# Patient Record
Sex: Female | Born: 1975 | Race: Black or African American | Hispanic: No | Marital: Single | Smoking: Former smoker
Health system: Southern US, Community
[De-identification: ages and names within clinical notes are randomized; demographics above are authoritative.]

## PROBLEM LIST (undated history)

## (undated) DIAGNOSIS — E669 Obesity, unspecified: Secondary | ICD-10-CM

## (undated) DIAGNOSIS — I10 Essential (primary) hypertension: Secondary | ICD-10-CM

## (undated) DIAGNOSIS — F329 Major depressive disorder, single episode, unspecified: Secondary | ICD-10-CM

## (undated) DIAGNOSIS — F32A Depression, unspecified: Secondary | ICD-10-CM

## (undated) DIAGNOSIS — F410 Panic disorder [episodic paroxysmal anxiety] without agoraphobia: Secondary | ICD-10-CM

## (undated) DIAGNOSIS — F319 Bipolar disorder, unspecified: Secondary | ICD-10-CM

## (undated) HISTORY — PX: SIGMOIDECTOMY: SHX176

## (undated) HISTORY — PX: TUBAL LIGATION: SHX77

---

## 2007-10-24 ENCOUNTER — Emergency Department (HOSPITAL_COMMUNITY): Admission: EM | Admit: 2007-10-24 | Discharge: 2007-10-25 | Payer: Self-pay | Admitting: Emergency Medicine

## 2008-04-03 ENCOUNTER — Inpatient Hospital Stay (HOSPITAL_COMMUNITY): Admission: EM | Admit: 2008-04-03 | Discharge: 2008-04-05 | Payer: Self-pay | Admitting: Emergency Medicine

## 2009-01-20 ENCOUNTER — Emergency Department (HOSPITAL_COMMUNITY): Admission: EM | Admit: 2009-01-20 | Discharge: 2009-01-21 | Payer: Self-pay | Admitting: Emergency Medicine

## 2009-07-11 ENCOUNTER — Emergency Department (HOSPITAL_COMMUNITY): Admission: EM | Admit: 2009-07-11 | Discharge: 2009-07-12 | Payer: Self-pay | Admitting: Emergency Medicine

## 2009-08-19 ENCOUNTER — Ambulatory Visit (HOSPITAL_COMMUNITY): Admission: RE | Admit: 2009-08-19 | Discharge: 2009-08-19 | Payer: Self-pay | Admitting: Obstetrics & Gynecology

## 2009-08-20 ENCOUNTER — Ambulatory Visit: Payer: Self-pay | Admitting: Family Medicine

## 2009-09-03 ENCOUNTER — Ambulatory Visit (HOSPITAL_COMMUNITY): Admission: RE | Admit: 2009-09-03 | Discharge: 2009-09-03 | Payer: Self-pay | Admitting: Obstetrics & Gynecology

## 2009-09-03 ENCOUNTER — Ambulatory Visit: Payer: Self-pay | Admitting: Obstetrics and Gynecology

## 2009-09-23 ENCOUNTER — Ambulatory Visit (HOSPITAL_COMMUNITY): Admission: RE | Admit: 2009-09-23 | Discharge: 2009-09-23 | Payer: Self-pay | Admitting: Obstetrics & Gynecology

## 2009-10-05 ENCOUNTER — Inpatient Hospital Stay (HOSPITAL_COMMUNITY): Admission: AD | Admit: 2009-10-05 | Discharge: 2009-10-05 | Payer: Self-pay | Admitting: Obstetrics & Gynecology

## 2009-10-08 ENCOUNTER — Ambulatory Visit: Payer: Self-pay | Admitting: Family Medicine

## 2009-10-22 ENCOUNTER — Ambulatory Visit (HOSPITAL_COMMUNITY): Admission: RE | Admit: 2009-10-22 | Discharge: 2009-10-22 | Payer: Self-pay | Admitting: Obstetrics & Gynecology

## 2009-10-22 ENCOUNTER — Ambulatory Visit: Payer: Self-pay | Admitting: Obstetrics & Gynecology

## 2009-10-22 LAB — CONVERTED CEMR LAB
AST: 14 units/L (ref 0–37)
Alkaline Phosphatase: 61 units/L (ref 39–117)
CO2: 23 meq/L (ref 19–32)
Creatinine Clearance: 244 mL/min — ABNORMAL HIGH (ref 75–115)
Creatinine, Ser: 0.56 mg/dL (ref 0.40–1.20)
Creatinine, Urine: 121.3 mg/dL
Glucose, Bld: 88 mg/dL (ref 70–99)
HCT: 33.2 % — ABNORMAL LOW (ref 36.0–46.0)
Hemoglobin: 11 g/dL — ABNORMAL LOW (ref 12.0–15.0)
MCHC: 33.1 g/dL (ref 30.0–36.0)
MCV: 95.4 fL (ref 78.0–100.0)
Protein, Ur: 228 mg/24hr — ABNORMAL HIGH (ref 50–100)
RBC: 3.48 M/uL — ABNORMAL LOW (ref 3.87–5.11)
Sodium: 138 meq/L (ref 135–145)
Total Bilirubin: 0.4 mg/dL (ref 0.3–1.2)
WBC: 11.7 10*3/uL — ABNORMAL HIGH (ref 4.0–10.5)

## 2009-11-19 ENCOUNTER — Ambulatory Visit: Payer: Self-pay | Admitting: Obstetrics & Gynecology

## 2009-11-19 ENCOUNTER — Ambulatory Visit (HOSPITAL_COMMUNITY): Admission: RE | Admit: 2009-11-19 | Discharge: 2009-11-19 | Payer: Self-pay | Admitting: Obstetrics & Gynecology

## 2009-11-26 ENCOUNTER — Ambulatory Visit: Payer: Self-pay | Admitting: Obstetrics & Gynecology

## 2009-11-26 LAB — CONVERTED CEMR LAB
HCT: 31.6 % — ABNORMAL LOW (ref 36.0–46.0)
Hemoglobin: 10.6 g/dL — ABNORMAL LOW (ref 12.0–15.0)
MCHC: 33.5 g/dL (ref 30.0–36.0)
RBC: 3.36 M/uL — ABNORMAL LOW (ref 3.87–5.11)
RDW: 14.4 % (ref 11.5–15.5)
WBC: 11.1 10*3/uL — ABNORMAL HIGH (ref 4.0–10.5)

## 2009-12-10 ENCOUNTER — Ambulatory Visit: Payer: Self-pay | Admitting: Family Medicine

## 2009-12-10 ENCOUNTER — Encounter: Payer: Self-pay | Admitting: Pulmonary Disease

## 2009-12-17 ENCOUNTER — Ambulatory Visit: Payer: Self-pay | Admitting: Obstetrics & Gynecology

## 2009-12-21 ENCOUNTER — Ambulatory Visit (HOSPITAL_COMMUNITY): Admission: RE | Admit: 2009-12-21 | Discharge: 2009-12-21 | Payer: Self-pay | Admitting: Obstetrics & Gynecology

## 2009-12-23 ENCOUNTER — Ambulatory Visit: Payer: Self-pay | Admitting: Pulmonary Disease

## 2009-12-23 DIAGNOSIS — I10 Essential (primary) hypertension: Secondary | ICD-10-CM | POA: Insufficient documentation

## 2009-12-23 DIAGNOSIS — J45909 Unspecified asthma, uncomplicated: Secondary | ICD-10-CM | POA: Insufficient documentation

## 2009-12-23 DIAGNOSIS — F319 Bipolar disorder, unspecified: Secondary | ICD-10-CM | POA: Insufficient documentation

## 2009-12-31 ENCOUNTER — Ambulatory Visit: Payer: Self-pay | Admitting: Family Medicine

## 2010-01-04 ENCOUNTER — Ambulatory Visit: Payer: Self-pay | Admitting: Obstetrics & Gynecology

## 2010-01-04 ENCOUNTER — Ambulatory Visit (HOSPITAL_COMMUNITY): Admission: RE | Admit: 2010-01-04 | Discharge: 2010-01-04 | Payer: Self-pay | Admitting: Family Medicine

## 2010-01-07 ENCOUNTER — Ambulatory Visit: Payer: Self-pay | Admitting: Obstetrics & Gynecology

## 2010-01-11 ENCOUNTER — Ambulatory Visit: Payer: Self-pay | Admitting: Obstetrics & Gynecology

## 2010-01-14 ENCOUNTER — Ambulatory Visit: Payer: Self-pay | Admitting: Obstetrics & Gynecology

## 2010-01-19 ENCOUNTER — Ambulatory Visit (HOSPITAL_COMMUNITY): Admission: RE | Admit: 2010-01-19 | Discharge: 2010-01-19 | Payer: Self-pay | Admitting: Family Medicine

## 2010-01-22 ENCOUNTER — Ambulatory Visit (HOSPITAL_BASED_OUTPATIENT_CLINIC_OR_DEPARTMENT_OTHER): Admission: RE | Admit: 2010-01-22 | Discharge: 2010-01-22 | Payer: Self-pay | Admitting: Pulmonary Disease

## 2010-01-22 ENCOUNTER — Ambulatory Visit: Payer: Self-pay | Admitting: Pulmonary Disease

## 2010-01-22 DIAGNOSIS — G4733 Obstructive sleep apnea (adult) (pediatric): Secondary | ICD-10-CM | POA: Insufficient documentation

## 2010-01-25 ENCOUNTER — Ambulatory Visit: Payer: Self-pay | Admitting: Obstetrics & Gynecology

## 2010-01-28 ENCOUNTER — Ambulatory Visit: Payer: Self-pay | Admitting: Obstetrics and Gynecology

## 2010-02-01 ENCOUNTER — Ambulatory Visit: Payer: Self-pay | Admitting: Obstetrics & Gynecology

## 2010-02-01 ENCOUNTER — Telehealth (INDEPENDENT_AMBULATORY_CARE_PROVIDER_SITE_OTHER): Payer: Self-pay | Admitting: *Deleted

## 2010-02-03 ENCOUNTER — Encounter: Payer: Self-pay | Admitting: Pulmonary Disease

## 2010-02-08 ENCOUNTER — Ambulatory Visit: Payer: Self-pay | Admitting: Pulmonary Disease

## 2010-02-09 ENCOUNTER — Ambulatory Visit: Payer: Self-pay | Admitting: Obstetrics and Gynecology

## 2010-02-11 ENCOUNTER — Encounter (INDEPENDENT_AMBULATORY_CARE_PROVIDER_SITE_OTHER): Payer: Self-pay | Admitting: Family Medicine

## 2010-02-11 ENCOUNTER — Ambulatory Visit: Payer: Self-pay | Admitting: Obstetrics and Gynecology

## 2010-02-11 LAB — CONVERTED CEMR LAB: Chlamydia, DNA Probe: NEGATIVE

## 2010-02-12 ENCOUNTER — Encounter (INDEPENDENT_AMBULATORY_CARE_PROVIDER_SITE_OTHER): Payer: Self-pay | Admitting: Family Medicine

## 2010-02-15 ENCOUNTER — Ambulatory Visit: Payer: Self-pay | Admitting: Obstetrics & Gynecology

## 2010-02-16 ENCOUNTER — Ambulatory Visit (HOSPITAL_COMMUNITY): Admission: RE | Admit: 2010-02-16 | Discharge: 2010-02-16 | Payer: Self-pay | Admitting: Family Medicine

## 2010-02-16 ENCOUNTER — Encounter: Payer: Self-pay | Admitting: Family Medicine

## 2010-02-17 ENCOUNTER — Encounter: Payer: Self-pay | Admitting: Pulmonary Disease

## 2010-02-18 ENCOUNTER — Ambulatory Visit: Payer: Self-pay | Admitting: Family Medicine

## 2010-02-20 ENCOUNTER — Inpatient Hospital Stay (HOSPITAL_COMMUNITY): Admission: AD | Admit: 2010-02-20 | Discharge: 2010-02-20 | Payer: Self-pay | Admitting: Obstetrics & Gynecology

## 2010-02-22 ENCOUNTER — Ambulatory Visit: Payer: Self-pay | Admitting: Obstetrics & Gynecology

## 2010-02-23 ENCOUNTER — Ambulatory Visit: Payer: Self-pay | Admitting: Obstetrics and Gynecology

## 2010-02-23 ENCOUNTER — Inpatient Hospital Stay (HOSPITAL_COMMUNITY): Admission: RE | Admit: 2010-02-23 | Discharge: 2010-02-26 | Payer: Self-pay | Admitting: Obstetrics & Gynecology

## 2010-02-24 ENCOUNTER — Encounter: Payer: Self-pay | Admitting: Obstetrics & Gynecology

## 2010-03-02 ENCOUNTER — Ambulatory Visit: Payer: Self-pay | Admitting: Advanced Practice Midwife

## 2010-03-02 ENCOUNTER — Inpatient Hospital Stay (HOSPITAL_COMMUNITY): Admission: AD | Admit: 2010-03-02 | Discharge: 2010-03-03 | Payer: Self-pay | Admitting: Obstetrics & Gynecology

## 2010-03-04 ENCOUNTER — Ambulatory Visit: Payer: Self-pay | Admitting: Family Medicine

## 2010-03-04 ENCOUNTER — Inpatient Hospital Stay (HOSPITAL_COMMUNITY): Admission: AD | Admit: 2010-03-04 | Discharge: 2010-03-04 | Payer: Self-pay | Admitting: Family Medicine

## 2010-03-08 ENCOUNTER — Encounter: Payer: Self-pay | Admitting: Pulmonary Disease

## 2010-04-12 ENCOUNTER — Telehealth: Payer: Self-pay | Admitting: Pulmonary Disease

## 2010-04-13 ENCOUNTER — Encounter: Payer: Self-pay | Admitting: Pulmonary Disease

## 2010-08-08 ENCOUNTER — Encounter: Payer: Self-pay | Admitting: *Deleted

## 2010-08-08 ENCOUNTER — Encounter: Payer: Self-pay | Admitting: Obstetrics & Gynecology

## 2010-08-17 NOTE — Miscellaneous (Signed)
Summary: CPAP download  Clinical Lists Changes Used on 11 of 15 nights with average 4hrs 8 min.  Average AHI 1.3.

## 2010-08-17 NOTE — Assessment & Plan Note (Signed)
Summary: rov for sleep per Prestina Raigoza/LC   Visit Type:  Follow-up Copy to:  [facility] Primary Provider/Referring Provider:  Dr. Jeri Cos  CC:  Patient is here for cpap follow-up. The patient is 38 weeks into her pregnancy and date for induction is August 9th. The patient says there was a problem with the cpap and DME company came out to adjust. .  History of Present Illness: 35 yo female in 86 week pregnancy with hypersomnia and OSA.  She had her sleep study on January 22, 2010: AHI 5, SpO2 nadir 88%.  REM AHI 33.6.  She has since been started on autoCPAP.  She has full face mask.  She has adjusted well to using CPAP, and feels more refreshed with better sleep.  She is not having much trouble with the mask.   Current Medications (verified): 1)  Labetalol Hcl 100 Mg Tabs (Labetalol Hcl) .... Take 1 Tablet By Mouth Two Times A Day 2)  Vinate Ic 162-115.2-1 Mg Caps (Prenat W/o A Vit-Fefum-Fepo-Fa) .... 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 None  Vital Signs:  Patient profile:   35 year old female Height:      63.25 inches (160.66 cm) Weight:      342 pounds (155.45 kg) BMI:     60.32 O2 Sat:      100 % on Room air Temp:     97.5 degrees F (36.39 degrees C) oral Pulse rate:   116 / minute BP sitting:   144 / 88  (left arm) Cuff size:   large  Vitals Entered By: Michel Bickers CMA (February 08, 2010 9:17 AM)  O2 Sat at Rest %:  100 O2 Flow:  Room air CC: Patient is here for cpap follow-up. The patient is 38 weeks into her pregnancy and date for induction is August 9th. The patient says there was a problem with the cpap and DME  company came out to adjust.    Physical Exam  General:  normal appearance, healthy appearing, and obese.   Nose:  no deformity, discharge, inflammation, or lesions Mouth:  MP3, no exudate Neck:  no JVD.   Lungs:  clear bilaterally to auscultation and percussion Heart:  regular rate and rhythm, S1, S2 without murmurs, rubs, gallops, or clicks Extremities:  minimal ankle edema Cervical Nodes:  no significant adenopathy   Impression & Recommendations:  Problem # 1:  OBSTRUCTIVE SLEEP APNEA (ICD-327.23)  I reviewed her sleep test with her.  She has done well with her initial set up of auto CPAP.  I reviewed the proper fit of her mask.  Explained the importance of using CPAP through the remainder of her pregnancy with regard to her blood pressure and concern about possible pre-eclampsia.  Will re-address her need for CPAP several weeks after her delivery.  Explained that she needs to try to use CPAP whenever she is asleep, including when she is taking a nap during the day.  Complete Medication List: 1)  Labetalol Hcl 100 Mg Tabs (Labetalol hcl) .... Take 1 tablet by mouth two times a day 2)  Vinate Ic 162-115.2-1 Mg Caps (Prenat w/o a vit-fefum-fepo-fa) .Marland KitchenMarland KitchenMarland Kitchen  Take 1 tablet by mouth once a day  Other Orders: Est. Patient Level III (60454)  Patient Instructions: 1)  Follow up in 6 to 8 weeks

## 2010-08-17 NOTE — Progress Notes (Signed)
Summary: OV re: cpap with Sood this week  ---- Converted from flag ---- ---- 01/29/2010 4:41 PM, Coralyn Helling MD wrote: Can you set Ms. Singleton up for an ROV with me toward end of next week.  She is [redacted] weeks pregnant and new CPAP set up.  I want to be able to speak to her before her delivery.  Thanks. ------------------------------  Phone Note Outgoing Call   Call placed by: Michel Bickers CMA,  February 01, 2010 10:52 AM Call placed to: Patient Summary of Call: Dr. Craige Cotta wants to see this patient this week re: cpap before she delivers. LMOMTCB. Initial call taken by: Michel Bickers CMA,  February 01, 2010 10:53 AM  Follow-up for Phone Call        [facility].Michel Bickers [facility]  February 02, 2010 3:32 PM  The patient has not received CPAP yet. The order was given to [facility] on 02/01/2010. I spoke with [facility] and asked that they get this setup for the pt ASAP. Once 2 week download is complete the patient will be approx [redacted] weeks pregnant. Please advise on what you would like to do. Patient is aware CPAP setup should be done in the next few days. Follow-up by: Michel Bickers CMA,  February 02, 2010 3:58 PM  Additional Follow-up for Phone Call Additional follow up Details #1::        Can you schedule for ROV next week with me.  Okay to overbook if needed. Additional Follow-up by: Coralyn Helling MD,  February 03, 2010 9:08 AM    Additional Follow-up for Phone Call Additional follow up Details #2::    LMOMTCB.Michel Bickers [facility]  February 03, 2010 9:36 AM  Patient is sch for Monday, 02/08/2010 @ 9am with VS. Patient is aware. Follow-up by: Michel Bickers CMA,  February 03, 2010 4:48 PM

## 2010-08-17 NOTE — Miscellaneous (Signed)
Summary: CPAP Set up info/[facility]  CPAP Set up info/[facility]   Imported By: Sherian Rein 02/11/2010 09:55:12  _____________________________________________________________________  External Attachment:    Type:   Image     Comment:   External Document

## 2010-08-17 NOTE — Assessment & Plan Note (Signed)
Summary: SLEEP AP- PT IS [redacted] WKS PREGNANT//kp   Visit Type:  Initial Consult Copy to:  [facility] Primary Provider/Referring Provider:  Dr. Jeri Cos  CC:  Pt c/o choking worse when sleeping x years. Pt is [redacted] weeks pregnant.  History of Present Illness: 35 yo female for sleep evaluation.  He has noticed problems with her sleep for years.  She also has a history of hypertension.  She is now [redacted] weeks pregnant, and her sleep has gotten worse during the pregnancy.  This is her second pregnancy.  She goes to bed at 11pm.  She was using trazodone prior to her pregnacy.  She is not using anything to help her sleep now.  She wakes up several times during the night with a choking sensation.  She gets out of bed at 730am, and still feels tired.  She does not usually get headaches in the morning.  She used to drink about one pot of coffee a day before she was pregnant.  She does snore, and her mother has told her that she stops breathing while asleep.  Both her parents have sleep apnea.  She gets a dry mouth at night, and can't sleep on her back because she gets choked.  She denies sleep walking, sleep talking, nightmares, or bruxism.  She denies restless legs.  There is no history of sleep hallucinations, sleep paralysis, or cataplexy.  She has gained about 25 lbs during her pregnancy.  Her blood pressure has been controlled during her pregnancy, and she has not had any other complications during her pregnancy.  She also has a history of bipolar disorder.  She was using seroquel and lamictal, but has stopped both of these since she became pregnant.  She is followed by Consuello Bossier with behavioral health.  Her Epworth score is 11 out of 24.  Preventive Screening-Counseling & Management  Alcohol-Tobacco     Smoking Status: quit     Packs/Day: 0.5     Year Started: 2007     Year Quit: 2011  Current Medications (verified): 1)  Labetalol Hcl 100 Mg Tabs (Labetalol Hcl) .... Take 1 Tablet  By Mouth Two Times A Day 2)  Vinate Ic 162-115.2-1 Mg Caps (Prenat W/o A Vit-Fefum-Fepo-Fa) .... 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 (quit 07-09-2009 1/2 ppd, started 2007) Smoking Status:  quit Packs/Day:  0.5  Review of Systems       The patient complains of shortness of breath at rest and hand/feet swelling.  The patient denies shortness of breath with activity, productive cough, non-productive cough, coughing up blood, chest pain, irregular heartbeats, acid heartburn, indigestion, loss of appetite, weight change, abdominal pain, difficulty swallowing, sore throat, tooth/dental problems, headaches, nasal congestion/difficulty breathing through nose, sneezing, itching, ear ache, anxiety, depression, joint stiffness or pain, rash, change in color of mucus, and fever.    Vital Signs:  Patient profile:   35 year old female Height:      63.25 inches Weight:      335 pounds BMI:     59.09 O2 Sat:      97 % on Room air Temp:     98.8 degrees  F oral Pulse rate:   108 / minute BP sitting:   122 / 78  (left arm) Cuff size:   large  Vitals Entered By: Zackery Barefoot CMA (December 23, 2009 2:21 PM)  O2 Flow:  Room air CC: Pt c/o choking worse when sleeping x years. Pt is [redacted] weeks pregnant Comments Medications reviewed with patient Verified contact number and pharmacy with patient Zackery Barefoot CMA  December 23, 2009 2:23 PM    Physical Exam  General:  normal appearance, healthy appearing, and obese.   Eyes:  EOMI.   Nose:  no deformity, discharge,  inflammation, or lesions Mouth:  MP3, no exudate Neck:  no JVD.   Chest Wall:  no deformities noted Lungs:  clear bilaterally to auscultation and percussion Heart:  regular rate and rhythm, S1, S2 without murmurs, rubs, gallops, or clicks Abdomen:  gravid uterus, non-tender, normal bowel sounds Msk:  no deformity or scoliosis noted with normal posture Pulses:  pulses normal Extremities:  no clubbing, cyanosis, edema, or deformity noted Neurologic:  CN II-XII grossly intact with normal reflexes, coordination, muscle strength and tone Cervical Nodes:  no significant adenopathy Psych:  alert and cooperative; normal mood and affect; normal attention span and concentration   Impression & Recommendations:  Problem # 1:  HYPERSOMNIA (ICD-780.54)  She has sleep disruption and excessive daytime sleepiness.  She has hypertension and bipolar disease.  She is in her 30th week of pregnancy.  I am concerned that she could have sleep apnea.  To further evaluate this I will arrange for an overnight polysomnogram.    I have explained to her how sleep apnea can affect her health, particularly with regard to her blood pressure and risk of pre-eclampsia during pregnancy.  Driving precautions were discussed.  Advised her how her weight is likely contributing to her sleep problems, and that this will need to be addressed further after her pregnancy.  Further recommendations to be made after review of her sleep test.  Medications Added to Medication List This Visit: 1)  Labetalol Hcl 100 Mg Tabs (Labetalol hcl) .... Take 1 tablet by mouth two times a day 2)  Vinate Ic 162-115.2-1 Mg Caps (Prenat w/o a vit-fefum-fepo-fa) .... Take 1 tablet by mouth once a day  Complete Medication List: 1)  Labetalol Hcl 100 Mg Tabs (Labetalol hcl) .... Take 1 tablet by mouth two times a day 2)  Vinate Ic 162-115.2-1 Mg Caps (Prenat w/o a vit-fefum-fepo-fa) .... Take 1 tablet by mouth once a day  Other  Orders: Consultation Level IV (16109) Sleep Disorder Referral (Sleep Disorder)  Patient Instructions: 1)  Will schedule sleep test 2)  Will call to schedule follow up after sleep test reviewed   Immunization History:  Influenza Immunization History:    Influenza:  historical (07/20/2009)

## 2010-08-17 NOTE — Miscellaneous (Signed)
Summary: Polysomnogram report  Clinical Lists Changes AHI 5, SpO2 nadir 88%.  REM AHI 33.6.  Pt is [redacted] weeks pregnant, has history of HTN, and Ob states concern for pre-eclampsia.  Will start her on empiric auto CPAP to get her through her pregnancy, and then re-assess need for CPAP after her delivery.  Plan d/w pt over the phone. Problems: Added new problem of OBSTRUCTIVE SLEEP APNEA (ICD-327.23) Orders: Added new Referral order of DME Referral (DME) - Signed

## 2010-08-17 NOTE — Letter (Signed)
Summary: [facility]   Imported By: Sherian Rein 12/28/2009 10:35:01  _____________________________________________________________________  External Attachment:    Type:   Image     Comment:   External Document

## 2010-08-17 NOTE — Progress Notes (Signed)
Summary: nos appt  Phone Note Call from Patient   Caller: [email or web address]  Call For: sood Summary of Call: LMTCB x2 to rsc nos from 9/22. Initial call taken by: Darletta Moll,  April 12, 2010 11:05 AM

## 2010-10-01 LAB — URINALYSIS, ROUTINE W REFLEX MICROSCOPIC
Bilirubin Urine: NEGATIVE
Bilirubin Urine: NEGATIVE
Bilirubin Urine: NEGATIVE
Glucose, UA: NEGATIVE mg/dL
Glucose, UA: NEGATIVE mg/dL
Glucose, UA: NEGATIVE mg/dL
Hgb urine dipstick: NEGATIVE
Ketones, ur: 15 mg/dL — AB
Ketones, ur: NEGATIVE mg/dL
Leukocytes, UA: NEGATIVE
Nitrite: NEGATIVE
Protein, ur: 100 mg/dL — AB
Protein, ur: NEGATIVE mg/dL
Specific Gravity, Urine: 1.02 (ref 1.005–1.030)
Specific Gravity, Urine: >1.03 — ABNORMAL HIGH (ref 1.005–1.030)
Specific Gravity, Urine: >1.03 — ABNORMAL HIGH (ref 1.005–1.030)
Urobilinogen, UA: 0.2 mg/dL (ref 0.0–1.0)
pH: 6 (ref 5.0–8.0)
pH: 6 (ref 5.0–8.0)

## 2010-10-01 LAB — CBC
HCT: 28.8 % — ABNORMAL LOW (ref 36.0–46.0)
HCT: 27 % — ABNORMAL LOW (ref 36.0–46.0)
HCT: 32.6 % — ABNORMAL LOW (ref 36.0–46.0)
Hemoglobin: 9.4 g/dL — ABNORMAL LOW (ref 12.0–15.0)
Hemoglobin: 11.2 g/dL — ABNORMAL LOW (ref 12.0–15.0)
Hemoglobin: 9.4 g/dL — ABNORMAL LOW (ref 12.0–15.0)
MCH: 32.6 pg (ref 26.0–34.0)
MCH: 33.4 pg (ref 26.0–34.0)
MCHC: 34.7 g/dL (ref 30.0–36.0)
MCV: 95.1 fL (ref 78.0–100.0)
RBC: 2.98 MIL/uL — ABNORMAL LOW (ref 3.87–5.11)
RBC: 2.8 MIL/uL — ABNORMAL LOW (ref 3.87–5.11)
RBC: 3.43 MIL/uL — ABNORMAL LOW (ref 3.87–5.11)
WBC: 6.6 10*3/uL (ref 4.0–10.5)
WBC: 8.2 10*3/uL (ref 4.0–10.5)

## 2010-10-01 LAB — POCT URINALYSIS DIPSTICK
Glucose, UA: NEGATIVE mg/dL
Hgb urine dipstick: NEGATIVE
Nitrite: NEGATIVE
Specific Gravity, Urine: 1.025 (ref 1.005–1.030)
pH: 5.5 (ref 5.0–8.0)

## 2010-10-01 LAB — DIFFERENTIAL
Basophils Relative: 0 % (ref 0–1)
Eosinophils Absolute: 0.2 10*3/uL (ref 0.0–0.7)
Eosinophils Relative: 3 % (ref 0–5)
Neutrophils Relative %: 60 % (ref 43–77)

## 2010-10-01 LAB — URINE MICROSCOPIC-ADD ON

## 2010-10-01 LAB — COMPREHENSIVE METABOLIC PANEL
ALT: 25 U/L (ref 0–35)
AST: 21 U/L (ref 0–37)
Alkaline Phosphatase: 76 U/L (ref 39–117)
CO2: 25 mEq/L (ref 19–32)
Chloride: 110 mEq/L (ref 96–112)
GFR calc Af Amer: >60 mL/min (ref >60)
GFR calc non Af Amer: >60 mL/min (ref >60)
Glucose, Bld: 85 mg/dL (ref 70–99)
Potassium: 3.9 mEq/L (ref 3.5–5.1)
Sodium: 142 mEq/L (ref 135–145)

## 2010-10-03 LAB — POCT URINALYSIS DIP (DEVICE)
Bilirubin Urine: NEGATIVE
Glucose, UA: NEGATIVE mg/dL
Hgb urine dipstick: NEGATIVE
Hgb urine dipstick: NEGATIVE
Ketones, ur: NEGATIVE mg/dL
Nitrite: NEGATIVE
Nitrite: NEGATIVE
Protein, ur: NEGATIVE mg/dL
Specific Gravity, Urine: >=1.03 (ref 1.005–1.030)
Urobilinogen, UA: 0.2 mg/dL (ref 0.0–1.0)
pH: 5.5 (ref 5.0–8.0)
pH: 6.5 (ref 5.0–8.0)

## 2010-10-03 IMAGING — US US OB DETAIL+14 WK
1 series · 14 of 28 positions shown · non-contrast
Comparison: none

OBSTETRICAL ULTRASOUND:
 This ultrasound was performed in The [HOSPITAL], and the [facility] report will be stored to [REDACTED] PACS.  This report is also available in [HOSPITAL]?s accessANYware.

[Series 1: us ob detail+14 wk · 14 of 89 slices shown]
[im 4/89]
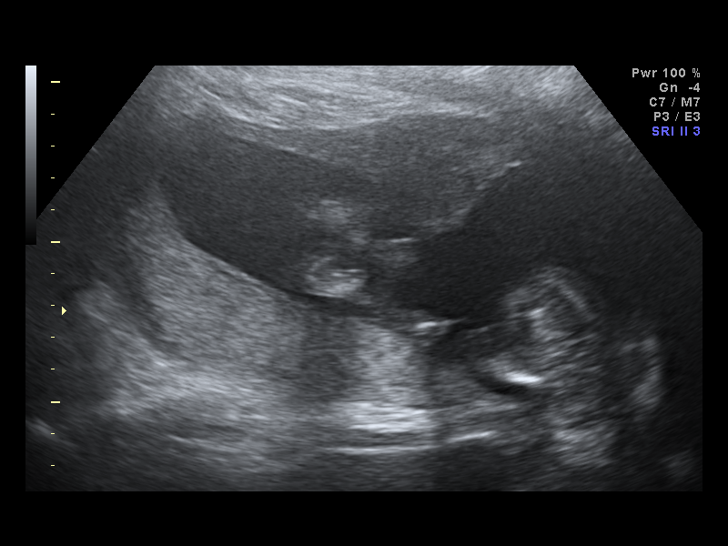
[im 10/89]
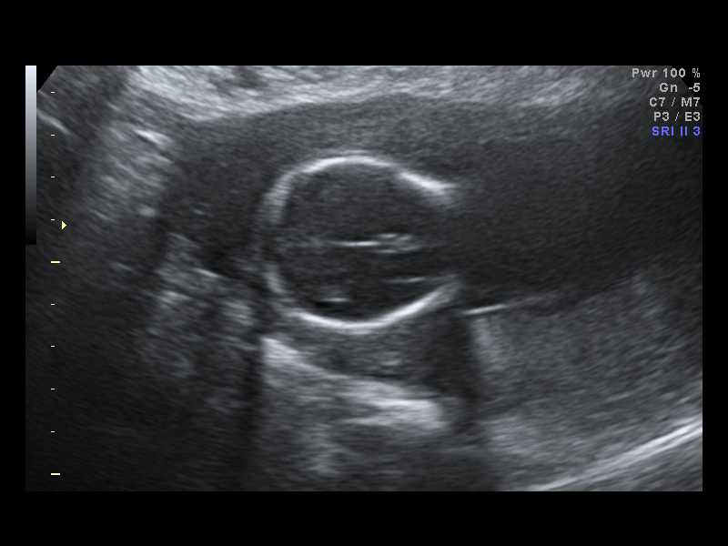
[im 17/89]
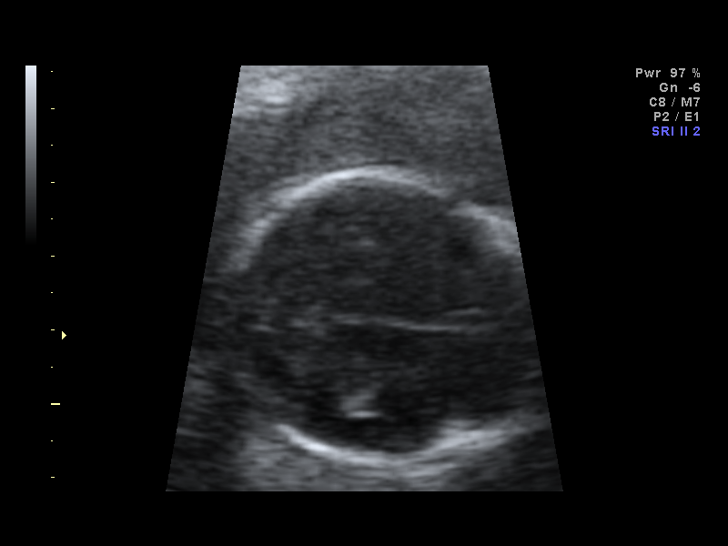
[im 23/89]
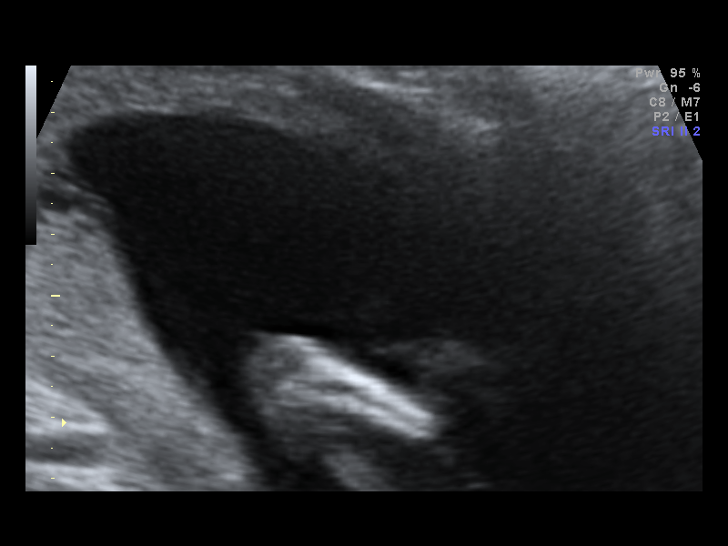
[im 30/89]
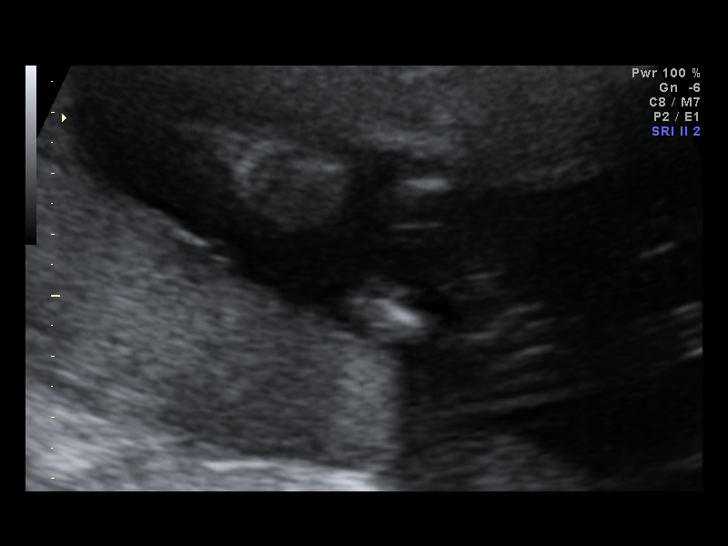
[im 36/89]
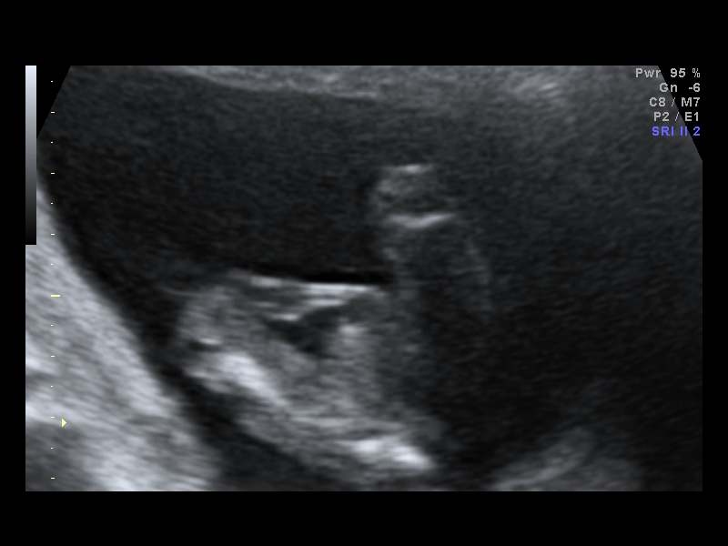
[im 43/89]
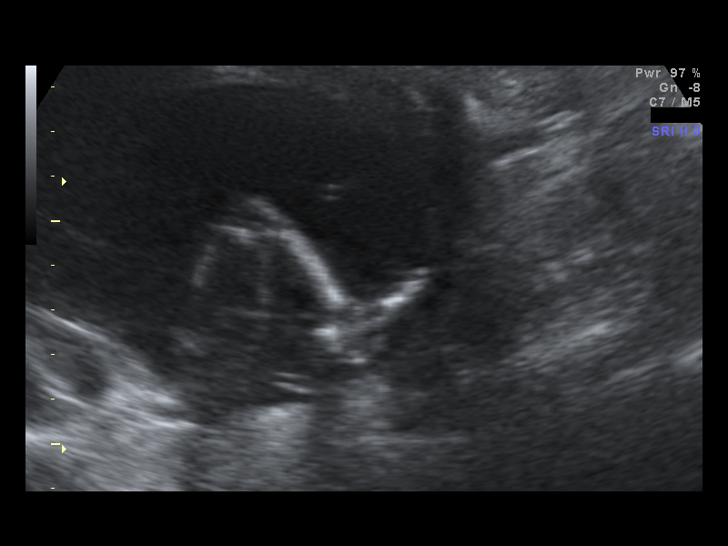
[im 49/89]
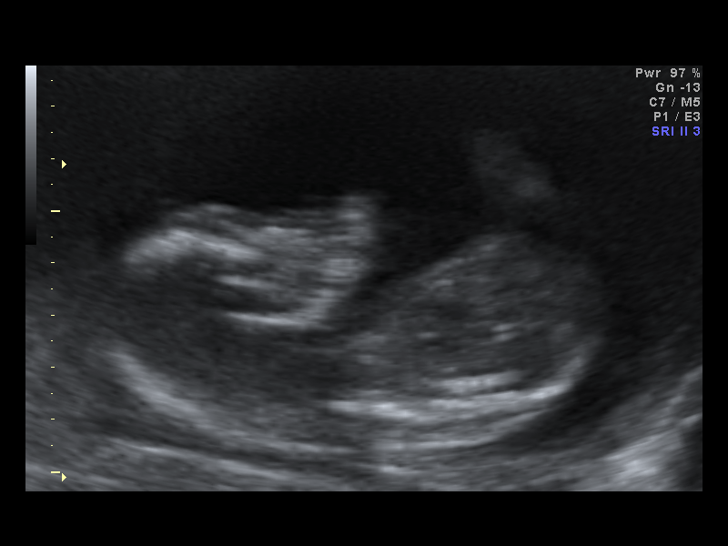
[im 56/89]
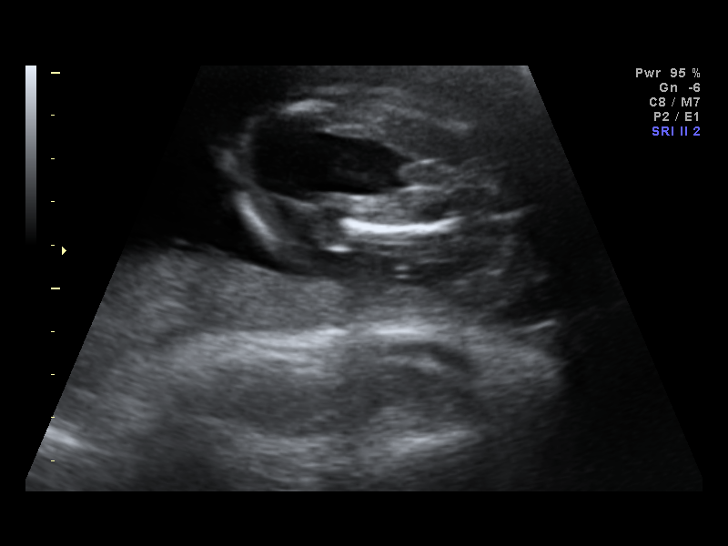
[im 62/89]
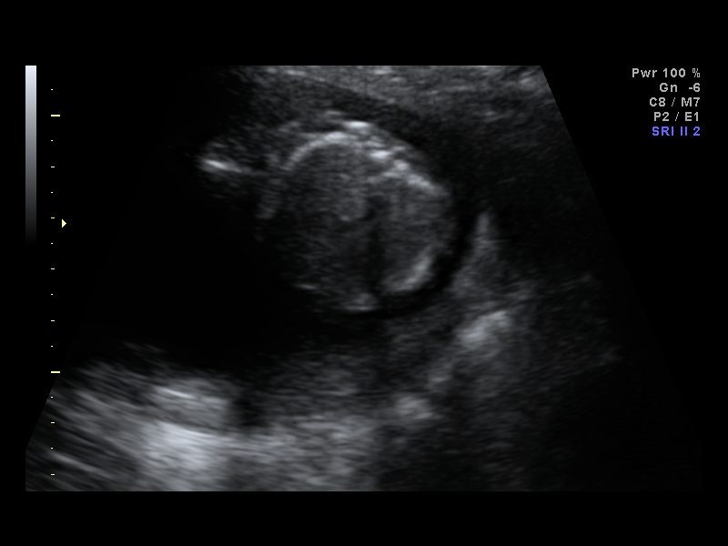
[im 69/89]
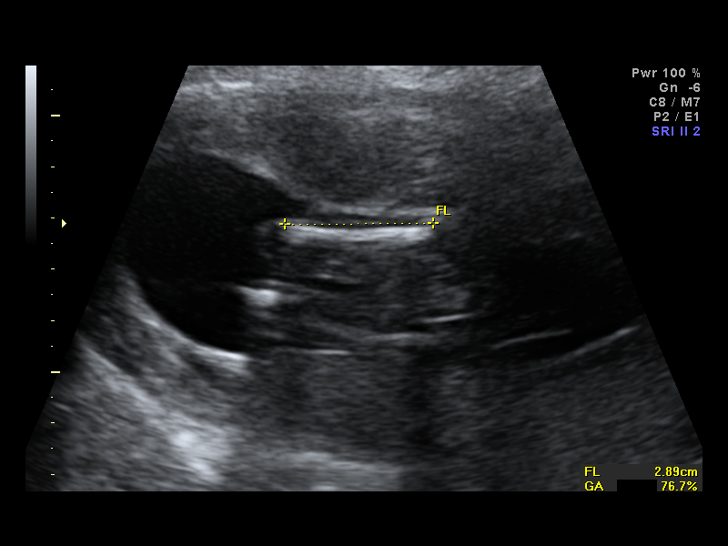
[im 75/89]
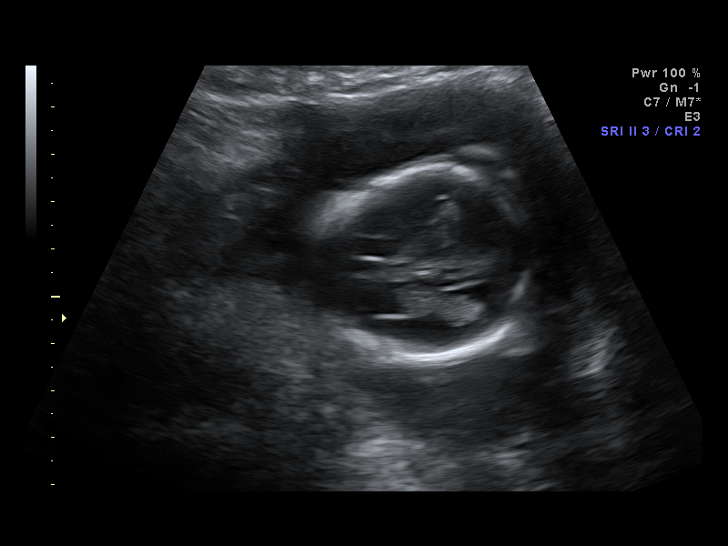
[im 82/89]
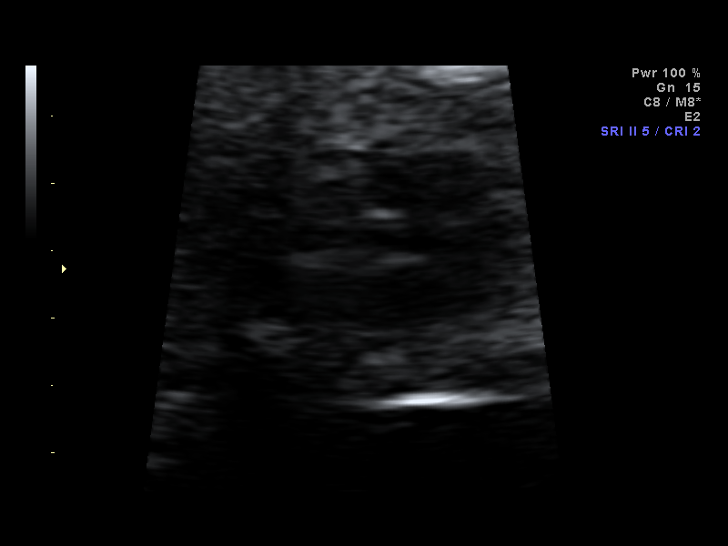
[im 89/89]
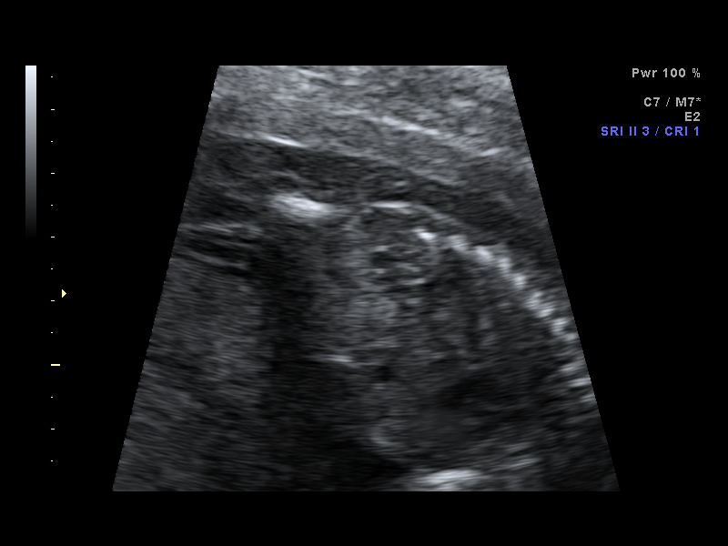

[14 of 28 positions shown; findings below may reference images not displayed]

IMPRESSION: [facility] has also been faxed to the ordering physician.

## 2010-10-04 LAB — POCT URINALYSIS DIP (DEVICE)
Ketones, ur: NEGATIVE mg/dL
Protein, ur: 30 mg/dL — AB
Specific Gravity, Urine: 1.025 (ref 1.005–1.030)
Urobilinogen, UA: 0.2 mg/dL (ref 0.0–1.0)
pH: 5.5 (ref 5.0–8.0)

## 2010-10-05 LAB — POCT URINALYSIS DIP (DEVICE)
Hgb urine dipstick: NEGATIVE
Hgb urine dipstick: NEGATIVE
Nitrite: NEGATIVE
Nitrite: NEGATIVE
Protein, ur: NEGATIVE mg/dL
Protein, ur: NEGATIVE mg/dL
Specific Gravity, Urine: <=1.005 (ref 1.005–1.030)
Urobilinogen, UA: 0.2 mg/dL (ref 0.0–1.0)
Urobilinogen, UA: 0.2 mg/dL (ref 0.0–1.0)
pH: 6 (ref 5.0–8.0)

## 2010-10-06 LAB — POCT URINALYSIS DIP (DEVICE)
Nitrite: NEGATIVE
Protein, ur: NEGATIVE mg/dL
Urobilinogen, UA: 0.2 mg/dL (ref 0.0–1.0)
pH: 7 (ref 5.0–8.0)

## 2010-10-07 LAB — POCT URINALYSIS DIP (DEVICE)
Glucose, UA: NEGATIVE mg/dL
Hgb urine dipstick: NEGATIVE
Nitrite: NEGATIVE
Nitrite: NEGATIVE
Protein, ur: NEGATIVE mg/dL
Specific Gravity, Urine: >=1.03 (ref 1.005–1.030)
Urobilinogen, UA: 0.2 mg/dL (ref 0.0–1.0)
Urobilinogen, UA: 0.2 mg/dL (ref 0.0–1.0)

## 2010-10-11 LAB — URINALYSIS, ROUTINE W REFLEX MICROSCOPIC
Bilirubin Urine: NEGATIVE
Hgb urine dipstick: NEGATIVE
Protein, ur: NEGATIVE mg/dL
Urobilinogen, UA: 0.2 mg/dL (ref 0.0–1.0)

## 2010-10-11 LAB — POCT URINALYSIS DIP (DEVICE)
Glucose, UA: NEGATIVE mg/dL
Hgb urine dipstick: NEGATIVE
Nitrite: POSITIVE — AB
Urobilinogen, UA: 1 mg/dL (ref 0.0–1.0)
pH: 5.5 (ref 5.0–8.0)

## 2010-10-18 LAB — COMPREHENSIVE METABOLIC PANEL
ALT: 14 U/L (ref 0–35)
AST: 17 U/L (ref 0–37)
CO2: 21 mEq/L (ref 19–32)
Calcium: 8.4 mg/dL (ref 8.4–10.5)
Chloride: 105 mEq/L (ref 96–112)
GFR calc Af Amer: >60 mL/min (ref >60)
GFR calc non Af Amer: >60 mL/min (ref >60)
Sodium: 135 mEq/L (ref 135–145)
Total Bilirubin: 0.5 mg/dL (ref 0.3–1.2)

## 2010-10-18 LAB — DIFFERENTIAL
Eosinophils Absolute: 0.3 10*3/uL (ref 0.0–0.7)
Eosinophils Relative: 4 % (ref 0–5)
Lymphs Abs: 2.9 10*3/uL (ref 0.7–4.0)
Monocytes Absolute: 0.8 10*3/uL (ref 0.1–1.0)

## 2010-10-18 LAB — URINALYSIS, ROUTINE W REFLEX MICROSCOPIC
Bilirubin Urine: NEGATIVE
Hgb urine dipstick: NEGATIVE
Protein, ur: NEGATIVE mg/dL
Urobilinogen, UA: 1 mg/dL (ref 0.0–1.0)

## 2010-10-18 LAB — WET PREP, GENITAL

## 2010-10-18 LAB — CBC
RBC: 3.46 MIL/uL — ABNORMAL LOW (ref 3.87–5.11)
WBC: 10 10*3/uL (ref 4.0–10.5)

## 2010-10-18 LAB — GC/CHLAMYDIA PROBE AMP, GENITAL: GC Probe Amp, Genital: NEGATIVE

## 2010-10-18 LAB — HCG, QUANTITATIVE, PREGNANCY: hCG, Beta Chain, Quant, S: 34080 m[IU]/mL — ABNORMAL HIGH (ref <5)

## 2010-11-30 NOTE — H&P (Signed)
NAME:  Paula Fitzgerald, Paula Fitzgerald NO.:  [id number]   MEDICAL RECORD NO.:  [id number]          PATIENT TYPE:  INP   LOCATION:  1440                         [facility]ACILITY[facility]  [facility]   PHYSICIAN:  Eduard Clos, MDDATE OF BIRTH:  04-11-1976   DATE OF ADMISSION:  04/03/2008  DATE OF DISCHARGE:                              HISTORY & PHYSICAL   PRIMARY CARE PHYSICIAN:  Unassigned.   CHIEF COMPLAINT:  Shortness of breath.   HISTORY OF PRESENT ILLNESS:  A 35 year old female with history of  bronchial asthma, present complaining of increasing shortness of breath  over the last 3 days.  The patient has also been experiencing chest  pain, particularly when she coughs or takes a deep breath.  It is  retrosternal, sometimes leads to the back.  No related to exertion.  In  the ER, the patient had a chest x-ray which showed right lower lobe  infiltrate.  The patient has been given multiple doses of nebulizers  despite which the patient's shortness is not improving.  Has been  admitted for further management.  The patient denies any dizziness, loss  of consciousness, diaphoresis.  Does have some nausea, initially had  some vague abdominal pain which has resolved.  Denies any dysuria,  discharges, diarrhea, weakness of limbs.   PAST MEDICAL HISTORY:  Bronchial asthma.   PAST SURGICAL HISTORY:  Right side fallopian tube removal for tubal  pregnancy.   MEDICATION PRIOR TO ADMISSION:  The patient takes Flovent, albuterol as  needed.   ALLERGIES:  NO KNOWN DRUG ALLERGIES.   FAMILY HISTORY:  Nothing contributory.   SOCIAL HISTORY:  The patient denies smoking cigarettes, drinks alcohol  occasionally.  Denies any drug abuse.   REVIEW OF SYSTEMS:  As per history of present illness.  Nothing else  significant.   PHYSICAL EXAMINATION:  GENERAL:  The patient examined at bedside.  Not  in acute distress.  VITAL SIGNS:  Blood pressure 178/95, pulse 90 per minute, temperature  99.6,  respirations 24, O2 sat 95%.  HEENT:  Anicteric.  No pallor.  CHEST:  Bilateral air entry present, bilateral expiratory wheezes heard.  No crepitation.  HEART:  S1, S2 heard.  ABDOMEN:  Soft.  Nontender, bowel sounds heard, no guarding, no  rigidity.  CNS:  The patient is alert and oriented to time, place and person.  Moves upper and lower extremities 5/5.  EXTREMITIES:  Peripheral pulses felt.  No edema.   LABORATORY DATA:  Chest x-ray, right lower lobe infiltrate.  EKG:  Normal sinus rhythm with heart rate around 70 per minute with no acute  ST-T changes.  CBC:  WBC 6.8, hemoglobin 13.5, hematocrit 39.6,  platelets 212, neutrophils 71%.  Basic metabolic panel:  Sodium 139,  potassium 3.7, chloride 107, carbon dioxide 23, glucose 90, BUN 5,  creatinine 0.7, calcium 8.8.  Abuse screen is negative.  UA is negative  for nitrites, leukocytes, ketones.   ASSESSMENT:  1. Right lower lobe pneumonia.  2. Atypical chest pain, more likely pleuritic type.  3. Exacerbation of bronchial asthma.  4. Obesity.   PLAN:  Admit the patient to telemetry, cycle  cardiac markers, obtain  blood cultures, urine cultures, empiric antibiotic, IV Solu-Medrol CT  angio of the chest to rule out any embolism or infection.  Further  recommendations as the patient's condition evolves.      Eduard Clos, MD  Electronically Signed     ANK/MEDQ  D:  04/03/2008  T:  04/03/2008  Job:  [id number]

## 2010-11-30 NOTE — Discharge Summary (Signed)
NAME:  LORRE, OPDAHL NO.:  [id number]   MEDICAL RECORD NO.:  [id number]          PATIENT TYPE:  INP   LOCATION:                               FACILITY:  [facility]   PHYSICIAN:  Beckey Rutter, MD  DATE OF BIRTH:  [birth year]   DATE OF ADMISSION:  04/03/2008  DATE OF DISCHARGE:  04/05/2008                               DISCHARGE SUMMARY   PRIMARY CARE PHYSICIAN:  Unassigned.  The patient is not seeing a  physician.   CHIEF COMPLAINT:  Shortness of breath.   BRIEF HISTORY OF PRESENT ILLNESS:  This is a 35 year old with history of  bronchial asthma, presented to the ED complaining of shortness of  breath.   HOSPITAL COURSE:  During her hospital course, the patient was started on  antibiotic with Zithromax and Rocephin and Tamiflu.  The patient's  condition improved.  In fact, the patient did not have any fever during  the hospital stay.  Clinically the patient had wheezes all through the  hospital course, but today examination was better without any wheezes on  auscultation.  The patient is stable to be discharged today, as  discussed with her.   DISCHARGE DIAGNOSES:  1. Bronchial asthma.  2. Bronchitis/bronchopneumonia.  3. Morbid obesity.   HOSPITAL PROCEDURES:  Chest x-ray done on April 02, 2008, on the day  of admission, showing right lower lobe pneumonia.  On April 03, 2008, the patient had CT angiogram to rule out PE.  The impression was  showing lower lobe artifact due to respiratory motion.  No strong  evidence of acute pulmonary embolism.  Normal visualized aorta.  Diffuse  bilateral bronchoalveolar ground glass opacities indicate nonspecific  alveolitis.  Favor infection given the associated mild hilar  lymphadenopathy.  Noninfectious etiologies such as hypersensitivity  pneumonitis might also have this appearance.   DISCHARGE MEDICATIONS:  The patient will be discharged on:  1. Augmentin 875 mg by mouth b.i.d. for 7 days, 14 caps were     prescribed.  2. Albuterol MDI 1 puff q.4 hours p.r.n.  3. Prednisone 30 mg by mouth daily for 2 days and then tapering dose      every 2 days.   DISCHARGE/PLAN:  The patient is stable for discharge today.  The patient  had an appointment with [facility], I suspect on September 30 at  4:00 p.m. with Dr. Alfonse Ras.  The importance of follow up to ensure the  clearance of this infection is discussed with her, and she is aware and  agreeable to the discharge plan.   I suspect the patient would need primary care from now on secondary to  the other morbidities including the obesity.      Beckey Rutter, MD  Electronically Signed     EME/MEDQ  D:  04/05/2008  T:  04/07/2008  Job:  [id number]

## 2010-12-31 IMAGING — US US OB FOLLOW-UP
1 series · 14 of 28 positions shown · non-contrast
Comparison: none

OBSTETRICAL ULTRASOUND:
 This ultrasound was performed in The [HOSPITAL], and the [facility] report will be stored to [REDACTED] PACS.  This report is also available in [HOSPITAL]?s accessANYware.

[Series 1: us ob follow-up · 35 acquisitions, 14 frames shown]
[im 2/35]
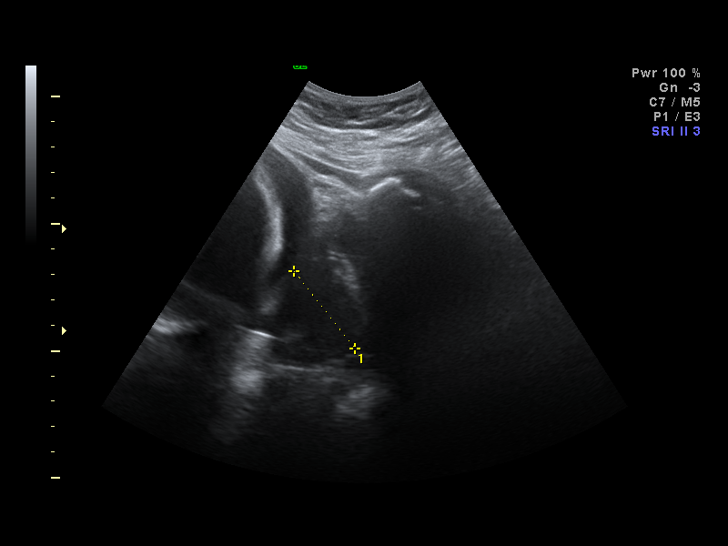
[im 4/35]
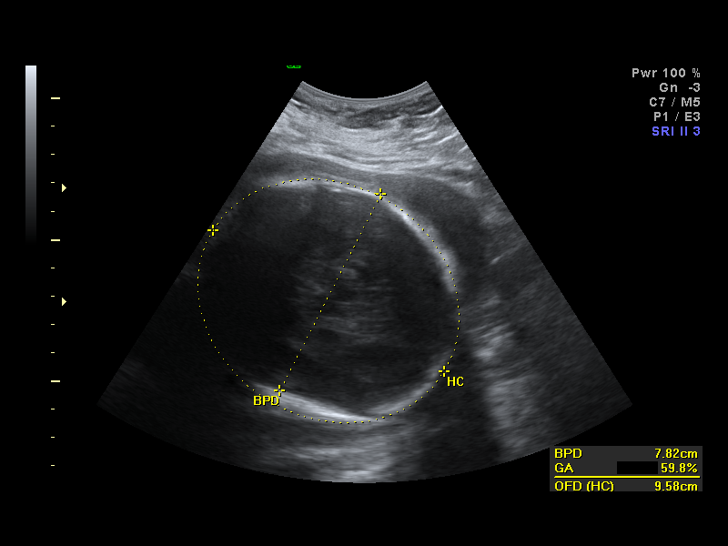
[im 7/35]
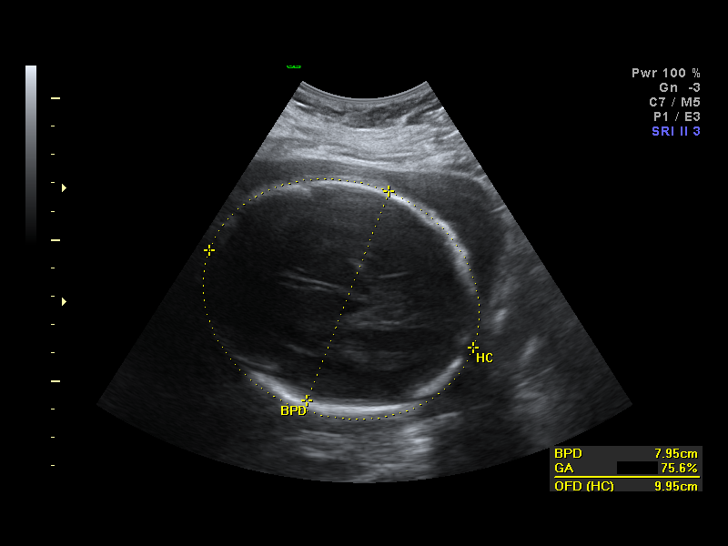
[im 9/35]
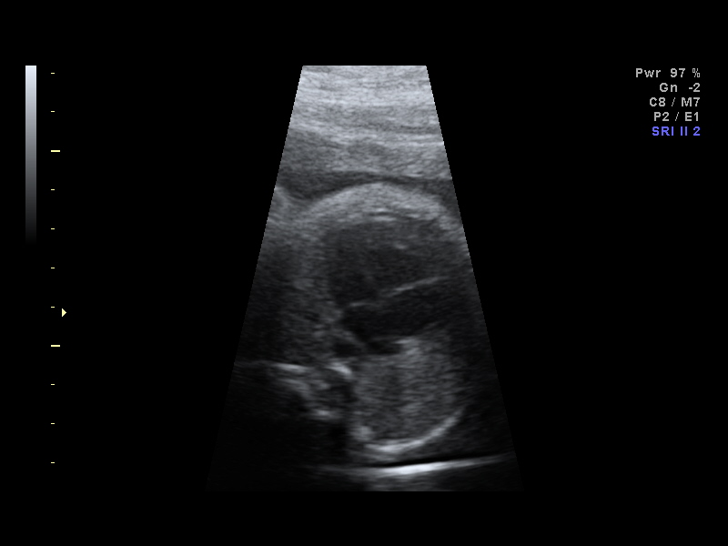
[im 12/35]
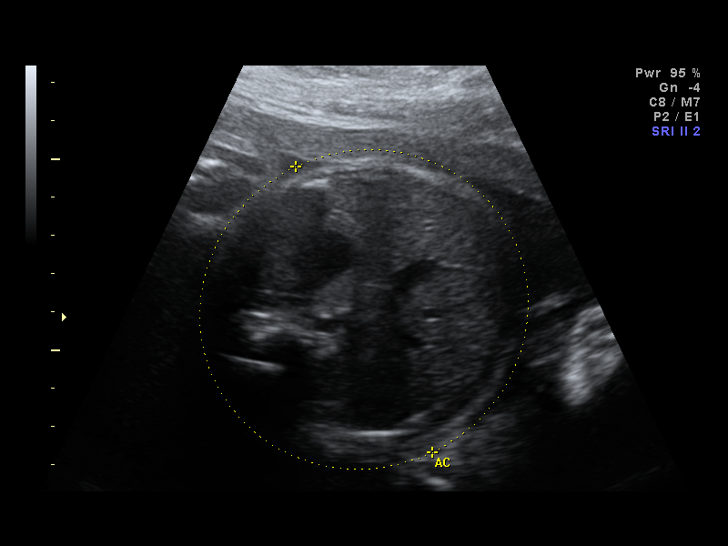
[im 14/35]
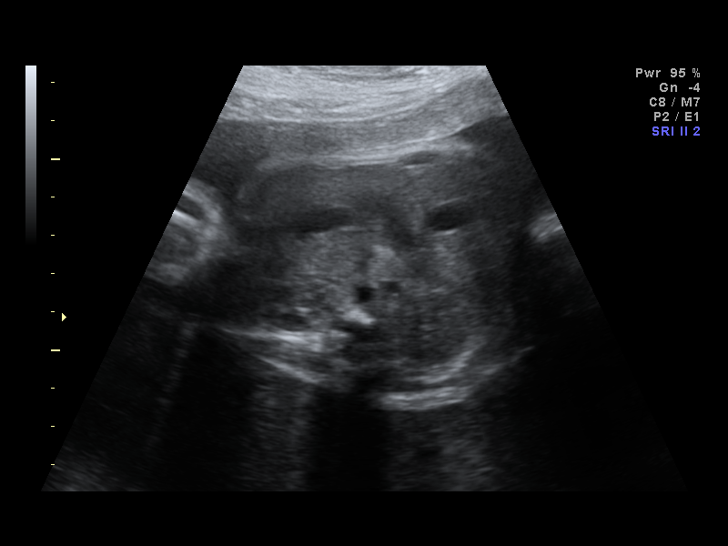
[im 17/35]
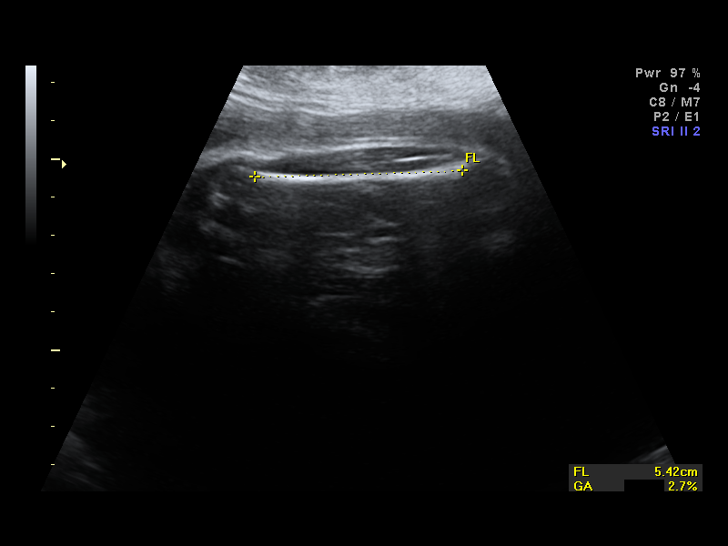
[im 19/35]
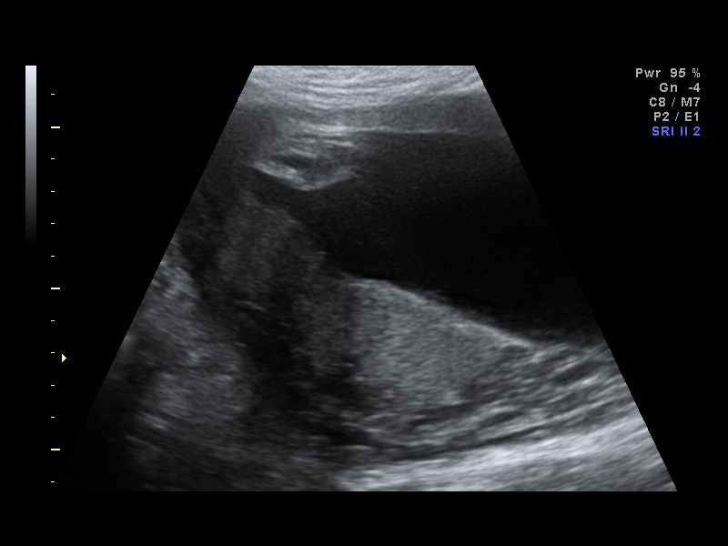
[im 22/35]
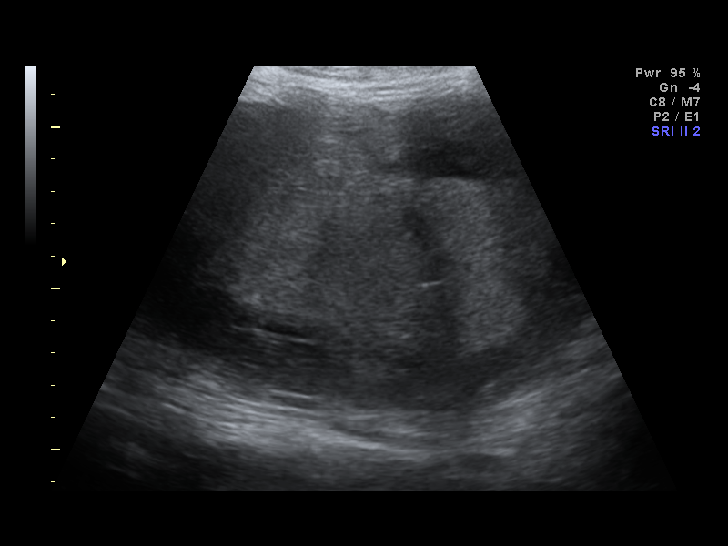
[im 24/35]
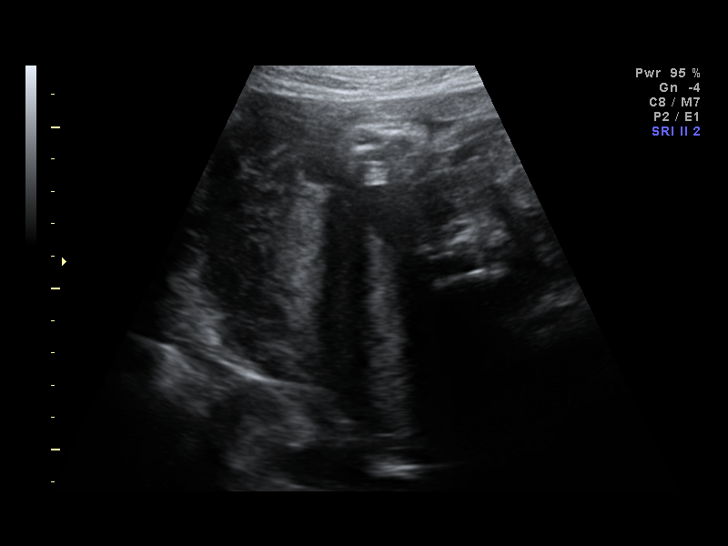
[im 27/35]
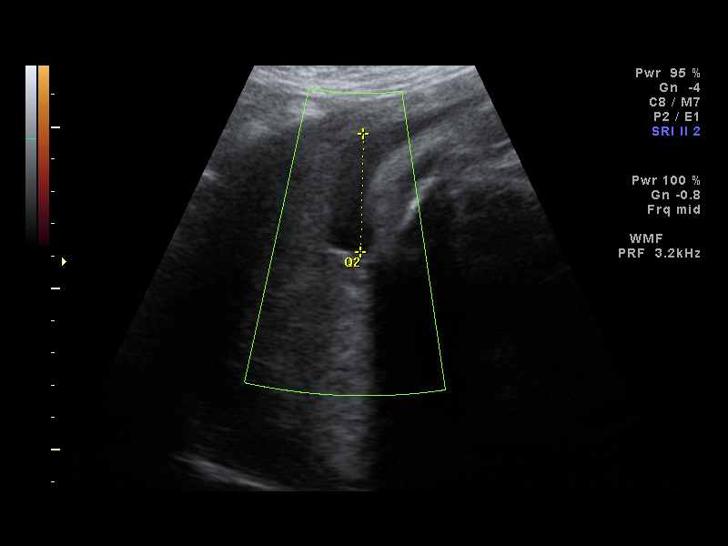
[im 29/35]
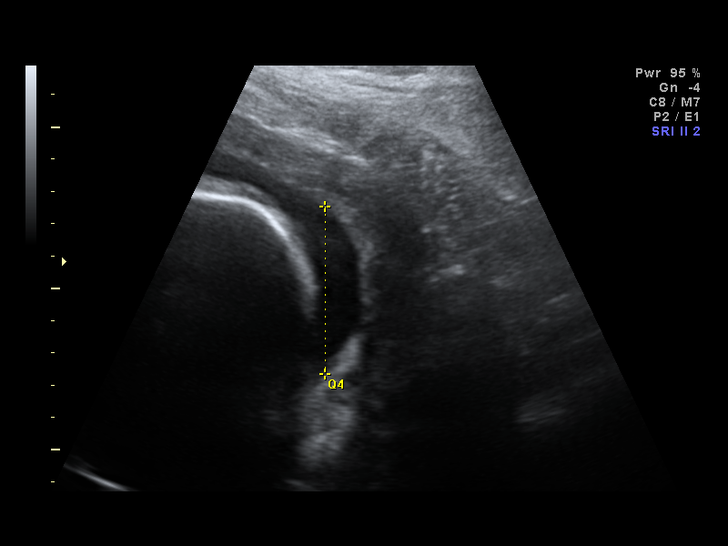
[im 32/35]
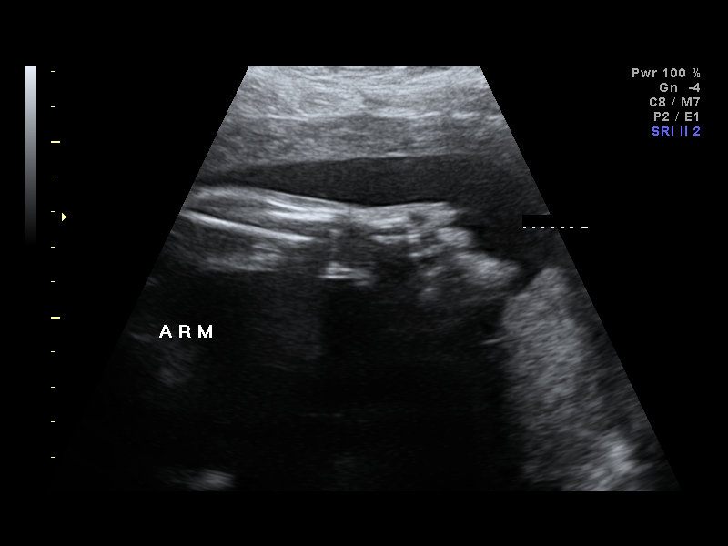
[im 35/35]
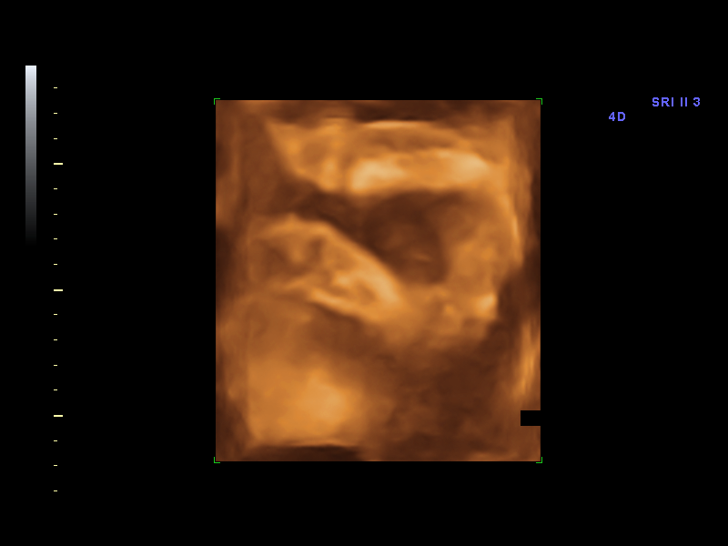

[14 of 28 positions shown; findings below may reference images not displayed]

IMPRESSION: [facility] has also been faxed to the ordering physician.

## 2011-01-29 IMAGING — US US OB FOLLOW-UP
1 series · 18 of 20 positions shown · non-contrast
Comparison: none

OBSTETRICAL ULTRASOUND:
 This ultrasound was performed in The [HOSPITAL], and the [facility] report will be stored to [REDACTED] PACS.  This report is also available in [HOSPITAL]?s accessANYware.

[Series 1: us ob follow-up · 20 acquisitions, 18 frames shown]
[im 1/20]
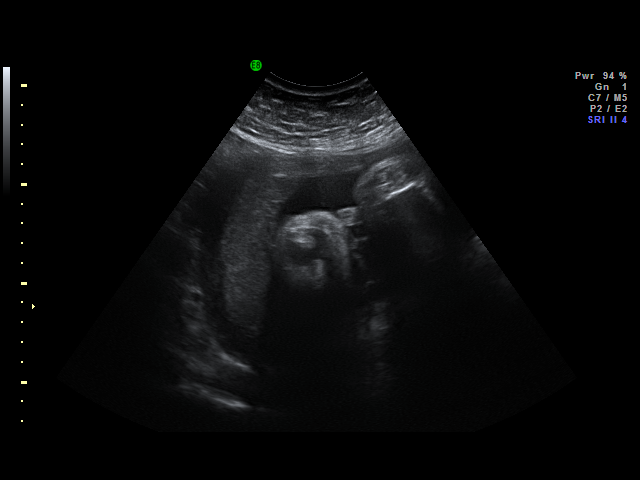
[im 2/20]
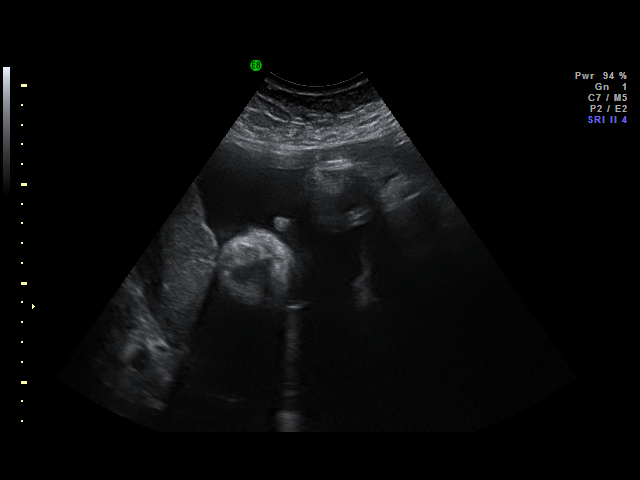
[im 3/20]
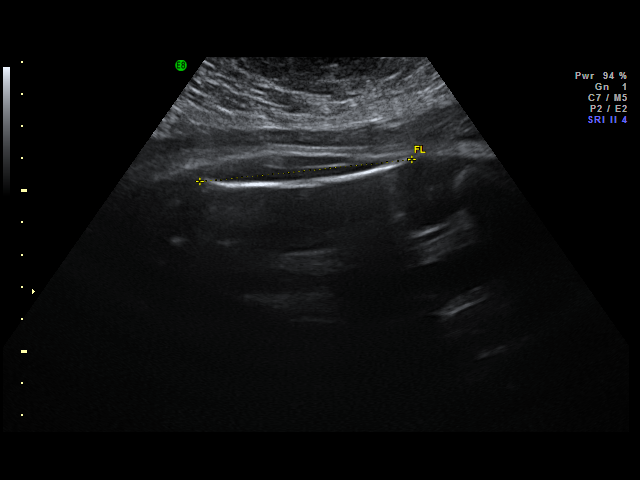
[im 4/20]
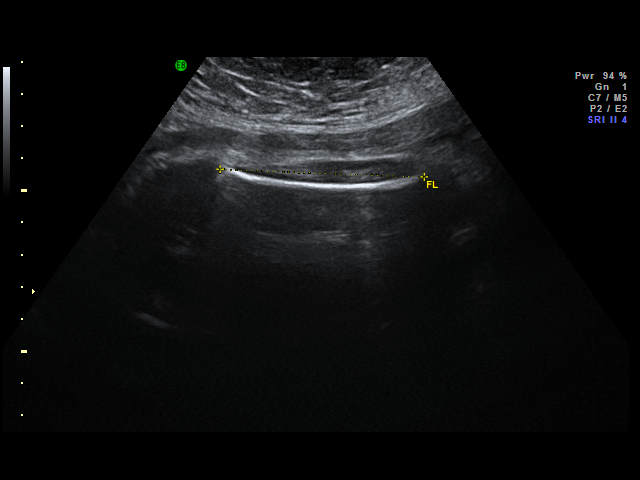
[im 6/20]
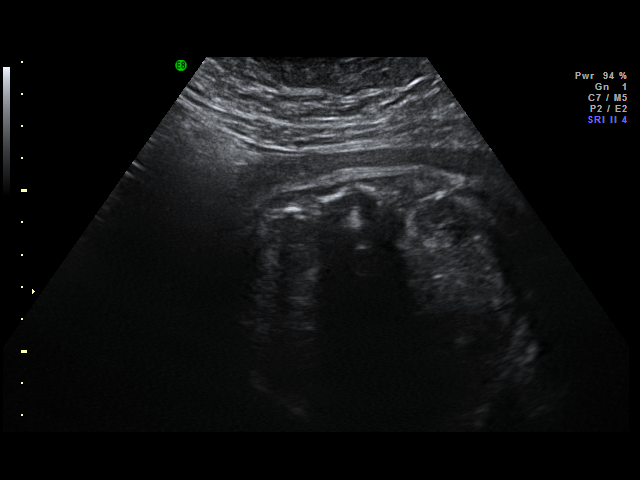
[im 7/20]
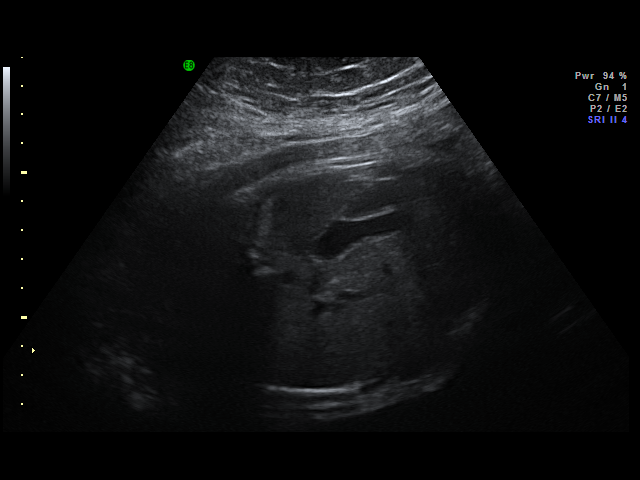
[im 8/20]
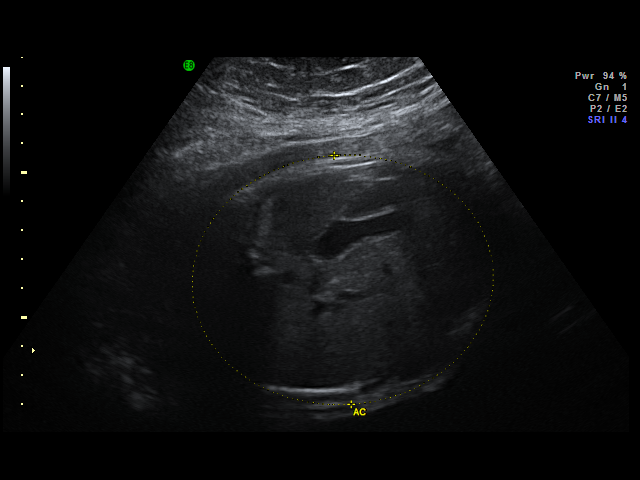
[im 9/20]
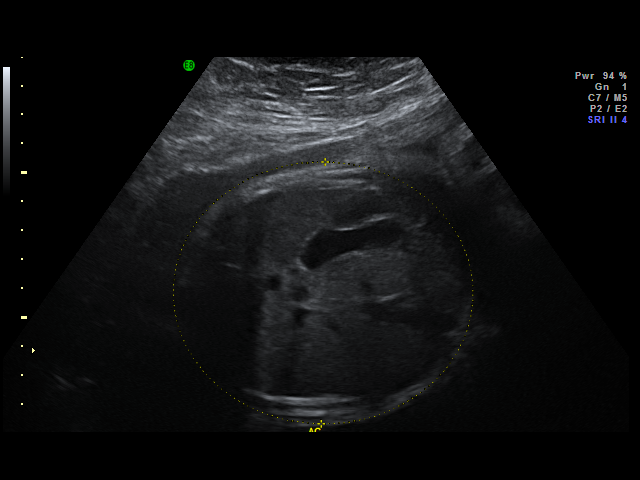
[im 10/20]
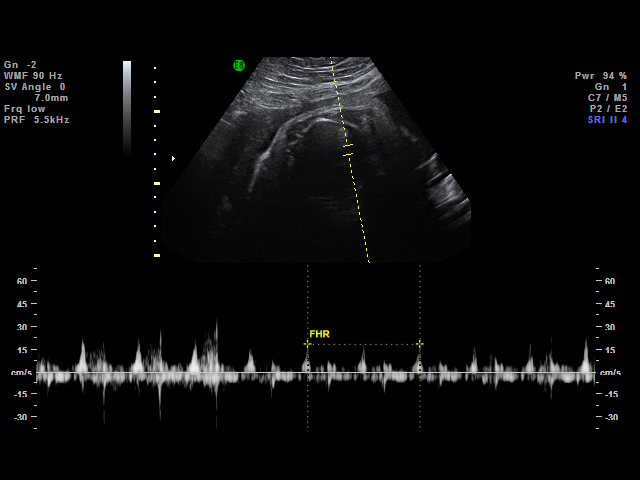
[im 11/20]
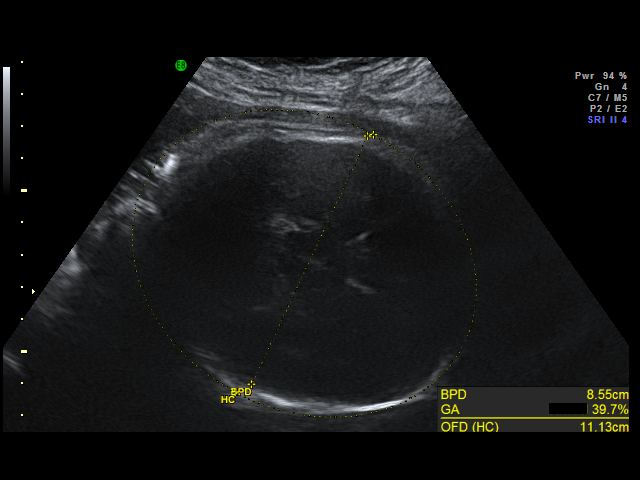
[im 12/20]
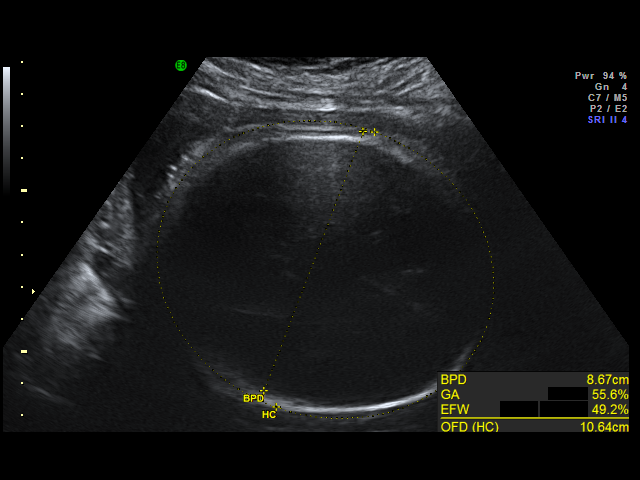
[im 13/20]
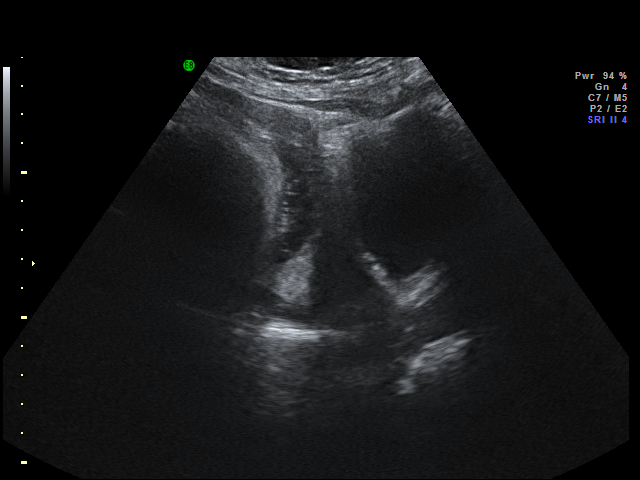
[im 14/20]
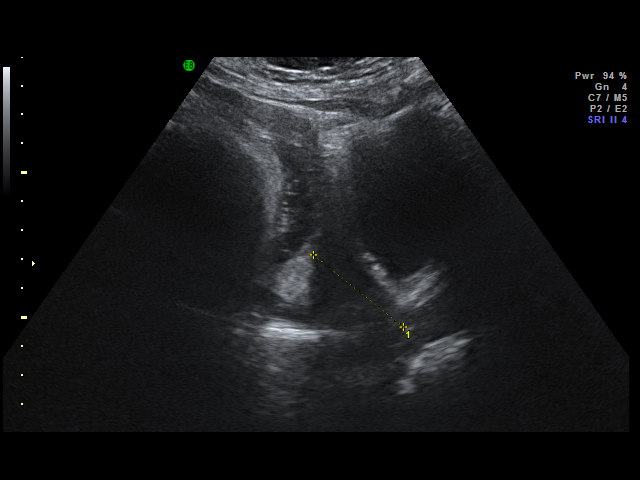
[im 16/20]
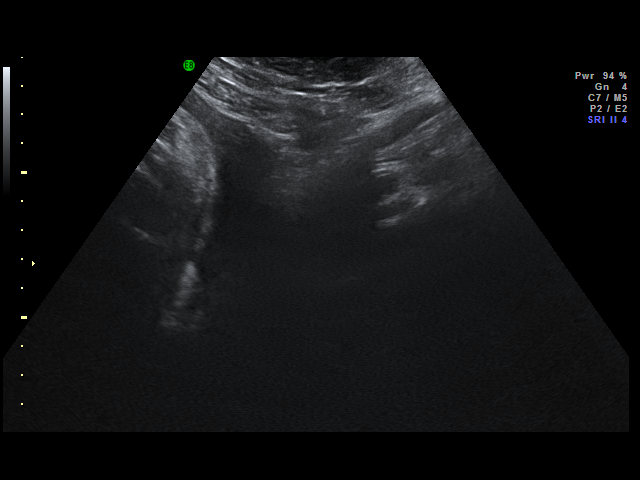
[im 17/20]
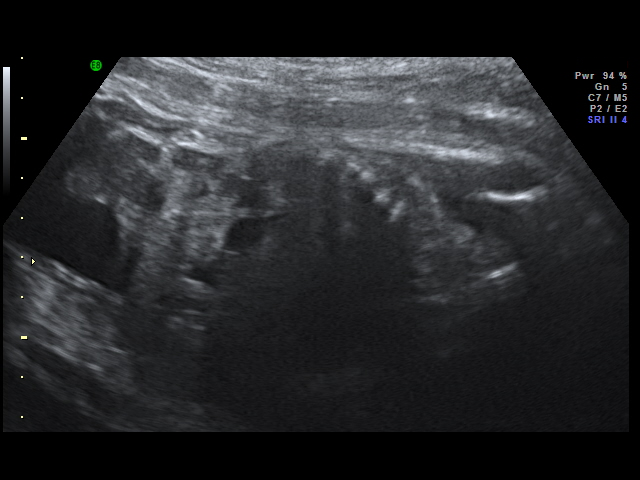
[im 18/20]
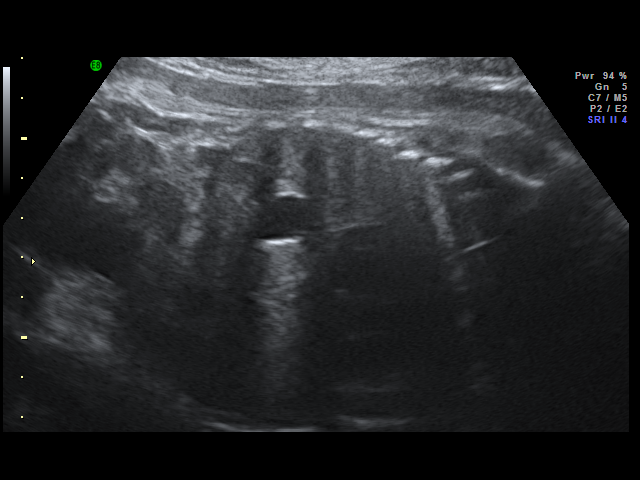
[im 19/20]
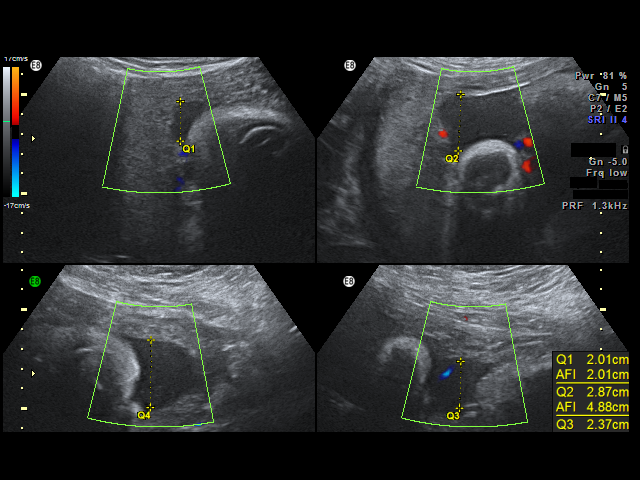
[im 20/20]
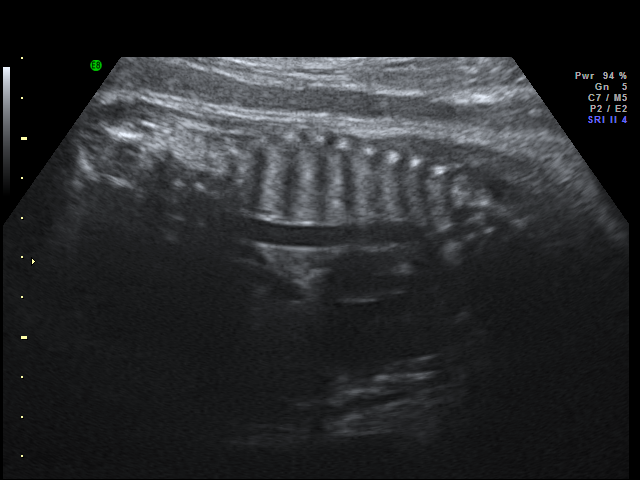

[18 of 20 positions shown; findings below may reference images not displayed]

IMPRESSION: [facility] has also been faxed to the ordering physician.

## 2011-04-18 LAB — CULTURE, BLOOD (ROUTINE X 2)
Culture: NO GROWTH
Culture: NO GROWTH

## 2011-04-18 LAB — URINALYSIS, ROUTINE W REFLEX MICROSCOPIC
Bilirubin Urine: NEGATIVE
Glucose, UA: NEGATIVE
Hgb urine dipstick: NEGATIVE
Specific Gravity, Urine: 1.014
Urobilinogen, UA: 1

## 2011-04-18 LAB — COMPREHENSIVE METABOLIC PANEL
ALT: 13
ALT: 15
AST: 15
AST: 18
Albumin: 3.1 — ABNORMAL LOW
Alkaline Phosphatase: 62
CO2: 21
CO2: 24
Calcium: 9.1
Chloride: 108
Chloride: 108
Creatinine, Ser: 0.85
GFR calc Af Amer: >60
GFR calc Af Amer: >60
GFR calc non Af Amer: >60
Glucose, Bld: 194 — ABNORMAL HIGH
Potassium: 4.2
Total Bilirubin: 0.6
Total Bilirubin: 1

## 2011-04-18 LAB — CARDIAC PANEL(CRET KIN+CKTOT+MB+TROPI)
Relative Index: 1.8
Relative Index: INVALID
Total CK: 106
Troponin I: 0.12 — ABNORMAL HIGH

## 2011-04-18 LAB — CULTURE, RESPIRATORY W GRAM STAIN
Culture: NORMAL
Gram Stain: NONE SEEN

## 2011-04-18 LAB — BASIC METABOLIC PANEL
CO2: 23
Calcium: 8.8
Chloride: 107
GFR calc Af Amer: >60
Sodium: 139

## 2011-04-18 LAB — CK TOTAL AND CKMB (NOT AT ARMC): Relative Index: INVALID

## 2011-04-18 LAB — DIFFERENTIAL
Basophils Relative: 0
Lymphs Abs: 1.2
Monocytes Absolute: 0.4
Monocytes Relative: 6
Neutro Abs: 4.8
Neutrophils Relative %: 71

## 2011-04-18 LAB — CBC
Hemoglobin: 13.3
Hemoglobin: 13.5
MCHC: 33.9
MCHC: 34
MCV: 97.7
MCV: 97.7
Platelets: 198
RBC: 3.89
RBC: 4.01
RBC: 4.05
WBC: 18.7 — ABNORMAL HIGH
WBC: 5.8
WBC: 6.8

## 2011-04-18 LAB — LIPID PANEL
Cholesterol: 173
Triglycerides: 27

## 2011-04-18 LAB — POCT PREGNANCY, URINE: Preg Test, Ur: NEGATIVE

## 2011-07-03 ENCOUNTER — Encounter (HOSPITAL_COMMUNITY): Payer: Self-pay | Admitting: Emergency Medicine

## 2011-07-03 ENCOUNTER — Inpatient Hospital Stay (HOSPITAL_COMMUNITY)
Admission: AD | Admit: 2011-07-03 | Discharge: 2011-07-11 | DRG: 194 | Disposition: A | Discharge status: Home / Self Care | Payer: Medicaid Other | Source: Ambulatory Visit | Attending: Internal Medicine | Admitting: Internal Medicine

## 2011-07-03 ENCOUNTER — Emergency Department (HOSPITAL_COMMUNITY): Payer: Medicaid Other

## 2011-07-03 ENCOUNTER — Emergency Department (HOSPITAL_COMMUNITY)
Admission: EM | Admit: 2011-07-03 | Discharge: 2011-07-03 | Disposition: A | Discharge status: Home / Self Care | Payer: Medicaid Other | Attending: Emergency Medicine | Admitting: Emergency Medicine

## 2011-07-03 ENCOUNTER — Encounter: Payer: Self-pay | Admitting: Emergency Medicine

## 2011-07-03 DIAGNOSIS — J45909 Unspecified asthma, uncomplicated: Secondary | ICD-10-CM | POA: Diagnosis present

## 2011-07-03 DIAGNOSIS — R0789 Other chest pain: Secondary | ICD-10-CM | POA: Insufficient documentation

## 2011-07-03 DIAGNOSIS — I10 Essential (primary) hypertension: Secondary | ICD-10-CM | POA: Insufficient documentation

## 2011-07-03 DIAGNOSIS — Z23 Encounter for immunization: Secondary | ICD-10-CM

## 2011-07-03 DIAGNOSIS — J189 Pneumonia, unspecified organism: Secondary | ICD-10-CM | POA: Insufficient documentation

## 2011-07-03 DIAGNOSIS — Z79899 Other long term (current) drug therapy: Secondary | ICD-10-CM | POA: Insufficient documentation

## 2011-07-03 DIAGNOSIS — R059 Cough, unspecified: Secondary | ICD-10-CM | POA: Insufficient documentation

## 2011-07-03 DIAGNOSIS — J45901 Unspecified asthma with (acute) exacerbation: Secondary | ICD-10-CM | POA: Diagnosis present

## 2011-07-03 DIAGNOSIS — R509 Fever, unspecified: Secondary | ICD-10-CM | POA: Insufficient documentation

## 2011-07-03 DIAGNOSIS — IMO0001 Reserved for inherently not codable concepts without codable children: Secondary | ICD-10-CM | POA: Insufficient documentation

## 2011-07-03 DIAGNOSIS — R05 Cough: Secondary | ICD-10-CM | POA: Insufficient documentation

## 2011-07-03 DIAGNOSIS — G4733 Obstructive sleep apnea (adult) (pediatric): Secondary | ICD-10-CM | POA: Diagnosis present

## 2011-07-03 DIAGNOSIS — F319 Bipolar disorder, unspecified: Secondary | ICD-10-CM | POA: Diagnosis present

## 2011-07-03 DIAGNOSIS — E669 Obesity, unspecified: Secondary | ICD-10-CM | POA: Diagnosis present

## 2011-07-03 DIAGNOSIS — R0602 Shortness of breath: Secondary | ICD-10-CM | POA: Insufficient documentation

## 2011-07-03 DIAGNOSIS — Z6841 Body Mass Index (BMI) 40.0 and over, adult: Secondary | ICD-10-CM

## 2011-07-03 HISTORY — DX: Essential (primary) hypertension: I10

## 2011-07-03 MED ORDER — IBUPROFEN 800 MG PO TABS: 800.0000 mg | ORAL_TABLET | Freq: Three times a day (TID) | ORAL | Status: DC | PRN

## 2011-07-03 MED ORDER — IPRATROPIUM BROMIDE 0.02 % IN SOLN
0.5000 mg | Freq: Once | RESPIRATORY_TRACT | Status: AC
  Administered 2011-07-03: 0.5 mg via RESPIRATORY_TRACT
  Filled 2011-07-03: qty 2.5

## 2011-07-03 MED ORDER — AZITHROMYCIN 250 MG PO TABS: ORAL_TABLET | ORAL | Status: DC

## 2011-07-03 MED ORDER — IBUPROFEN 800 MG PO TABS
800.0000 mg | ORAL_TABLET | Freq: Once | ORAL | Status: AC
  Administered 2011-07-03: 800 mg via ORAL
  Filled 2011-07-03: qty 1

## 2011-07-03 MED ORDER — ONDANSETRON 8 MG PO TBDP: 8.0000 mg | ORAL_TABLET | Freq: Three times a day (TID) | ORAL | Status: DC | PRN

## 2011-07-03 MED ORDER — AZITHROMYCIN 250 MG PO TABS
500.0000 mg | ORAL_TABLET | Freq: Once | ORAL | Status: AC
  Administered 2011-07-03: 500 mg via ORAL
  Filled 2011-07-03: qty 2

## 2011-07-03 MED ORDER — AZITHROMYCIN 250 MG PO TABS: 250.0000 mg | ORAL_TABLET | Freq: Every day | ORAL | Status: DC

## 2011-07-03 MED ORDER — ONDANSETRON 4 MG PO TBDP
8.0000 mg | ORAL_TABLET | Freq: Once | ORAL | Status: AC
  Administered 2011-07-03: 8 mg via ORAL
  Filled 2011-07-03: qty 2

## 2011-07-03 MED ORDER — ALBUTEROL SULFATE (5 MG/ML) 0.5% IN NEBU
5.0000 mg | INHALATION_SOLUTION | Freq: Once | RESPIRATORY_TRACT | Status: AC
  Administered 2011-07-03: 5 mg via RESPIRATORY_TRACT
  Filled 2011-07-03 (×2): qty 0.5

## 2011-07-03 MED ORDER — ALBUTEROL SULFATE (5 MG/ML) 0.5% IN NEBU
5.0000 mg | INHALATION_SOLUTION | Freq: Once | RESPIRATORY_TRACT | Status: AC
  Administered 2011-07-03: 5 mg via RESPIRATORY_TRACT
  Filled 2011-07-03: qty 1

## 2011-07-03 MED ORDER — ACETAMINOPHEN 325 MG PO TABS
650.0000 mg | ORAL_TABLET | Freq: Once | ORAL | Status: AC
  Administered 2011-07-03: 650 mg via ORAL

## 2011-07-03 MED ORDER — ACETAMINOPHEN 325 MG PO TABS
650.0000 mg | ORAL_TABLET | Freq: Once | ORAL | Status: AC
  Administered 2011-07-03: 650 mg via ORAL
  Filled 2011-07-03: qty 2

## 2011-07-03 MED ORDER — ALBUTEROL SULFATE (5 MG/ML) 0.5% IN NEBU
2.5000 mg | INHALATION_SOLUTION | Freq: Once | RESPIRATORY_TRACT | Status: AC
  Administered 2011-07-03: 2.5 mg via RESPIRATORY_TRACT
  Filled 2011-07-03: qty 0.5

## 2011-07-03 MED ORDER — ACETAMINOPHEN 325 MG PO TABS
ORAL_TABLET | ORAL | Status: AC
  Filled 2011-07-03: qty 2

## 2011-07-03 NOTE — ED Provider Notes (Signed)
History     CSN: [id number] Arrival date & time: 07/03/2011  9:06 PM   First MD Initiated Contact with Patient 07/03/11 2137      Chief Complaint  Patient presents with  . Shortness of Breath    (Consider location/radiation/quality/duration/timing/severity/associated sxs/prior treatment) HPI  Patient who was seen in our ER this morning with acute onset flu like symptoms that started today with fever of 102 and CAP seen on chest xray with symptoms of SOB and wheezing improved after antipyretics and neb and sent home for OP management but returns stating ongoing fevers, with increasing SOB and wheezing since leaving this morning. Patient states she has hx of asthma but has not been on prescribed medications at home for asthma stating "asthma only flares when I am sick" however patient reports that she has hx of needing intubation when she was 35 yrs old due to severe asthma exacerbation but she states "no problems since." Patient states hx of HTN and bipolar but that she is not currently on BP.  medication. Patient is complaining of total body aches, as well as fevers, chills, wheezing and coughing. Patient received PO zithromax in ER before leaving this morning. Patient complaining of increasing tightness in her chest with wheezing and coughing.   Past Medical History  Diagnosis Date  . Asthma   . Hypertension     History reviewed. No pertinent past surgical history.  History reviewed. No pertinent family history.  History  Substance Use Topics  . Smoking status: Never Smoker   . Smokeless tobacco: Not on file  . Alcohol Use: Yes    OB History    Grav Para Term Preterm Abortions TAB SAB Ect Mult Living                  Review of Systems  All other systems reviewed and are negative.    Allergies  Review of patient's allergies indicates no known allergies.  Home Medications   Current Outpatient Rx  Name Route Sig Dispense Refill  . AZITHROMYCIN 250 MG PO TABS Oral  Take 250 mg by mouth daily. Take two tablets PO on day 1 and one tablet PO days 2-5     . IBUPROFEN 200 MG PO TABS Oral Take 400 mg by mouth every 6 (six) hours as needed. Fever and pain    . LABETALOL HCL 100 MG PO TABS Oral Take 100 mg by mouth 2 (two) times daily.      Marland Kitchen ONDANSETRON 8 MG PO TBDP Oral Take 8 mg by mouth every 8 (eight) hours as needed.        BP 166/101  Pulse 102  Temp(Src) 102.1 F (38.9 C) (Oral)  Resp 24  SpO2 95%  LMP 05/24/2011  Physical Exam  Vitals reviewed. Constitutional: She is oriented to person, place, and time. She appears well-developed and well-nourished. No distress.  HENT:  Head: Normocephalic and atraumatic.  Right Ear: External ear normal.  Left Ear: External ear normal.  Nose: Nose normal.  Mouth/Throat: No oropharyngeal exudate.       Mild erythema of posterior pharynx and tonsils no tonsillar exudate or enlargement. Patent airway. Swallowing secretions well  Eyes: Conjunctivae and EOM are normal. Pupils are equal, round, and reactive to light.  Neck: Normal range of motion. Neck supple.  Cardiovascular: Regular rhythm, normal heart sounds and intact distal pulses.  Tachycardia present.  Exam reveals no gallop and no friction rub.   No murmur heard. Pulmonary/Chest: Effort normal. No respiratory  distress. She has wheezes. She has rales. She exhibits no tenderness.       Crackles at bilateral bases with exp wheezing but no resp distress with patient pulse ox >98% on room air but she is requesting [place] O2  Abdominal: Soft. She exhibits no distension and no mass. There is no tenderness. There is no rebound and no guarding.  Musculoskeletal: She exhibits tenderness. She exhibits no edema.       TTP of entire body with light touch to include extremities, chest wall, entire back though abdomen soft and nontender. Extremities are neurovasc intact without skin changes, heat, crepitous.   Lymphadenopathy:    She has no cervical adenopathy.    Neurological: She is alert and oriented to person, place, and time. She has normal reflexes.  Skin: Skin is warm and dry. No rash noted. She is not diaphoretic.  Psychiatric: She has a normal mood and affect.    ED Course  Procedures (including critical care time)   Date: 07/04/2011  Rate: 107  Rhythm: sinus tachycardia  QRS Axis: normal  Intervals: normal  ST/T Wave abnormalities: normal  Conduction Disutrbances:none  Narrative Interpretation:   Old EKG Reviewed: none available    Repeat nebs, IV fluids, PO tylenol and IV dialudid for pain  Patient with CAP and likely ILI and febrile however due to ongoing hypertension and complaints of chest tightness, will obtain EKG and cardiac markers though tightness and wheezing likely due to asthma exacerbation from CAP.   IV rocephin.  2:20 AM The patient has had pulse ox >98% whether on room air or 2 liters though requesting 2 liters for comfort. No resp distress, on monitor.  Sign out given to Dr. Lynelle Doctor with labs pending with likely admission for failed OP treatment of CAP and asthma exacerbation but ultimate disp pending labs and patient status at repeat eval in ER.   Labs Reviewed - No data to display Dg Chest 2 View  07/03/2011  *RADIOLOGY REPORT*  Clinical Data: Cough, fever, shortness of breath.  Bodyaches. Vomiting.  CHEST - 2 VIEW  Comparison: 04/02/2008  Findings: The heart is enlarged.  There is patchy infiltrate involving the right lung base.  No evidence for pulmonary edema. Visualized osseous structures have a normal appearance.  IMPRESSION:  1.  Right lower lobe infiltrate. 2.  Cardiomegaly without pulmonary edema.  Original Report Authenticated By: Patterson Hammersmith, M.D.     No diagnosis found.    MDM          Jenness Corner, PA 07/04/11 1610  Jenness Corner, PA 07/04/11 [phone]

## 2011-07-03 NOTE — ED Notes (Signed)
Pt c/o fever and generalized body aches with productive cough starting yesterday

## 2011-07-03 NOTE — ED Notes (Signed)
PT. REPORTS PERSISTENT SOB WITH PRODUCTIVE COUGH - SEEN HERE TODAY DIAGNOSED WITH PNEUMONIA , ALSO REPORTS FEVER UNRELIEVED BY TYLENOL.

## 2011-07-03 NOTE — ED Provider Notes (Signed)
History     CSN: [id number] Arrival date & time: 07/03/2011  7:46 AM   First MD Initiated Contact with Patient 07/03/11 0801      Chief Complaint  Patient presents with  . Fever  . Cough    (Consider location/radiation/quality/duration/timing/severity/associated sxs/prior treatment) HPI Comments: Patient with a history of asthma - presents with onset of chest tightness yesterday afternoon. She thought it was asthma attack however she then developed a fever, worsening cough and myalgias. This has continued over night and she comes in this morning for evaluation. She denies upper respiratory symptoms, vomiting, abdominal pain, dysuria. No treatments prior to arrival other than Advil.  Patient is a 35 y.o. female presenting with fever and cough. The history is provided by the patient.  Fever Primary symptoms of the febrile illness include fever, cough, wheezing, shortness of breath and myalgias. Primary symptoms do not include fatigue, headaches, abdominal pain, nausea, vomiting, diarrhea, dysuria or rash. The current episode started yesterday.  Cough Associated symptoms include chills, myalgias, shortness of breath and wheezing. Pertinent negatives include no chest pain, no headaches, no rhinorrhea and no sore throat.    Past Medical History  Diagnosis Date  . Asthma   . Hypertension     History reviewed. No pertinent past surgical history.  History reviewed. No pertinent family history.  History  Substance Use Topics  . Smoking status: Never Smoker   . Smokeless tobacco: Not on file  . Alcohol Use: Yes    OB History    Grav Para Term Preterm Abortions TAB SAB Ect Mult Living                  Review of Systems  Constitutional: Positive for fever and chills. Negative for fatigue.  HENT: Negative for sore throat and rhinorrhea.   Eyes: Negative for discharge.  Respiratory: Positive for cough, shortness of breath and wheezing.   Cardiovascular: Negative for chest pain.   Gastrointestinal: Negative for nausea, vomiting, abdominal pain, diarrhea and constipation.  Genitourinary: Negative for dysuria.  Musculoskeletal: Positive for myalgias.  Skin: Negative for rash.  Neurological: Negative for headaches.  Psychiatric/Behavioral: Negative for confusion.  All other systems reviewed and are negative.    Allergies  Review of patient's allergies indicates no known allergies.  Home Medications   Current Outpatient Rx  Name Route Sig Dispense Refill  . IBUPROFEN 200 MG PO TABS Oral Take 400 mg by mouth every 6 (six) hours as needed. Fever and pain    . LABETALOL HCL 100 MG PO TABS Oral Take 100 mg by mouth 2 (two) times daily.        BP 186/114  Pulse 114  Temp(Src) 102.4 F (39.1 C) (Oral)  Resp 20  SpO2 95%  Physical Exam  Nursing note and vitals reviewed. Constitutional: She is oriented to person, place, and time. She appears well-developed and well-nourished.  HENT:  Head: Normocephalic and atraumatic.  Right Ear: Tympanic membrane normal.  Left Ear: Tympanic membrane normal.  Nose: Nose normal. No mucosal edema or rhinorrhea.  Eyes: Pupils are equal, round, and reactive to light. Right eye exhibits no discharge. Left eye exhibits no discharge.  Neck: Normal range of motion. Neck supple.  Cardiovascular: Normal rate and regular rhythm.  Exam reveals no gallop and no friction rub.   No murmur heard. Pulmonary/Chest: Effort normal. No respiratory distress. She has wheezes.       Diffuse expiratory wheezing in all fields.  Abdominal: Soft. There is no tenderness. There  is no rebound and no guarding.  Musculoskeletal: Normal range of motion.  Neurological: She is alert and oriented to person, place, and time.  Skin: Skin is warm and dry. No rash noted.  Psychiatric: She has a normal mood and affect.    ED Course  Procedures (including critical care time)  Labs Reviewed - No data to display Dg Chest 2 View  07/03/2011  *RADIOLOGY  REPORT*  Clinical Data: Cough, fever, shortness of breath.  Bodyaches. Vomiting.  CHEST - 2 VIEW  Comparison: 04/02/2008  Findings: The heart is enlarged.  There is patchy infiltrate involving the right lung base.  No evidence for pulmonary edema. Visualized osseous structures have a normal appearance.  IMPRESSION:  1.  Right lower lobe infiltrate. 2.  Cardiomegaly without pulmonary edema.  Original Report Authenticated By: Patterson Hammersmith, M.D.     1. Community acquired pneumonia     8:12 AM patient seen and examined. Suspect influenza. Will check chest x-ray to rule out pneumonia and give breathing treatment for wheezing. Patient has been given Tylenol for fever.  9:55 AM x-ray reviewed by myself. Patient has a right lower lobe pneumonia. Azithromycin given in emergency department. Patient's temperature is improve. She states that her breathing is much better after breathing treatment. Patient is stable for discharge home.  10:01 AMpatient had an episode of vomiting after receiving oral antibiotics. Will give Zofran. Will prescribe full five-day course of azithromycin given the possibility of patient vomiting antibiotics.    MDM  Community-acquired pneumonia. Patient is stable and appears well in the emergency department. Patient has influenza-like symptoms however she has clinical pneumonia on x-ray. Do not suspect PE or other lung or cardiac problem.        Carolee Rota, PA 07/03/11 [address] [place], [place] 07/03/11 1003

## 2011-07-03 NOTE — ED Provider Notes (Signed)
Medical screening examination/treatment/procedure(s) were performed by non-physician practitioner and as supervising physician I was immediately available for consultation/collaboration.   Gwyneth Sprout, MD 07/03/11 1110

## 2011-07-04 ENCOUNTER — Emergency Department (HOSPITAL_COMMUNITY): Payer: Medicaid Other

## 2011-07-04 ENCOUNTER — Encounter (HOSPITAL_COMMUNITY): Payer: Self-pay | Admitting: Emergency Medicine

## 2011-07-04 ENCOUNTER — Other Ambulatory Visit: Payer: Self-pay

## 2011-07-04 DIAGNOSIS — J189 Pneumonia, unspecified organism: Secondary | ICD-10-CM | POA: Diagnosis present

## 2011-07-04 LAB — CBC
HCT: 34.5 % — ABNORMAL LOW (ref 36.0–46.0)
MCH: 31.5 pg (ref 26.0–34.0)
MCHC: 34.5 g/dL (ref 30.0–36.0)
MCV: 91.3 fL (ref 78.0–100.0)
Platelets: 154 10*3/uL (ref 150–400)
RDW: 14.5 % (ref 11.5–15.5)
WBC: 4.3 10*3/uL (ref 4.0–10.5)

## 2011-07-04 LAB — COMPREHENSIVE METABOLIC PANEL
AST: 16 U/L (ref 0–37)
Albumin: 3 g/dL — ABNORMAL LOW (ref 3.5–5.2)
Chloride: 105 mEq/L (ref 96–112)
Creatinine, Ser: 0.85 mg/dL (ref 0.50–1.10)
Potassium: 3.7 mEq/L (ref 3.5–5.1)
Total Bilirubin: 0.2 mg/dL — ABNORMAL LOW (ref 0.3–1.2)

## 2011-07-04 LAB — POCT I-STAT 3, ART BLOOD GAS (G3+)
Acid-base deficit: 2 mmol/L (ref 0.0–2.0)
Bicarbonate: 22.2 mEq/L (ref 20.0–24.0)
Patient temperature: 102.8
TCO2: 23 mmol/L (ref 0–100)
pO2, Arterial: 69 mmHg — ABNORMAL LOW (ref 80.0–100.0)

## 2011-07-04 LAB — PRO B NATRIURETIC PEPTIDE: Pro B Natriuretic peptide (BNP): 346 pg/mL — ABNORMAL HIGH (ref 0–125)

## 2011-07-04 LAB — DIFFERENTIAL
Basophils Absolute: 0 10*3/uL (ref 0.0–0.1)
Basophils Relative: 0 % (ref 0–1)
Eosinophils Absolute: 0.1 10*3/uL (ref 0.0–0.7)
Eosinophils Relative: 2 % (ref 0–5)
Monocytes Absolute: 0.5 10*3/uL (ref 0.1–1.0)

## 2011-07-04 LAB — GLUCOSE, CAPILLARY
Glucose-Capillary: 129 mg/dL — ABNORMAL HIGH (ref 70–99)
Glucose-Capillary: 100 mg/dL — ABNORMAL HIGH (ref 70–99)

## 2011-07-04 LAB — CK TOTAL AND CKMB (NOT AT ARMC): Total CK: 133 U/L (ref 7–177)

## 2011-07-04 LAB — POCT I-STAT, CHEM 8
BUN: 5 mg/dL — ABNORMAL LOW (ref 6–23)
Calcium, Ion: 1.12 mmol/L (ref 1.12–1.32)
Chloride: 104 mEq/L (ref 96–112)
Glucose, Bld: 96 mg/dL (ref 70–99)
HCT: 40 % (ref 36.0–46.0)
Potassium: 3.6 mEq/L (ref 3.5–5.1)

## 2011-07-04 LAB — HEMOGLOBIN A1C: Mean Plasma Glucose: 117 mg/dL — ABNORMAL HIGH (ref <117)

## 2011-07-04 LAB — TSH: TSH: 0.301 u[IU]/mL — ABNORMAL LOW (ref 0.350–4.500)

## 2011-07-04 LAB — RAPID URINE DRUG SCREEN, HOSP PERFORMED: Benzodiazepines: NOT DETECTED

## 2011-07-04 LAB — POCT I-STAT TROPONIN I: Troponin i, poc: 0.01 ng/mL (ref 0.00–0.08)

## 2011-07-04 LAB — PHOSPHORUS: Phosphorus: 2.9 mg/dL (ref 2.3–4.6)

## 2011-07-04 MED ORDER — LABETALOL HCL 100 MG PO TABS
100.0000 mg | ORAL_TABLET | Freq: Two times a day (BID) | ORAL | Status: DC
  Administered 2011-07-04 – 2011-07-08 (×9): 100 mg via ORAL
  Filled 2011-07-04 (×10): qty 1

## 2011-07-04 MED ORDER — AZITHROMYCIN 500 MG IV SOLR
500.0000 mg | INTRAVENOUS | Status: DC
  Administered 2011-07-04 – 2011-07-07 (×4): 500 mg via INTRAVENOUS
  Filled 2011-07-04 (×4): qty 500

## 2011-07-04 MED ORDER — OLMESARTAN MEDOXOMIL 40 MG PO TABS
40.0000 mg | ORAL_TABLET | Freq: Every day | ORAL | Status: DC
  Administered 2011-07-04 – 2011-07-08 (×5): 40 mg via ORAL
  Filled 2011-07-04 (×5): qty 1

## 2011-07-04 MED ORDER — ONDANSETRON HCL 4 MG PO TABS
4.0000 mg | ORAL_TABLET | Freq: Four times a day (QID) | ORAL | Status: DC | PRN
  Administered 2011-07-07: 4 mg via ORAL
  Filled 2011-07-04 (×2): qty 1
  Filled 2011-07-04: qty 0.5

## 2011-07-04 MED ORDER — CEFTRIAXONE SODIUM 1 G IJ SOLR
1.0000 g | INTRAMUSCULAR | Status: DC
  Administered 2011-07-05 – 2011-07-09 (×5): 1 g via INTRAVENOUS
  Filled 2011-07-04 (×6): qty 10

## 2011-07-04 MED ORDER — IPRATROPIUM BROMIDE 0.02 % IN SOLN
RESPIRATORY_TRACT | Status: AC
  Filled 2011-07-04: qty 2.5

## 2011-07-04 MED ORDER — HYDRALAZINE HCL 20 MG/ML IJ SOLN
10.0000 mg | Freq: Three times a day (TID) | INTRAMUSCULAR | Status: DC | PRN
  Administered 2011-07-04: 10 mg via INTRAVENOUS
  Filled 2011-07-04: qty 0.5

## 2011-07-04 MED ORDER — ALBUTEROL SULFATE (5 MG/ML) 0.5% IN NEBU
2.5000 mg | INHALATION_SOLUTION | Freq: Four times a day (QID) | RESPIRATORY_TRACT | Status: DC
  Administered 2011-07-04: 2.5 mg via RESPIRATORY_TRACT
  Filled 2011-07-04: qty 0.5

## 2011-07-04 MED ORDER — SODIUM CHLORIDE 0.9 % IV SOLN
INTRAVENOUS | Status: DC | PRN
  Administered 2011-07-04: 07:00:00 via INTRAVENOUS

## 2011-07-04 MED ORDER — CEFTRIAXONE SODIUM 1 G IJ SOLR
1.0000 g | Freq: Once | INTRAMUSCULAR | Status: AC
  Administered 2011-07-04: 03:00:00 via INTRAVENOUS
  Filled 2011-07-04: qty 10

## 2011-07-04 MED ORDER — ALBUTEROL SULFATE (5 MG/ML) 0.5% IN NEBU
2.5000 mg | INHALATION_SOLUTION | RESPIRATORY_TRACT | Status: DC
  Administered 2011-07-04 (×3): 2.5 mg via RESPIRATORY_TRACT
  Filled 2011-07-04 (×3): qty 0.5

## 2011-07-04 MED ORDER — IPRATROPIUM BROMIDE 0.02 % IN SOLN
0.5000 mg | RESPIRATORY_TRACT | Status: DC
  Administered 2011-07-04 (×3): 0.5 mg via RESPIRATORY_TRACT
  Filled 2011-07-04 (×3): qty 2.5

## 2011-07-04 MED ORDER — CLONIDINE HCL 0.1 MG PO TABS
0.1000 mg | ORAL_TABLET | Freq: Two times a day (BID) | ORAL | Status: DC
  Administered 2011-07-04 – 2011-07-05 (×2): 0.1 mg via ORAL
  Filled 2011-07-04 (×3): qty 1

## 2011-07-04 MED ORDER — IBUPROFEN 800 MG PO TABS
ORAL_TABLET | ORAL | Status: AC
  Administered 2011-07-04: 06:00:00
  Filled 2011-07-04: qty 1

## 2011-07-04 MED ORDER — SODIUM CHLORIDE 0.9 % IV SOLN: INTRAVENOUS | Status: DC

## 2011-07-04 MED ORDER — GUAIFENESIN ER 600 MG PO TB12
600.0000 mg | ORAL_TABLET | Freq: Two times a day (BID) | ORAL | Status: DC
  Administered 2011-07-04 – 2011-07-11 (×15): 600 mg via ORAL
  Filled 2011-07-04 (×16): qty 1

## 2011-07-04 MED ORDER — SODIUM CHLORIDE 0.9 % IJ SOLN
3.0000 mL | Freq: Two times a day (BID) | INTRAMUSCULAR | Status: DC
  Administered 2011-07-05 – 2011-07-10 (×11): 3 mL via INTRAVENOUS

## 2011-07-04 MED ORDER — ALBUTEROL SULFATE (5 MG/ML) 0.5% IN NEBU: 5.0000 mg | INHALATION_SOLUTION | RESPIRATORY_TRACT | Status: DC | PRN

## 2011-07-04 MED ORDER — ONDANSETRON HCL 4 MG/2ML IJ SOLN
4.0000 mg | Freq: Four times a day (QID) | INTRAMUSCULAR | Status: DC | PRN
  Administered 2011-07-06: 4 mg via INTRAVENOUS
  Filled 2011-07-04: qty 2

## 2011-07-04 MED ORDER — SODIUM CHLORIDE 0.9 % IV BOLUS (SEPSIS)
1000.0000 mL | Freq: Once | INTRAVENOUS | Status: AC
  Administered 2011-07-04: 1000 mL via INTRAVENOUS

## 2011-07-04 MED ORDER — SODIUM CHLORIDE 0.9 % IJ SOLN
3.0000 mL | INTRAMUSCULAR | Status: DC | PRN
  Administered 2011-07-04 – 2011-07-07 (×2): 3 mL via INTRAVENOUS

## 2011-07-04 MED ORDER — DEXTROSE 5 % IV SOLN
INTRAVENOUS | Status: AC
  Filled 2011-07-04: qty 500

## 2011-07-04 MED ORDER — ENOXAPARIN SODIUM 40 MG/0.4ML ~~LOC~~ SOLN
40.0000 mg | Freq: Every day | SUBCUTANEOUS | Status: DC
  Administered 2011-07-04 – 2011-07-10 (×7): 40 mg via SUBCUTANEOUS
  Filled 2011-07-04 (×8): qty 0.4

## 2011-07-04 MED ORDER — OXYCODONE HCL 5 MG PO TABS
5.0000 mg | ORAL_TABLET | ORAL | Status: DC | PRN
  Administered 2011-07-04 – 2011-07-08 (×5): 5 mg via ORAL
  Filled 2011-07-04 (×5): qty 1

## 2011-07-04 MED ORDER — MORPHINE SULFATE 2 MG/ML IJ SOLN
1.0000 mg | INTRAMUSCULAR | Status: DC | PRN
  Administered 2011-07-06 (×2): 1 mg via INTRAVENOUS
  Filled 2011-07-04 (×2): qty 1

## 2011-07-04 MED ORDER — METHYLPREDNISOLONE SODIUM SUCC 40 MG IJ SOLR
40.0000 mg | Freq: Three times a day (TID) | INTRAMUSCULAR | Status: DC
  Administered 2011-07-04: 40 mg via INTRAVENOUS
  Filled 2011-07-04 (×4): qty 1

## 2011-07-04 MED ORDER — ALBUTEROL SULFATE (5 MG/ML) 0.5% IN NEBU
5.0000 mg | INHALATION_SOLUTION | Freq: Once | RESPIRATORY_TRACT | Status: AC
  Administered 2011-07-04: 5 mg via RESPIRATORY_TRACT

## 2011-07-04 MED ORDER — IPRATROPIUM BROMIDE 0.02 % IN SOLN
0.5000 mg | Freq: Once | RESPIRATORY_TRACT | Status: AC
  Administered 2011-07-04: 0.5 mg via RESPIRATORY_TRACT

## 2011-07-04 MED ORDER — METHYLPREDNISOLONE SODIUM SUCC 125 MG IJ SOLR
60.0000 mg | Freq: Three times a day (TID) | INTRAMUSCULAR | Status: DC
  Administered 2011-07-04 – 2011-07-06 (×6): 60 mg via INTRAVENOUS
  Filled 2011-07-04 (×6): qty 0.96
  Filled 2011-07-04: qty 2
  Filled 2011-07-04: qty 0.96

## 2011-07-04 MED ORDER — ALBUTEROL SULFATE (5 MG/ML) 0.5% IN NEBU: 2.5000 mg | INHALATION_SOLUTION | RESPIRATORY_TRACT | Status: DC | PRN

## 2011-07-04 MED ORDER — ACETAMINOPHEN 325 MG PO TABS
ORAL_TABLET | ORAL | Status: AC
  Administered 2011-07-04: 06:00:00
  Filled 2011-07-04: qty 3

## 2011-07-04 MED ORDER — HYDROMORPHONE HCL PF 1 MG/ML IJ SOLN
1.0000 mg | Freq: Once | INTRAMUSCULAR | Status: AC
Start: 2011-07-04 — End: 2011-07-04
  Administered 2011-07-04: 1 mg via INTRAVENOUS
  Filled 2011-07-04: qty 1

## 2011-07-04 MED ORDER — ALBUTEROL SULFATE (5 MG/ML) 0.5% IN NEBU
INHALATION_SOLUTION | RESPIRATORY_TRACT | Status: AC
  Filled 2011-07-04: qty 1

## 2011-07-04 NOTE — ED Notes (Signed)
Pt NAD, AOx4, ambulatory with steady gait at time of admission,.

## 2011-07-04 NOTE — Progress Notes (Signed)
Patient ID: Paula Fitzgerald, female   DOB: 05/04/1976, 35 y.o.   MRN: [id number] Subjective: Patient seen.Feels better.Complain about cough but denies any chest pain  Objective: Weight change:  No intake or output data in the 24 hours ending 07/04/11 0906 BP 136/83  Pulse 90  Temp(Src) 98 F (36.7 C) (Oral)  Resp 22  Ht 5\' 3"  (1.6 m)  Wt 139.7 kg (307 lb 15.7 oz)  BMI 54.56 kg/m2  SpO2 96%  LMP 05/24/2011 Physical Exam: General appearance:obese, alert, cooperative and no distress Head: Normocephalic, without obvious abnormality, atraumatic Neck: no adenopathy, no carotid bruit, no JVD, supple, symmetrical, trachea midline and thyroid not enlarged, symmetric, no tenderness/mass/nodules Lungs: widespread rhonchi with rt lower lobe crackles Heart: regular rate and rhythm, S1, S2 normal, no murmur, click, rub or gallop Abdomen: soft, non-tender; bowel sounds normal; no masses,  no organomegaly Extremities: extremities normal, atraumatic, no cyanosis or edema Skin: Skin color, texture, turgor normal. No rashes or lesions  Lab Results: Results for orders placed during the hospital encounter of 07/03/11 (from the past 48 hour(s))  CBC     Status: Abnormal   Collection Time   07/04/11  1:48 AM      Component Value Range Comment   WBC 4.3  4.0 - 10.5 (K/uL)    RBC 3.78 (*) 3.87 - 5.11 (MIL/uL)    Hemoglobin 11.9 (*) 12.0 - 15.0 (g/dL)    HCT 62.9 (*) 52.8 - 46.0 (%)    MCV 91.3  78.0 - 100.0 (fL)    MCH 31.5  26.0 - 34.0 (pg)    MCHC 34.5  30.0 - 36.0 (g/dL)    RDW 41.3  24.4 - 01.0 (%)    Platelets 154  150 - 400 (K/uL)   DIFFERENTIAL     Status: Abnormal   Collection Time   07/04/11  1:48 AM      Component Value Range Comment   Neutrophils Relative 70  43 - 77 (%)    Neutro Abs 3.0  1.7 - 7.7 (K/uL)    Lymphocytes Relative 16  12 - 46 (%)    Lymphs Abs 0.7  0.7 - 4.0 (K/uL)    Monocytes Relative 13 (*) 3 - 12 (%)    Monocytes Absolute 0.5  0.1 - 1.0 (K/uL)    Eosinophils Relative 2  0 - 5 (%)    Eosinophils Absolute 0.1  0.0 - 0.7 (K/uL)    Basophils Relative 0  0 - 1 (%)    Basophils Absolute 0.0  0.0 - 0.1 (K/uL)   CK TOTAL AND CKMB     Status: Normal   Collection Time   07/04/11  1:49 AM      Component Value Range Comment   Total CK 133  7 - 177 (U/L)    CK, MB 1.9  0.3 - 4.0 (ng/mL)    Relative Index 1.4  0.0 - 2.5    POCT I-STAT TROPONIN I     Status: Normal   Collection Time   07/04/11  2:15 AM      Component Value Range Comment   Troponin i, poc 0.01  0.00 - 0.08 (ng/mL)    Comment 3            POCT I-STAT, CHEM 8     Status: Abnormal   Collection Time   07/04/11  2:29 AM      Component Value Range Comment   Sodium 139  135 - 145 (mEq/L)  Potassium 3.6  3.5 - 5.1 (mEq/L)    Chloride 104  96 - 112 (mEq/L)    BUN 5 (*) 6 - 23 (mg/dL)    Creatinine, Ser 0.45  0.50 - 1.10 (mg/dL)    Glucose, Bld 96  70 - 99 (mg/dL)    Calcium, Ion 4.09  1.12 - 1.32 (mmol/L)    TCO2 24  0 - 100 (mmol/L)    Hemoglobin 13.6  12.0 - 15.0 (g/dL)    HCT 81.1  91.4 - 78.2 (%)   POCT I-STAT 3, BLOOD GAS (G3+)     Status: Abnormal   Collection Time   07/04/11  3:14 AM      Component Value Range Comment   pH, Arterial 7.354  7.350 - 7.400     pCO2 arterial 40.7  35.0 - 45.0 (mmHg)    pO2, Arterial 69.0 (*) 80.0 - 100.0 (mmHg)    Bicarbonate 22.2  20.0 - 24.0 (mEq/L)    TCO2 23  0 - 100 (mmol/L)    O2 Saturation 90.0      Acid-base deficit 2.0  0.0 - 2.0 (mmol/L)    Patient temperature 102.8 F      Collection site RADIAL, ALLEN'S TEST ACCEPTABLE      Drawn by RT      Sample type ARTERIAL     COMPREHENSIVE METABOLIC PANEL     Status: Abnormal   Collection Time   07/04/11  5:01 AM      Component Value Range Comment   Sodium 137  135 - 145 (mEq/L)    Potassium 3.7  3.5 - 5.1 (mEq/L)    Chloride 105  96 - 112 (mEq/L)    CO2 24  19 - 32 (mEq/L)    Glucose, Bld 97  70 - 99 (mg/dL)    BUN 6  6 - 23 (mg/dL)    Creatinine, Ser 9.56  0.50 - 1.10  (mg/dL)    Calcium 8.2 (*) 8.4 - 10.5 (mg/dL)    Total Protein 6.4  6.0 - 8.3 (g/dL)    Albumin 3.0 (*) 3.5 - 5.2 (g/dL)    AST 16  0 - 37 (U/L)    ALT 13  0 - 35 (U/L)    Alkaline Phosphatase 64  39 - 117 (U/L)    Total Bilirubin 0.2 (*) 0.3 - 1.2 (mg/dL)    GFR calc non Af Amer 87 (*) >90 (mL/min)    GFR calc Af Amer >90  >90 (mL/min)   MAGNESIUM     Status: Normal   Collection Time   07/04/11  5:01 AM      Component Value Range Comment   Magnesium 1.6  1.5 - 2.5 (mg/dL)   PHOSPHORUS     Status: Normal   Collection Time   07/04/11  5:01 AM      Component Value Range Comment   Phosphorus 2.9  2.3 - 4.6 (mg/dL)   PRO B NATRIURETIC PEPTIDE     Status: Abnormal   Collection Time   07/04/11  5:01 AM      Component Value Range Comment   Pro B Natriuretic peptide (BNP) 346.0 (*) 0 - 125 (pg/mL)   GLUCOSE, CAPILLARY     Status: Abnormal   Collection Time   07/04/11  6:18 AM      Component Value Range Comment   Glucose-Capillary 129 (*) 70 - 99 (mg/dL)   GLUCOSE, CAPILLARY     Status: Abnormal   Collection Time  07/04/11  8:36 AM      Component Value Range Comment   Glucose-Capillary 100 (*) 70 - 99 (mg/dL)     Micro Results: No results found for this or any previous visit (from the past 240 hour(s)).  Studies/Results: Dg Chest 2 View  07/04/2011  *RADIOLOGY REPORT*  Clinical Data: Increasing shortness of breath, fever.  CHEST - 2 VIEW  Comparison: 07/03/2011  Findings: Right lower lobe opacity is similar to slightly improved in the interval.  Cardiomegaly with central vascular congestion. Central peribronchial cuffing.  No pleural effusion or pneumothorax.  No acute osseous abnormality.  IMPRESSION: Mild right lower lobe opacity persists though decreased in the interval.  Cardiomegaly with central vascular congestion.  Peribronchial cuffing may represent bronchitis or edema.  Original Report Authenticated By: Waneta Martins, M.D.   Dg Chest 2 View  07/03/2011  *RADIOLOGY  REPORT*  Clinical Data: Cough, fever, shortness of breath.  Bodyaches. Vomiting.  CHEST - 2 VIEW  Comparison: 04/02/2008  Findings: The heart is enlarged.  There is patchy infiltrate involving the right lung base.  No evidence for pulmonary edema. Visualized osseous structures have a normal appearance.  IMPRESSION:  1.  Right lower lobe infiltrate. 2.  Cardiomegaly without pulmonary edema.  Original Report Authenticated By: Patterson Hammersmith, M.D.   Medications: Scheduled Meds:   . acetaminophen      . acetaminophen  650 mg Oral Once  . albuterol  2.5 mg Nebulization Once  . albuterol  2.5 mg Nebulization Q6H  . albuterol  2.5 mg Nebulization Q4H  . albuterol  5 mg Nebulization Once  . albuterol  5 mg Nebulization Once  . albuterol      . azithromycin  500 mg Intravenous Q24H  . cefTRIAXone (ROCEPHIN)  IV  1 g Intravenous Once  . cefTRIAXone (ROCEPHIN)  IV  1 g Intravenous Q24H  . azithromycin (ZITHROMAX) 500 MG IVPB      . enoxaparin  40 mg Subcutaneous Daily  . guaiFENesin  600 mg Oral BID  .  HYDROmorphone (DILAUDID) injection  1 mg Intravenous Once  . ibuprofen      . ipratropium      . ipratropium  0.5 mg Nebulization Once  . ipratropium  0.5 mg Nebulization Once  . ipratropium  0.5 mg Nebulization Q4H  . labetalol  100 mg Oral BID  . methylPREDNISolone (SOLU-MEDROL) injection  60 mg Intravenous Q8H  . olmesartan  40 mg Oral Daily  . sodium chloride  1,000 mL Intravenous Once  . sodium chloride  3 mL Intravenous Q12H  . DISCONTD: sodium chloride   Intravenous STAT  . DISCONTD: methylPREDNISolone (SOLU-MEDROL) injection  40 mg Intravenous Q8H   Continuous Infusions:  PRN Meds:.hydrALAZINE, morphine, ondansetron (ZOFRAN) IV, ondansetron, oxyCODONE, sodium chloride, DISCONTD: albuterol, DISCONTD: albuterol  Assessment/Plan: #1 Acute Asthmatic Attack-will continue breathing treatment and iv solumedrol #2 PNA-will continue iv rocephin and zithromax  #3 BIPOLAR DISORDER  UNSPECIFIED  #4 OBSTRUCTIVE SLEEP APNEA-cpap at night #5 HTN-will add benicar to regimen #6 Obesity Transfer to general medical floor.   LOS: 1 day   Kashay Cavenaugh 07/04/2011, 9:06 AM

## 2011-07-04 NOTE — ED Provider Notes (Signed)
See prior note   Ward Givens, MD 07/04/11 1535

## 2011-07-04 NOTE — H&P (Signed)
PCP:  No primary provider on file.   DOA:  07/03/2011  9:06 PM  Chief Complaint:  Shortness of breath  HPI: Patient is 35 yo female with PMH outlined below who presents to [facility] ED with main concern of progressively worsening shortness of breath associated with fevers as high as 102 F, wheezing, productive cough of yellowish sputum. Symptoms have been getting progressively worse and have initially started 1-2 days prior to admission. Pt was seen in ED earlier this AM and was sent home with antibiotic prescription and was told she has CAP. Her SOB was getting worse and she came back. Pt reports having asthma but is not taking any scheduled medications for it since she has not has any recent flare ups and she mentions that only time her asthma flares up is when she gets sick with PNA for example.Pt also reports having bipolar disorder and HTN but was not taking any medications for it at home. She denies recent sick contacts or exposures, no recent hospitalizations, no recent traveling. She also denies abdominal or urinary concerns, no specific focal weakness. She does report chest tightness only with coughing, and the pain is nonradiating, she denies palpitations, no orthopnea or PND, no lower extremity edema.  Allergies: No Known Allergies  Prior to Admission medications   Medication Sig Start Date End Date Taking? Authorizing Provider  azithromycin (ZITHROMAX) 250 MG tablet Take 250 mg by mouth daily. Take two tablets PO on day 1 and one tablet PO days 2-5  07/03/11 07/08/11 Yes Carolee Rota, PA  ibuprofen (ADVIL,MOTRIN) 200 MG tablet Take 400 mg by mouth every 6 (six) hours as needed. Fever and pain   Yes Historical Provider, MD  labetalol (NORMODYNE) 100 MG tablet Take 100 mg by mouth 2 (two) times daily.     Yes Historical Provider, MD  ondansetron (ZOFRAN-ODT) 8 MG disintegrating tablet Take 8 mg by mouth every 8 (eight) hours as needed.   07/03/11 07/10/11 Yes Carolee Rota, PA    Past  Medical History  Diagnosis Date  . Asthma   . Hypertension     History reviewed. No pertinent past surgical history.  Social History:  reports that she has never smoked. She does not have any smokeless tobacco history on file. She reports that she drinks alcohol. She reports that she does not use illicit drugs.  History reviewed. No pertinent family history.  Review of Systems:  Per HPI  Physical Exam:  Filed Vitals:   07/03/11 2157 07/03/11 2314 07/04/11 0045 07/04/11 0213  BP:  166/101 180/114 141/88  Pulse:   120 109  Temp: 102.9 F (39.4 C) 102.1 F (38.9 C) 102.7 F (39.3 C) 102.8 F (39.3 C)  TempSrc: Oral Oral Oral Oral  Resp:  24 20 22   SpO2:  95% 96% 100%    Constitutional: Vital signs reviewed.  Patient is a well-developed and well-nourished in no acute distress and cooperative with exam. Alert and oriented x3.  Head: Normocephalic and atraumatic Ear: TM normal bilaterally Mouth: no erythema or exudates, MMM Eyes: PERRL, EOMI, conjunctivae normal, No scleral icterus.  Neck: Supple, Trachea midline normal ROM, No JVD, mass, thyromegaly, or carotid bruit present.  Cardiovascular: Regular rhythm but tachycardic, S1 normal, S2 normal, no MRG, pulses symmetric and intact bilaterally Pulmonary/Chest: rhonchorous breath sounds with bibasilar crackles and end expiratory wheezing and was told by ED physician that it is improving at this time Abdominal: Soft. Non-tender, non-distended, bowel sounds are normal, no masses, organomegaly, or  guarding present.  GU: no CVA tenderness Musculoskeletal: No joint deformities, erythema, or stiffness, ROM full and no nontender Ext: no edema and no cyanosis, pulses palpable bilaterally (DP and PT) Hematology: no cervical, inginal, or axillary adenopathy.  Neurological: A&O x3, Strenght is normal and symmetric bilaterally, cranial nerve II-XII are grossly intact, no focal motor deficit, sensory intact to light touch bilaterally.    Skin: Warm, dry and intact. No rash, cyanosis, or clubbing.  Psychiatric: Normal mood and affect. speech and behavior is normal. Judgment and thought content normal. Cognition and memory are normal.   Labs on Admission:  Results for orders placed during the hospital encounter of 07/03/11 (from the past 48 hour(s))  CBC     Status: Abnormal   Collection Time   07/04/11  1:48 AM      Component Value Range Comment   WBC 4.3  4.0 - 10.5 (K/uL)    RBC 3.78 (*) 3.87 - 5.11 (MIL/uL)    Hemoglobin 11.9 (*) 12.0 - 15.0 (g/dL)    HCT 34.7 (*) 42.5 - 46.0 (%)    MCV 91.3  78.0 - 100.0 (fL)    MCH 31.5  26.0 - 34.0 (pg)    MCHC 34.5  30.0 - 36.0 (g/dL)    RDW 95.6  38.7 - 56.4 (%)    Platelets 154  150 - 400 (K/uL)   DIFFERENTIAL     Status: Abnormal   Collection Time   07/04/11  1:48 AM      Component Value Range Comment   Neutrophils Relative 70  43 - 77 (%)    Neutro Abs 3.0  1.7 - 7.7 (K/uL)    Lymphocytes Relative 16  12 - 46 (%)    Lymphs Abs 0.7  0.7 - 4.0 (K/uL)    Monocytes Relative 13 (*) 3 - 12 (%)    Monocytes Absolute 0.5  0.1 - 1.0 (K/uL)    Eosinophils Relative 2  0 - 5 (%)    Eosinophils Absolute 0.1  0.0 - 0.7 (K/uL)    Basophils Relative 0  0 - 1 (%)    Basophils Absolute 0.0  0.0 - 0.1 (K/uL)   CK TOTAL AND CKMB     Status: Normal   Collection Time   07/04/11  1:49 AM      Component Value Range Comment   Total CK 133  7 - 177 (U/L)    CK, MB 1.9  0.3 - 4.0 (ng/mL)    Relative Index 1.4  0.0 - 2.5    POCT I-STAT TROPONIN I     Status: Normal   Collection Time   07/04/11  2:15 AM      Component Value Range Comment   Troponin i, poc 0.01  0.00 - 0.08 (ng/mL)    Comment 3            POCT I-STAT, CHEM 8     Status: Abnormal   Collection Time   07/04/11  2:29 AM      Component Value Range Comment   Sodium 139  135 - 145 (mEq/L)    Potassium 3.6  3.5 - 5.1 (mEq/L)    Chloride 104  96 - 112 (mEq/L)    BUN 5 (*) 6 - 23 (mg/dL)    Creatinine, Ser 3.32  0.50 - 1.10  (mg/dL)    Glucose, Bld 96  70 - 99 (mg/dL)    Calcium, Ion 9.51  1.12 - 1.32 (mmol/L)    TCO2 24  0 - 100 (mmol/L)    Hemoglobin 13.6  12.0 - 15.0 (g/dL)    HCT 16.1  09.6 - 04.5 (%)   POCT I-STAT 3, BLOOD GAS (G3+)     Status: Abnormal   Collection Time   07/04/11  3:14 AM      Component Value Range Comment   pH, Arterial 7.354  7.350 - 7.400     pCO2 arterial 40.7  35.0 - 45.0 (mmHg)    pO2, Arterial 69.0 (*) 80.0 - 100.0 (mmHg)    Bicarbonate 22.2  20.0 - 24.0 (mEq/L)    TCO2 23  0 - 100 (mmol/L)    O2 Saturation 90.0      Acid-base deficit 2.0  0.0 - 2.0 (mmol/L)    Patient temperature 102.8 F      Collection site RADIAL, ALLEN'S TEST ACCEPTABLE      Drawn by RT      Sample type ARTERIAL       Radiological Exams on Admission: 07/03/2011 - Chest X-ray - Mild right lower lobe opacity persists though decreased in the interval. Cardiomegaly with central vascular congestion. (Please note that Chest x-ray from yesterday did not show pulmonary vascular congestion).  Assessment/Plan  Principal Problem:  *PNA (pneumonia) - per pt's symptoms and CXR findings this is consistent with PNA - review of records indicate that pt has similar episodes of PNA in the past with intubation as a result of combination of PNA and asthma exacerbation - will admit to step down floor and provide supportive care - continue antibiotics Zithromax and Rocephin, hold of on IVF since second CXR read pulmonary vascular congestion - continue nebulizers and solumedrol given wheezing and plan tapering of solumedrol once pt able to tolerate PO and symptoms improving - encourage incentive spirometry - if no significant improvement, would consider CT chest for further evaluation  Active Problems:  OBSTRUCTIVE SLEEP APNEA - continue to monitor vitals signs per floor protocol   HYPERTENSION - continue Labetalol and initiate Hydralazine PRN - pt may need additional antihypertensive prior to discharge but will  let primary team to decide based on the control during the hospitalization   ASTHMA - pt symptoms are primarily due to PNA but possible in combination with asthma - ABG shows mild hypoxia - continue oxygen by nasal canula and monitor - pt has not required BiPAP   BIPOLAR DISORDER UNSPECIFIED - stable   DISPOSITION - plan of care and diagnosis, diagnostic studies and test results were discussed with pt and family at bedside - pt and  family verbalized understanding  Time Spent on Admission: Over 30 minutes  MAGICK-Kaegan Stigler 07/04/2011, 4:59 AM  [facility]  [phone]

## 2011-07-04 NOTE — ED Provider Notes (Addendum)
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 During the night she started getting cough, fever, SOB and was seen here earlier today and diagnosed with pneumonia. She relates she went home and had worsening DOE and returns.  Pt getting her second nebulizer and she is still tachypneic, with retractions and wheezing.  Primary care physician Dr. Leretha Dykes  Medical screening examination/treatment/procedure(s) were conducted as a shared visit with non-physician practitioner(s) and myself.  I personally evaluated the patient during the encounter  Results for orders placed during the hospital encounter of 07/03/11  CBC      Component Value Range   WBC 4.3  4.0 - 10.5 (K/uL)   RBC 3.78 (*) 3.87 - 5.11 (MIL/uL)   Hemoglobin 11.9 (*) 12.0 - 15.0 (g/dL)   HCT 16.1 (*) 09.6 - 46.0 (%)   MCV 91.3  78.0 - 100.0 (fL)   MCH 31.5  26.0 - 34.0 (pg)   MCHC 34.5  30.0 - 36.0 (g/dL)   RDW 04.5  40.9 - 81.1 (%)   Platelets 154  150 - 400 (K/uL)  DIFFERENTIAL      Component Value Range   Neutrophils Relative 70  43 - 77 (%)   Neutro Abs 3.0  1.7 - 7.7 (K/uL)   Lymphocytes Relative 16  12 - 46 (%)   Lymphs Abs 0.7  0.7 - 4.0 (K/uL)   Monocytes Relative 13 (*) 3 - 12 (%)   Monocytes Absolute 0.5  0.1 - 1.0 (K/uL)   Eosinophils Relative 2  0 - 5 (%)   Eosinophils Absolute 0.1  0.0 - 0.7 (K/uL)   Basophils Relative 0  0 - 1 (%)   Basophils Absolute 0.0  0.0 - 0.1 (K/uL)  CK TOTAL AND CKMB      Component Value Range   Total CK 133  7 - 177 (U/L)   CK, MB 1.9  0.3 - 4.0 (ng/mL)   Relative Index 1.4  0.0 - 2.5   POCT I-STAT TROPONIN I      Component Value Range   Troponin i, poc 0.01  0.00 - 0.08 (ng/mL)   Comment 3           POCT I-STAT, CHEM 8      Component Value Range   Sodium 139  135 - 145 (mEq/L)   Potassium 3.6  3.5 - 5.1 (mEq/L)   Chloride 104  96 - 112 (mEq/L)   BUN 5 (*) 6 - 23 (mg/dL)   Creatinine, Ser 9.14  0.50 - 1.10 (mg/dL)   Glucose, Bld 96  70 - 99 (mg/dL)   Calcium, Ion 7.82  9.56 - 1.32 (mmol/L)   TCO2 24  0 - 100 (mmol/L)   Hemoglobin 13.6  12.0 - 15.0 (g/dL)   HCT 21.3  08.6 - 57.8 (%)   Dg Chest 2 View  07/04/2011  *RADIOLOGY REPORT*  Clinical Data: Increasing shortness of breath, fever.  CHEST - 2 VIEW  Comparison: 07/03/2011  Findings: Right lower lobe opacity is similar to slightly improved in the interval.  Cardiomegaly with central vascular congestion. Central peribronchial cuffing.  No pleural effusion or pneumothorax.  No acute osseous abnormality.  IMPRESSION: Mild right lower lobe opacity persists though decreased in the interval.  Cardiomegaly with central vascular congestion.  Peribronchial cuffing may represent bronchitis or edema.  Original Report Authenticated By: Waneta Martins, M.D.   Dg Chest 2 View  07/03/2011  *  RADIOLOGY REPORT*  Clinical Data: Cough, fever, shortness of breath.  Bodyaches. Vomiting.  CHEST - 2 VIEW  Comparison: 04/02/2008  Findings: The heart is enlarged.  There is patchy infiltrate involving the right lung base.  No evidence for pulmonary edema. Visualized osseous structures have a normal appearance.  IMPRESSION:  1.  Right lower lobe infiltrate. 2.  Cardiomegaly without pulmonary edema.  Original Report Authenticated By: Patterson Hammersmith, M.D.    03:46 Dr Izola Price admit to step down, team 7 Pt states she is feeling better, still has some tachypnea but she is able to talk in complete sentences.     Devoria Albe, MD, FACEP   Ward Givens, MD 07/04/11 Salley Hews  Ward Givens, MD 07/08/11 2101

## 2011-07-05 LAB — COMPREHENSIVE METABOLIC PANEL
ALT: 13 U/L (ref 0–35)
Alkaline Phosphatase: 64 U/L (ref 39–117)
CO2: 20 mEq/L (ref 19–32)
Chloride: 107 mEq/L (ref 96–112)
GFR calc Af Amer: >90 mL/min (ref >90)
GFR calc non Af Amer: >90 mL/min (ref >90)
Glucose, Bld: 159 mg/dL — ABNORMAL HIGH (ref 70–99)
Potassium: 4.1 mEq/L (ref 3.5–5.1)
Sodium: 140 mEq/L (ref 135–145)

## 2011-07-05 LAB — CBC
Hemoglobin: 12.4 g/dL (ref 12.0–15.0)
RBC: 4.05 MIL/uL (ref 3.87–5.11)
WBC: 7.6 10*3/uL (ref 4.0–10.5)

## 2011-07-05 MED ORDER — IPRATROPIUM BROMIDE 0.02 % IN SOLN
0.5000 mg | Freq: Three times a day (TID) | RESPIRATORY_TRACT | Status: DC
  Administered 2011-07-05 – 2011-07-09 (×13): 0.5 mg via RESPIRATORY_TRACT
  Filled 2011-07-05 (×13): qty 2.5

## 2011-07-05 MED ORDER — ALBUTEROL SULFATE (5 MG/ML) 0.5% IN NEBU
2.5000 mg | INHALATION_SOLUTION | Freq: Four times a day (QID) | RESPIRATORY_TRACT | Status: DC | PRN
  Filled 2011-07-05: qty 0.5

## 2011-07-05 MED ORDER — FUROSEMIDE 10 MG/ML IJ SOLN
40.0000 mg | Freq: Once | INTRAMUSCULAR | Status: AC
  Administered 2011-07-05: 40 mg via INTRAVENOUS
  Filled 2011-07-05: qty 4

## 2011-07-05 MED ORDER — HYDRALAZINE HCL 50 MG PO TABS
50.0000 mg | ORAL_TABLET | Freq: Four times a day (QID) | ORAL | Status: DC
  Administered 2011-07-05 – 2011-07-07 (×7): 50 mg via ORAL
  Filled 2011-07-05 (×11): qty 1

## 2011-07-05 MED ORDER — ALBUTEROL SULFATE (5 MG/ML) 0.5% IN NEBU
2.5000 mg | INHALATION_SOLUTION | Freq: Three times a day (TID) | RESPIRATORY_TRACT | Status: DC
  Administered 2011-07-05 – 2011-07-09 (×13): 2.5 mg via RESPIRATORY_TRACT
  Filled 2011-07-05 (×12): qty 0.5

## 2011-07-05 MED ORDER — CLONIDINE HCL 0.2 MG PO TABS
0.2000 mg | ORAL_TABLET | Freq: Three times a day (TID) | ORAL | Status: DC
  Administered 2011-07-05 (×2): 0.2 mg via ORAL
  Filled 2011-07-05 (×5): qty 1

## 2011-07-05 NOTE — Progress Notes (Signed)
Patient ID: Paula Fitzgerald, female   DOB: July 06, 1976, 35 y.o.   MRN: [id number] Patient ID: Paula Fitzgerald, female   DOB: 02-Jul-1976, 35 y.o.   MRN: [id number] Subjective: Patient seen.Feels better.Less cough and no chest pain  Objective: Weight change:   Intake/Output Summary (Last 24 hours) at 07/05/11 1428 Last data filed at 07/05/11 0057  Gross per 24 hour  Intake      3 ml  Output      0 ml  Net      3 ml   BP 186/99  Pulse 84  Temp(Src) 98.1 F (36.7 C) (Oral)  Resp 18  Ht 5\' 3"  (1.6 m)  Wt 139.7 kg (307 lb 15.7 oz)  BMI 54.56 kg/m2  SpO2 95%  LMP 05/24/2011 Physical Exam: General appearance:obese, alert, cooperative and no distress Head: Normocephalic, without obvious abnormality, atraumatic Neck: no adenopathy, no carotid bruit, no JVD, supple, symmetrical, trachea midline and thyroid not enlarged, symmetric, no tenderness/mass/nodules Lungs: minimal rhonchi with bibasal rales Heart: regular rate and rhythm, S1, S2 normal, no murmur, click, rub or gallop Abdomen: soft, non-tender; bowel sounds normal; no masses,  no organomegaly Extremities: extremities normal, atraumatic, no cyanosis or edema Skin: Skin color, texture, turgor normal. No rashes or lesions  Lab Results: Results for orders placed during the hospital encounter of 07/03/11 (from the past 48 hour(s))  CBC     Status: Abnormal   Collection Time   07/04/11  1:48 AM      Component Value Range Comment   WBC 4.3  4.0 - 10.5 (K/uL)    RBC 3.78 (*) 3.87 - 5.11 (MIL/uL)    Hemoglobin 11.9 (*) 12.0 - 15.0 (g/dL)    HCT 78.2 (*) 95.6 - 46.0 (%)    MCV 91.3  78.0 - 100.0 (fL)    MCH 31.5  26.0 - 34.0 (pg)    MCHC 34.5  30.0 - 36.0 (g/dL)    RDW 21.3  08.6 - 57.8 (%)    Platelets 154  150 - 400 (K/uL)   DIFFERENTIAL     Status: Abnormal   Collection Time   07/04/11  1:48 AM      Component Value Range Comment   Neutrophils Relative 70  43 - 77 (%)    Neutro Abs 3.0  1.7 - 7.7 (K/uL)    Lymphocytes Relative 16  12 - 46 (%)    Lymphs Abs 0.7  0.7 - 4.0 (K/uL)    Monocytes Relative 13 (*) 3 - 12 (%)    Monocytes Absolute 0.5  0.1 - 1.0 (K/uL)    Eosinophils Relative 2  0 - 5 (%)    Eosinophils Absolute 0.1  0.0 - 0.7 (K/uL)    Basophils Relative 0  0 - 1 (%)    Basophils Absolute 0.0  0.0 - 0.1 (K/uL)   CK TOTAL AND CKMB     Status: Normal   Collection Time   07/04/11  1:49 AM      Component Value Range Comment   Total CK 133  7 - 177 (U/L)    CK, MB 1.9  0.3 - 4.0 (ng/mL)    Relative Index 1.4  0.0 - 2.5    POCT I-STAT TROPONIN I     Status: Normal   Collection Time   07/04/11  2:15 AM      Component Value Range Comment   Troponin i, poc 0.01  0.00 - 0.08 (ng/mL)    Comment 3  POCT I-STAT, CHEM 8     Status: Abnormal   Collection Time   07/04/11  2:29 AM      Component Value Range Comment   Sodium 139  135 - 145 (mEq/L)    Potassium 3.6  3.5 - 5.1 (mEq/L)    Chloride 104  96 - 112 (mEq/L)    BUN 5 (*) 6 - 23 (mg/dL)    Creatinine, Ser 1.61  0.50 - 1.10 (mg/dL)    Glucose, Bld 96  70 - 99 (mg/dL)    Calcium, Ion 0.96  1.12 - 1.32 (mmol/L)    TCO2 24  0 - 100 (mmol/L)    Hemoglobin 13.6  12.0 - 15.0 (g/dL)    HCT 04.5  40.9 - 81.1 (%)   POCT I-STAT 3, BLOOD GAS (G3+)     Status: Abnormal   Collection Time   07/04/11  3:14 AM      Component Value Range Comment   pH, Arterial 7.354  7.350 - 7.400     pCO2 arterial 40.7  35.0 - 45.0 (mmHg)    pO2, Arterial 69.0 (*) 80.0 - 100.0 (mmHg)    Bicarbonate 22.2  20.0 - 24.0 (mEq/L)    TCO2 23  0 - 100 (mmol/L)    O2 Saturation 90.0      Acid-base deficit 2.0  0.0 - 2.0 (mmol/L)    Patient temperature 102.8 F      Collection site RADIAL, ALLEN'S TEST ACCEPTABLE      Drawn by RT      Sample type ARTERIAL     COMPREHENSIVE METABOLIC PANEL     Status: Abnormal   Collection Time   07/04/11  5:01 AM      Component Value Range Comment   Sodium 137  135 - 145 (mEq/L)    Potassium 3.7  3.5 - 5.1 (mEq/L)      Chloride 105  96 - 112 (mEq/L)    CO2 24  19 - 32 (mEq/L)    Glucose, Bld 97  70 - 99 (mg/dL)    BUN 6  6 - 23 (mg/dL)    Creatinine, Ser 9.14  0.50 - 1.10 (mg/dL)    Calcium 8.2 (*) 8.4 - 10.5 (mg/dL)    Total Protein 6.4  6.0 - 8.3 (g/dL)    Albumin 3.0 (*) 3.5 - 5.2 (g/dL)    AST 16  0 - 37 (U/L)    ALT 13  0 - 35 (U/L)    Alkaline Phosphatase 64  39 - 117 (U/L)    Total Bilirubin 0.2 (*) 0.3 - 1.2 (mg/dL)    GFR calc non Af Amer 87 (*) >90 (mL/min)    GFR calc Af Amer >90  >90 (mL/min)   MAGNESIUM     Status: Normal   Collection Time   07/04/11  5:01 AM      Component Value Range Comment   Magnesium 1.6  1.5 - 2.5 (mg/dL)   PHOSPHORUS     Status: Normal   Collection Time   07/04/11  5:01 AM      Component Value Range Comment   Phosphorus 2.9  2.3 - 4.6 (mg/dL)   TSH     Status: Abnormal   Collection Time   07/04/11  5:01 AM      Component Value Range Comment   TSH 0.301 (*) 0.350 - 4.500 (uIU/mL)   HEMOGLOBIN A1C     Status: Abnormal   Collection Time   07/04/11  5:01  AM      Component Value Range Comment   Hemoglobin A1C 5.7 (*) <5.7 (%)    Mean Plasma Glucose 117 (*) <117 (mg/dL)   PRO B NATRIURETIC PEPTIDE     Status: Abnormal   Collection Time   07/04/11  5:01 AM      Component Value Range Comment   Pro B Natriuretic peptide (BNP) 346.0 (*) 0 - 125 (pg/mL)   GLUCOSE, CAPILLARY     Status: Abnormal   Collection Time   07/04/11  6:18 AM      Component Value Range Comment   Glucose-Capillary 129 (*) 70 - 99 (mg/dL)   GLUCOSE, CAPILLARY     Status: Abnormal   Collection Time   07/04/11  8:36 AM      Component Value Range Comment   Glucose-Capillary 100 (*) 70 - 99 (mg/dL)   URINE RAPID DRUG SCREEN (HOSP PERFORMED)     Status: Abnormal   Collection Time   07/04/11 11:50 AM      Component Value Range Comment   Opiates NONE DETECTED  NONE DETECTED     Cocaine NONE DETECTED  NONE DETECTED     Benzodiazepines NONE DETECTED  NONE DETECTED     Amphetamines  NONE DETECTED  NONE DETECTED     Tetrahydrocannabinol POSITIVE (*) NONE DETECTED     Barbiturates NONE DETECTED  NONE DETECTED    CBC     Status: Normal   Collection Time   07/05/11  6:30 AM      Component Value Range Comment   WBC 7.6  4.0 - 10.5 (K/uL)    RBC 4.05  3.87 - 5.11 (MIL/uL)    Hemoglobin 12.4  12.0 - 15.0 (g/dL)    HCT 04.5  40.9 - 81.1 (%)    MCV 89.9  78.0 - 100.0 (fL)    MCH 30.6  26.0 - 34.0 (pg)    MCHC 34.1  30.0 - 36.0 (g/dL)    RDW 91.4  78.2 - 95.6 (%)    Platelets 163  150 - 400 (K/uL)   COMPREHENSIVE METABOLIC PANEL     Status: Abnormal   Collection Time   07/05/11  6:30 AM      Component Value Range Comment   Sodium 140  135 - 145 (mEq/L)    Potassium 4.1  3.5 - 5.1 (mEq/L)    Chloride 107  96 - 112 (mEq/L)    CO2 20  19 - 32 (mEq/L)    Glucose, Bld 159 (*) 70 - 99 (mg/dL)    BUN 8  6 - 23 (mg/dL)    Creatinine, Ser 2.13  0.50 - 1.10 (mg/dL)    Calcium 9.1  8.4 - 10.5 (mg/dL)    Total Protein 7.0  6.0 - 8.3 (g/dL)    Albumin 3.1 (*) 3.5 - 5.2 (g/dL)    AST 16  0 - 37 (U/L)    ALT 13  0 - 35 (U/L)    Alkaline Phosphatase 64  39 - 117 (U/L)    Total Bilirubin 0.2 (*) 0.3 - 1.2 (mg/dL)    GFR calc non Af Amer >90  >90 (mL/min)    GFR calc Af Amer >90  >90 (mL/min)   GLUCOSE, CAPILLARY     Status: Abnormal   Collection Time   07/05/11  6:43 AM      Component Value Range Comment   Glucose-Capillary 153 (*) 70 - 99 (mg/dL)    Comment 1 Documented in Chart  Comment 2 Notify Investment banker, operational Results: No results found for this or any previous visit (from the past 240 hour(s)).  Studies/Results: Dg Chest 2 View  07/04/2011  *RADIOLOGY REPORT*  Clinical Data: Increasing shortness of breath, fever.  CHEST - 2 VIEW  Comparison: 07/03/2011  Findings: Right lower lobe opacity is similar to slightly improved in the interval.  Cardiomegaly with central vascular congestion. Central peribronchial cuffing.  No pleural effusion or pneumothorax.  No acute  osseous abnormality.  IMPRESSION: Mild right lower lobe opacity persists though decreased in the interval.  Cardiomegaly with central vascular congestion.  Peribronchial cuffing may represent bronchitis or edema.  Original Report Authenticated By: Waneta Martins, M.D.   Dg Chest 2 View  07/03/2011  *RADIOLOGY REPORT*  Clinical Data: Cough, fever, shortness of breath.  Bodyaches. Vomiting.  CHEST - 2 VIEW  Comparison: 04/02/2008  Findings: The heart is enlarged.  There is patchy infiltrate involving the right lung base.  No evidence for pulmonary edema. Visualized osseous structures have a normal appearance.  IMPRESSION:  1.  Right lower lobe infiltrate. 2.  Cardiomegaly without pulmonary edema.  Original Report Authenticated By: Patterson Hammersmith, M.D.   Medications: Scheduled Meds:    . albuterol  2.5 mg Nebulization TID  . albuterol      . azithromycin  500 mg Intravenous Q24H  . cefTRIAXone (ROCEPHIN)  IV  1 g Intravenous Q24H  . cloNIDine  0.2 mg Oral TID  . azithromycin (ZITHROMAX) 500 MG IVPB      . enoxaparin  40 mg Subcutaneous Daily  . furosemide  40 mg Intravenous Once  . guaiFENesin  600 mg Oral BID  . hydrALAZINE  50 mg Oral Q6H  . ipratropium      . ipratropium  0.5 mg Nebulization TID  . labetalol  100 mg Oral BID  . methylPREDNISolone (SOLU-MEDROL) injection  60 mg Intravenous Q8H  . olmesartan  40 mg Oral Daily  . sodium chloride  3 mL Intravenous Q12H  . DISCONTD: albuterol  2.5 mg Nebulization Q4H  . DISCONTD: cloNIDine  0.1 mg Oral BID  . DISCONTD: ipratropium  0.5 mg Nebulization Q4H   Continuous Infusions:    . DISCONTD: sodium chloride 20 mL/hr at 07/04/11 0700   PRN Meds:.albuterol, morphine, ondansetron (ZOFRAN) IV, ondansetron, oxyCODONE, sodium chloride, DISCONTD: sodium chloride, DISCONTD: hydrALAZINE  Assessment/Plan: #1 Acute Asthmatic Attack-Patient improved.will continue breathing treatment and iv solumedrol.Will d/c iv fluid and give one dose  of lasix #2 PNA-will continue iv rocephin and zithromax  #3 BIPOLAR DISORDER UNSPECIFIED  #4 OBSTRUCTIVE SLEEP APNEA-cpap at night #5 HTN-Bp labile.Will add hydralazine po to regimen in addition to benicar #6 Obesity Consult physical therapy for ambulation.   LOS: 2 days   Leandrea Ackley 07/05/2011, 2:28 PM

## 2011-07-06 ENCOUNTER — Other Ambulatory Visit: Payer: Self-pay

## 2011-07-06 LAB — GLUCOSE, CAPILLARY: Glucose-Capillary: 154 mg/dL — ABNORMAL HIGH (ref 70–99)

## 2011-07-06 LAB — CBC
HCT: 37.9 % (ref 36.0–46.0)
Hemoglobin: 13.2 g/dL (ref 12.0–15.0)
MCHC: 34.8 g/dL (ref 30.0–36.0)
MCV: 90 fL (ref 78.0–100.0)
RDW: 14.6 % (ref 11.5–15.5)

## 2011-07-06 LAB — COMPREHENSIVE METABOLIC PANEL
Albumin: 3.1 g/dL — ABNORMAL LOW (ref 3.5–5.2)
BUN: 12 mg/dL (ref 6–23)
Chloride: 107 mEq/L (ref 96–112)
Creatinine, Ser: 0.76 mg/dL (ref 0.50–1.10)
GFR calc Af Amer: >90 mL/min (ref >90)
Glucose, Bld: 143 mg/dL — ABNORMAL HIGH (ref 70–99)
Total Bilirubin: 0.2 mg/dL — ABNORMAL LOW (ref 0.3–1.2)
Total Protein: 6.6 g/dL (ref 6.0–8.3)

## 2011-07-06 MED ORDER — CLONIDINE HCL 0.3 MG PO TABS
0.3000 mg | ORAL_TABLET | Freq: Three times a day (TID) | ORAL | Status: DC
  Administered 2011-07-06 – 2011-07-08 (×8): 0.3 mg via ORAL
  Filled 2011-07-06 (×9): qty 1

## 2011-07-06 MED ORDER — PREDNISONE 50 MG PO TABS
50.0000 mg | ORAL_TABLET | Freq: Every day | ORAL | Status: DC
  Administered 2011-07-07 – 2011-07-09 (×3): 50 mg via ORAL
  Filled 2011-07-06 (×5): qty 1

## 2011-07-06 MED ORDER — CLONIDINE HCL 0.1 MG PO TABS
0.1000 mg | ORAL_TABLET | ORAL | Status: AC
  Administered 2011-07-06: 0.1 mg via ORAL
  Filled 2011-07-06: qty 1

## 2011-07-06 MED ORDER — WHITE PETROLATUM GEL
Status: AC
  Administered 2011-07-06: 08:00:00
  Filled 2011-07-06: qty 5

## 2011-07-06 NOTE — Progress Notes (Signed)
Utilization Review Completed.Adriella Essex T12/19/2012   

## 2011-07-06 NOTE — Progress Notes (Signed)
   CARE MANAGEMENT NOTE 07/06/2011  Patient:  Paula Fitzgerald, Paula Fitzgerald   Account Number:  [id number]  Date Initiated:  07/06/2011  Documentation initiated by:  Onnie Boer  Subjective/Objective Assessment:   PT IS ADMITTED WITH ASTHMA     Action/Plan:   PROGRESSION OF CARE AND DISCHARGE PLANNING   Anticipated DC Date:  07/08/2011   Anticipated DC Plan:  HOME W HOME HEALTH SERVICES      DC Planning Services  CM consult      Choice offered to / List presented to:             Status of service:  In process, will continue to follow Medicare Important Message given?   (If response is "NO", the following Medicare IM given date fields will be blank) Date Medicare IM given:   Date Additional Medicare IM given:    Discharge Disposition:    Per UR Regulation:  Reviewed for med. necessity/level of care/duration of stay  Comments:  07/05/2011 Onnie Boer, RN, BSN 1513 PT WAS ADMITTED FROM HOME WITH ASTHMA EXACERBATION.  TODAY PT IS HAVING ISSUES WITH HER BP.  AFTER MED ADJUSTMENTS PT SHOULD BE ABLE TO DC HOME WITH SELF CARE.  WILL F/U.

## 2011-07-06 NOTE — Progress Notes (Signed)
PT order received. Pt reported she has been walking around in the room and does not feel she needs any therapy. Completed a screen and pt is safe to ambulate in the room or halls without assistance. PT evaluation not completed and pt is d/c from PT services due to I with mobility.

## 2011-07-06 NOTE — Progress Notes (Signed)
NP-Pt's BP pretty high all night. Have given extra doses of Clonidine with minor reduction, but then back up to 200 range. Nurse called me to floor stating pt said she was nauseated with HA and some CP. However, upon arrival, pt is well appearing, smiling, talking in bed in NAD. Doesn't appear toxic at all. EKG showed some sinus arrhythmia but NSR with rate of 68 on tele which I just ordered. RRR. Lungs CTA. No belly pain with palpation. She presently denies nausea, CP or SOB. Has HA. Pt says that she has never had BP this high, but has been non compliant in the past with BP med. Says she was on Labetalol during pregnancy and lost her insurance, so she didn't take any longer. Also, on Diovan, but says she hasn't had any BP meds at least 3 weeks prior to this hospital admission. Given her lack of distress, I think the BP is causing her fleeting sx and especially the HA. No neuro changes. Will report to day staff. Consider renal artery US/angio to r/o RAS. Maren Reamer, NP [facility]

## 2011-07-06 NOTE — Progress Notes (Signed)
Subjective: Patient seen and examined this am. Overnight had uncontrolled BP. BP meds were adjusted. Informs her SOB and wheezing to be better.   Objective:  Vital signs in last 24 hours:  Filed Vitals:   07/06/11 0600 07/06/11 0856 07/06/11 0905 07/06/11 1007  BP: 213/105 171/96  150/85  Pulse: 66 72  82  Temp: 98.6 F (37 C)   97.6 F (36.4 C)  TempSrc:    Oral  Resp: 20   18  Height:      Weight:      SpO2: 97%  97% 94%    Intake/Output from previous day:  No intake or output data in the 24 hours ending 07/06/11 1430  Physical Exam:  General: middle aged obese female  in no acute distress. HEENT: no pallor, no icterus, moist oral mucosa, no JVD, no lymphadenopathy Heart: Normal  s1 &s2  Regular rate and rhythm, without murmurs, rubs, gallops. Lungs: scattered ronchi.  Abdomen: Soft, nontender, nondistended, positive bowel sounds. Extremities: No clubbing cyanosis or edema with positive pedal pulses. Neuro: Alert, awake, oriented x3, nonfocal.   Lab Results:  Basic Metabolic Panel:    Component Value Date/Time   NA 141 07/06/2011 0555   K 4.0 07/06/2011 0555   CL 107 07/06/2011 0555   CO2 24 07/06/2011 0555   BUN 12 07/06/2011 0555   CREATININE 0.76 07/06/2011 0555   GLUCOSE 143* 07/06/2011 0555   CALCIUM 8.9 07/06/2011 0555   CBC:    Component Value Date/Time   WBC 12.9* 07/06/2011 0555   HGB 13.2 07/06/2011 0555   HCT 37.9 07/06/2011 0555   PLT 183 07/06/2011 0555   MCV 90.0 07/06/2011 0555   NEUTROABS 3.0 07/04/2011 0148   LYMPHSABS 0.7 07/04/2011 0148   MONOABS 0.5 07/04/2011 0148   EOSABS 0.1 07/04/2011 0148   BASOSABS 0.0 07/04/2011 0148    No results found for this or any previous visit (from the past 240 hour(s)).  Studies/Results: No results found.  Medications: Scheduled Meds:   . albuterol  2.5 mg Nebulization TID  . azithromycin  500 mg Intravenous Q24H  . cefTRIAXone (ROCEPHIN)  IV  1 g Intravenous Q24H  . cloNIDine  0.1 mg  Oral NOW  . cloNIDine  0.3 mg Oral TID  . enoxaparin  40 mg Subcutaneous Daily  . furosemide  40 mg Intravenous Once  . guaiFENesin  600 mg Oral BID  . hydrALAZINE  50 mg Oral Q6H  . ipratropium  0.5 mg Nebulization TID  . labetalol  100 mg Oral BID  . methylPREDNISolone (SOLU-MEDROL) injection  60 mg Intravenous Q8H  . olmesartan  40 mg Oral Daily  . sodium chloride  3 mL Intravenous Q12H  . white petrolatum      . DISCONTD: cloNIDine  0.2 mg Oral TID   Continuous Infusions:  PRN Meds:.albuterol, morphine, ondansetron (ZOFRAN) IV, ondansetron, oxyCODONE, sodium chloride  Assessment/Plan: 35 y/o obese female with hx of asthma, HTN, OSA,  admitted with community acquired PNA and asthma exacerbation with uncontrolled HTN.   PLAN:   community acquired PNA  Clinically Improving .   cont IV ceftraizxone and azitrho ( day 3 - started on 12/17)  Asthma exacerbation  improving  cont nebs Will switch to po prednisone today Cont mucinex  Uncontrolled HTN BP meds adjusted clonidine dose increased  cont hydralazine, benicar and amlod Cont labetalol CXR does show cardiomegaly. Will check 2 D echo to evaluate LV fn given chr uncontrolled HTN  OSA Stable. ?Not  on CPAP at home   Morbid obesity Needs counseling on weight reduction and outpt follow up on discharge  DVT prophylaxis  LOS: 3 days   Nuri Branca 07/06/2011, 2:30 PM

## 2011-07-07 LAB — URINALYSIS, ROUTINE W REFLEX MICROSCOPIC
Bilirubin Urine: NEGATIVE
Hgb urine dipstick: NEGATIVE
Ketones, ur: NEGATIVE mg/dL
Nitrite: NEGATIVE
Urobilinogen, UA: 0.2 mg/dL (ref 0.0–1.0)
pH: 7 (ref 5.0–8.0)

## 2011-07-07 LAB — GLUCOSE, CAPILLARY: Glucose-Capillary: 154 mg/dL — ABNORMAL HIGH (ref 70–99)

## 2011-07-07 MED ORDER — AZITHROMYCIN 500 MG PO TABS
500.0000 mg | ORAL_TABLET | Freq: Every day | ORAL | Status: DC
  Administered 2011-07-08 – 2011-07-09 (×2): 500 mg via ORAL
  Filled 2011-07-07 (×2): qty 1

## 2011-07-07 MED ORDER — HYDRALAZINE HCL 50 MG PO TABS
100.0000 mg | ORAL_TABLET | Freq: Four times a day (QID) | ORAL | Status: DC
  Administered 2011-07-07 – 2011-07-08 (×4): 100 mg via ORAL
  Filled 2011-07-07 (×7): qty 2

## 2011-07-07 NOTE — Progress Notes (Signed)
  Echocardiogram 2D Echocardiogram has been performed.  Mercy Moore 07/07/2011, 3:28 PM

## 2011-07-07 NOTE — Progress Notes (Signed)
In report from previous shift this RN was notified that telemetry was discontinued yesterday morning, however no order to d/c cardiac monitor was found. Paged provider on call to verify order.Awaitng call back.

## 2011-07-07 NOTE — Progress Notes (Signed)
PHARMACIST - PHYSICIAN COMMUNICATION DR:   Gonzella Lex and Colleagues CONCERNING: Antibiotic IV to Oral Route Change Policy  RECOMMENDATION: This patient is receiving Azithromycin by the intravenous route.  Based on criteria approved by the Pharmacy and Therapeutics Committee, the antibiotic(s) is/are being converted to the equivalent oral dose form(s).   DESCRIPTION: These criteria include:  Patient being treated for a respiratory tract infection, urinary tract infection, or cellulitis  The patient is not neutropenic and does not exhibit a GI malabsorption state  The patient is eating (either orally or via tube) and/or has been taking other orally administered medications for a least 24 hours  The patient is improving clinically and has a Tmax < 100.5  If you have questions about this conversion, please contact the Pharmacy Department  []   [phone] )  Paula Fitzgerald [x]   [phone] )  Paula Fitzgerald  []   [phone] )  [facility] []   [phone] )  [facility]

## 2011-07-07 NOTE — Progress Notes (Signed)
Subjective: Patient seen and examined this am. Denies any symptoms. BP somewhat stable on current regimen.    Objective:  Vital signs in last 24 hours:  Filed Vitals:   07/07/11 0622 07/07/11 0739 07/07/11 1000 07/07/11 1400  BP: 166/89  151/86 167/119  Pulse: 66  70 71  Temp: 98.3 F (36.8 C)  98 F (36.7 C) 98.3 F (36.8 C)  TempSrc: Oral  Oral Oral  Resp: 22  18 18   Height:      Weight:      SpO2: 94% 98% 96% 100%    Intake/Output from previous day:   Intake/Output Summary (Last 24 hours) at 07/07/11 1628 Last data filed at 07/06/11 2300  Gross per 24 hour  Intake    180 ml  Output      0 ml  Net    180 ml    Physical Exam:  General: middle aged obese female in no acute distress.  HEENT: no pallor, no icterus, moist oral mucosa, no JVD, no lymphadenopathy  Heart: Normal s1 &s2 Regular rate and rhythm, without murmurs, rubs, gallops.  Lungs: clear b/l Abdomen: Soft, nontender, nondistended, positive bowel sounds.  Extremities: No clubbing cyanosis or edema with positive pedal pulses.  Neuro: Alert, awake, oriented x3, nonfocal.   Lab Results:  Basic Metabolic Panel:    Component Value Date/Time   NA 141 07/06/2011 0555   K 4.0 07/06/2011 0555   CL 107 07/06/2011 0555   CO2 24 07/06/2011 0555   BUN 12 07/06/2011 0555   CREATININE 0.76 07/06/2011 0555   GLUCOSE 143* 07/06/2011 0555   CALCIUM 8.9 07/06/2011 0555   CBC:    Component Value Date/Time   WBC 12.9* 07/06/2011 0555   HGB 13.2 07/06/2011 0555   HCT 37.9 07/06/2011 0555   PLT 183 07/06/2011 0555   MCV 90.0 07/06/2011 0555   NEUTROABS 3.0 07/04/2011 0148   LYMPHSABS 0.7 07/04/2011 0148   MONOABS 0.5 07/04/2011 0148   EOSABS 0.1 07/04/2011 0148   BASOSABS 0.0 07/04/2011 0148    No results found for this or any previous visit (from the past 240 hour(s)).  Studies/Results: No results found.  Medications: Scheduled Meds:   . albuterol  2.5 mg Nebulization TID  . azithromycin  500 mg  Oral Daily  . cefTRIAXone (ROCEPHIN)  IV  1 g Intravenous Q24H  . cloNIDine  0.3 mg Oral TID  . enoxaparin  40 mg Subcutaneous Daily  . guaiFENesin  600 mg Oral BID  . hydrALAZINE  50 mg Oral Q6H  . ipratropium  0.5 mg Nebulization TID  . labetalol  100 mg Oral BID  . olmesartan  40 mg Oral Daily  . predniSONE  50 mg Oral Q breakfast  . sodium chloride  3 mL Intravenous Q12H  . DISCONTD: azithromycin  500 mg Intravenous Q24H   Continuous Infusions:  PRN Meds:.albuterol, morphine, ondansetron (ZOFRAN) IV, ondansetron, oxyCODONE, sodium chloride  Assessment/Plan:  35 y/o obese female with hx of asthma, HTN, OSA, admitted with community acquired PNA and asthma exacerbation with uncontrolled HTN.   PLAN:  community acquired PNA  Clinically Improving .  cont IV ceftraizxone and azitrho ( day 4 - started on 12/17)   Asthma exacerbation  improving  cont nebs  Switched to po prednisone Cont mucinex   Uncontrolled HTN  BP meds adjusted . Patient was on labetalol as outpt ( followed with Dr frasier almost 3 yrs back) .she was also recommended to be on higher dose for  diovan but could not get it due to insurance. She is not taking her BP meds as outpt . Also does not have a PCP due to insurance issues clonidine dose increased  cont hydralazine, benicar and amlod  Cont labetalol  CXR does show cardiomegaly.pending 2d echo Patient on 5 different BP meds with difficulty controlling BP. Will check UA and  renin aldosterone ratio for primary hyperaldosteronism Will discuss with CM in morning regarding insurance issues with her meds  OSA  Was recommended for CPAP by pulmonary following sleep studies. Pt has a machine at home but hasnt  used it since pregnancy in 2011.  i informed to her that her resistant hypertension could be contributed by OSA and she needs to continue CPAP. No documentation of CPAP settings in the system.   Morbid obesity  Counseled on  weight reduction. Needs outpt  follow up DVT prophylaxis   LOS: 4 days   Telicia Hodgkiss 07/07/2011, 4:28 PM

## 2011-07-08 MED ORDER — CLONIDINE HCL 0.2 MG PO TABS
0.2000 mg | ORAL_TABLET | Freq: Three times a day (TID) | ORAL | Status: DC
  Administered 2011-07-08: 0.2 mg via ORAL
  Filled 2011-07-08 (×4): qty 1

## 2011-07-08 MED ORDER — CAPTOPRIL 25 MG PO TABS
25.0000 mg | ORAL_TABLET | Freq: Two times a day (BID) | ORAL | Status: DC
  Administered 2011-07-08: 25 mg via ORAL
  Filled 2011-07-08 (×3): qty 1

## 2011-07-08 MED ORDER — HYDROCHLOROTHIAZIDE 25 MG PO TABS
25.0000 mg | ORAL_TABLET | Freq: Every day | ORAL | Status: DC
  Administered 2011-07-08 – 2011-07-11 (×3): 25 mg via ORAL
  Filled 2011-07-08 (×4): qty 1

## 2011-07-08 MED ORDER — TERAZOSIN HCL 5 MG PO CAPS
5.0000 mg | ORAL_CAPSULE | Freq: Every day | ORAL | Status: DC
  Administered 2011-07-09 – 2011-07-10 (×3): 5 mg via ORAL
  Filled 2011-07-08 (×5): qty 1

## 2011-07-08 MED ORDER — CARVEDILOL 12.5 MG PO TABS
12.5000 mg | ORAL_TABLET | Freq: Two times a day (BID) | ORAL | Status: DC
  Administered 2011-07-08 – 2011-07-09 (×2): 12.5 mg via ORAL
  Filled 2011-07-08 (×4): qty 1

## 2011-07-08 MED ORDER — HYDRALAZINE HCL 50 MG PO TABS
50.0000 mg | ORAL_TABLET | Freq: Four times a day (QID) | ORAL | Status: DC
  Administered 2011-07-08 – 2011-07-09 (×3): 50 mg via ORAL
  Filled 2011-07-08 (×7): qty 1

## 2011-07-08 NOTE — Progress Notes (Signed)
Subjective: No overnight issues. BP still seems to be uncontrolled  Objective:  Vital signs in last 24 hours:  Filed Vitals:   07/08/11 0600 07/08/11 0825 07/08/11 1400 07/08/11 1555  BP: 159/92  182/98   Pulse: 65  70   Temp: 98 F (36.7 C)  97.6 F (36.4 C)   TempSrc:   Oral   Resp: 18  18   Height:      Weight:      SpO2: 93% 99% 93% 96%    Intake/Output from previous day:  No intake or output data in the 24 hours ending 07/08/11 1725  Physical Exam: General: middle aged obese female in no acute distress.  HEENT: no pallor, no icterus, moist oral mucosa, no JVD, no lymphadenopathy  Heart: Normal s1 &s2 Regular rate and rhythm, without murmurs, rubs, gallops.  Lungs: clear b/l  Abdomen: Soft, nontender, nondistended, positive bowel sounds.  Extremities: No clubbing cyanosis or edema with positive pedal pulses.  Neuro: Alert, awake, oriented x3, nonfocal.    Lab Results:  Basic Metabolic Panel:    Component Value Date/Time   NA 141 07/06/2011 0555   K 4.0 07/06/2011 0555   CL 107 07/06/2011 0555   CO2 24 07/06/2011 0555   BUN 12 07/06/2011 0555   CREATININE 0.76 07/06/2011 0555   GLUCOSE 143* 07/06/2011 0555   CALCIUM 8.9 07/06/2011 0555   CBC:    Component Value Date/Time   WBC 12.9* 07/06/2011 0555   HGB 13.2 07/06/2011 0555   HCT 37.9 07/06/2011 0555   PLT 183 07/06/2011 0555   MCV 90.0 07/06/2011 0555   NEUTROABS 3.0 07/04/2011 0148   LYMPHSABS 0.7 07/04/2011 0148   MONOABS 0.5 07/04/2011 0148   EOSABS 0.1 07/04/2011 0148   BASOSABS 0.0 07/04/2011 0148    No results found for this or any previous visit (from the past 240 hour(s)).  Studies/Results: No results found.  Medications: Scheduled Meds:   . albuterol  2.5 mg Nebulization TID  . azithromycin  500 mg Oral Daily  . cefTRIAXone (ROCEPHIN)  IV  1 g Intravenous Q24H  . cloNIDine  0.3 mg Oral TID  . enoxaparin  40 mg Subcutaneous Daily  . guaiFENesin  600 mg Oral BID  . hydrALAZINE   100 mg Oral Q6H  . hydrochlorothiazide  25 mg Oral Daily  . ipratropium  0.5 mg Nebulization TID  . labetalol  100 mg Oral BID  . olmesartan  40 mg Oral Daily  . predniSONE  50 mg Oral Q breakfast  . sodium chloride  3 mL Intravenous Q12H   Continuous Infusions:  PRN Meds:.albuterol, morphine, ondansetron (ZOFRAN) IV, ondansetron, oxyCODONE, sodium chloride  Assessment/Plan:  35 y/o obese female with hx of asthma, HTN, OSA, admitted with community acquired PNA and asthma exacerbation with uncontrolled HTN.   PLAN:  community acquired PNA  Clinically Improving .  cont IV ceftraizxone and azitrho ( day 5  started on 12/17)   Asthma exacerbation  improving  cont nebs  Switched to po prednisone  Cont mucinex   Uncontrolled HTN  BP meds adjusted . Patient was on labetalol as outpt ( followed with Dr frasier almost 3 yrs back) .she was also recommended to be on higher dose for diovan but could not get it due to insurance. She is not taking her BP meds as outpt . Also does not have a PCP due to insurance issues  clonidine dose increased  CXR does show cardiomegaly. Normal 2d echo  Patient  on 5 different BP meds with difficulty controlling BP. Will check UA and renin aldosterone ratio for primary hyperaldosteronism   Will rearrange meds so that she can buy them at cheaper rate as outpatient .  Will switch labetalol to coreg, add HCTZ, switch olmesartan to captopril. Add terazosin and reduce dose of clonidine. Reduce dose of  hydralazine  OSA  Was recommended for CPAP by pulmonary following sleep studies. Pt has a machine at home but hasnt used it since pregnancy in 2011. i informed to her that her resistant hypertension could be contributed by OSA and she needs to continue CPAP. No documentation of CPAP settings in the system.   Morbid obesity  Counseled on weight reduction. Needs outpt follow up  DVT prophylaxis     CM arranging for outpt follow up with heath serv. Not a safe  discharge given her uncontrolled BP for now.   LOS: 5 days   Jarl Sellitto 07/08/2011, 5:25 PM

## 2011-07-08 NOTE — Progress Notes (Signed)
HEALTH SERVE [facility] APPT FOR Friday JAN 11TH AT 230 PM AND A DOC APPT WITH DR Sherron Flemings ON JAN 17TH AT 1130 AM. Paula Fitzgerald 07/08/2011 [phone] OR [phone]

## 2011-07-08 NOTE — Progress Notes (Signed)
I SPOKE WITH THE PATIENT AND SHE WAS GIVEN HEALTH SERVE INFORMATION AND WAS ARRANGED WITH [employer]NERSHIP FOR HEALTH MANAGEMENT FOLLOWING.  HEALTH SERVE [facility] APPT FOR JAN 11TH AT 230 PM AND DOCTORS APPT WITH DR. GALVIN FOR JAN 17TH AT 1130 AM.  PT WILL REMAIN IN THE HOSPITAL OVER THE WEEKEND, WILL F/U ON Monday.   Willa Rough 07/08/2011 [phone] OR [phone]

## 2011-07-09 MED ORDER — CAPTOPRIL 12.5 MG PO TABS
12.5000 mg | ORAL_TABLET | Freq: Two times a day (BID) | ORAL | Status: DC
  Administered 2011-07-09: 12.5 mg via ORAL
  Filled 2011-07-09 (×3): qty 1

## 2011-07-09 MED ORDER — PREDNISONE 20 MG PO TABS
40.0000 mg | ORAL_TABLET | Freq: Every day | ORAL | Status: DC
  Administered 2011-07-10 – 2011-07-11 (×2): 40 mg via ORAL
  Filled 2011-07-09 (×3): qty 2

## 2011-07-09 MED ORDER — CARVEDILOL 6.25 MG PO TABS
6.2500 mg | ORAL_TABLET | Freq: Two times a day (BID) | ORAL | Status: DC
  Administered 2011-07-09 – 2011-07-11 (×4): 6.25 mg via ORAL
  Filled 2011-07-09 (×6): qty 1

## 2011-07-09 MED ORDER — CLONIDINE HCL 0.1 MG PO TABS
0.1000 mg | ORAL_TABLET | Freq: Three times a day (TID) | ORAL | Status: DC
  Administered 2011-07-09: 0.1 mg via ORAL
  Filled 2011-07-09 (×4): qty 1

## 2011-07-09 MED ORDER — HYDRALAZINE HCL 25 MG PO TABS
25.0000 mg | ORAL_TABLET | Freq: Three times a day (TID) | ORAL | Status: DC
  Administered 2011-07-09 – 2011-07-11 (×5): 25 mg via ORAL
  Filled 2011-07-09 (×8): qty 1

## 2011-07-09 MED ORDER — ALBUTEROL SULFATE (5 MG/ML) 0.5% IN NEBU
2.5000 mg | INHALATION_SOLUTION | Freq: Two times a day (BID) | RESPIRATORY_TRACT | Status: DC
  Administered 2011-07-09 – 2011-07-11 (×3): 2.5 mg via RESPIRATORY_TRACT
  Filled 2011-07-09 (×4): qty 0.5

## 2011-07-09 MED ORDER — IPRATROPIUM BROMIDE 0.02 % IN SOLN
0.5000 mg | Freq: Two times a day (BID) | RESPIRATORY_TRACT | Status: DC
  Administered 2011-07-09 – 2011-07-11 (×3): 0.5 mg via RESPIRATORY_TRACT
  Filled 2011-07-09 (×4): qty 2.5

## 2011-07-09 NOTE — Plan of Care (Signed)
Problem: Phase I Progression Outcomes Goal: Initial discharge plan identified Outcome: Completed/Met Date Met:  07/09/11 D/c plan is to be D/C'd to home when Medically stable.

## 2011-07-09 NOTE — Progress Notes (Signed)
Patient's blood pressure was only 85/53. Dr. Gonzella Lex paged and made aware. He said to hold BP meds and continue to monitor patient's BP and inform him of results. Will continue to monitor. Carman Ching, RN

## 2011-07-09 NOTE — Progress Notes (Signed)
Subjective: Patient seen and examined this morning. Feels better. BP better  this am  Objective:  Vital signs in last 24 hours:  Filed Vitals:   07/08/11 2200 07/09/11 0029 07/09/11 0649 07/09/11 0816  BP: 134/81 143/95 138/78 129/74  Pulse: 81  66 84  Temp: 97.3 F (36.3 C)  97.8 F (36.6 C)   TempSrc: Oral  Oral   Resp: 20  20   Height:      Weight:      SpO2: 98%  97%     Intake/Output from previous day:   Intake/Output Summary (Last 24 hours) at 07/09/11 0916 Last data filed at 07/09/11 0700  Gross per 24 hour  Intake    240 ml  Output      0 ml  Net    240 ml    Physical Exam:  General: middle aged obese female in no acute distress.  HEENT: no pallor, no icterus, moist oral mucosa, no JVD, no lymphadenopathy  Heart: Normal s1 &s2 Regular rate and rhythm, without murmurs, rubs, gallops.  Lungs: clear b/l  Abdomen: Soft, nontender, nondistended, positive bowel sounds.  Extremities: No clubbing cyanosis or edema with positive pedal pulses.  Neuro: Alert, awake, oriented x3, nonfocal.    Lab Results:  Basic Metabolic Panel:    Component Value Date/Time   NA 141 07/06/2011 0555   K 4.0 07/06/2011 0555   CL 107 07/06/2011 0555   CO2 24 07/06/2011 0555   BUN 12 07/06/2011 0555   CREATININE 0.76 07/06/2011 0555   GLUCOSE 143* 07/06/2011 0555   CALCIUM 8.9 07/06/2011 0555   CBC:    Component Value Date/Time   WBC 12.9* 07/06/2011 0555   HGB 13.2 07/06/2011 0555   HCT 37.9 07/06/2011 0555   PLT 183 07/06/2011 0555   MCV 90.0 07/06/2011 0555   NEUTROABS 3.0 07/04/2011 0148   LYMPHSABS 0.7 07/04/2011 0148   MONOABS 0.5 07/04/2011 0148   EOSABS 0.1 07/04/2011 0148   BASOSABS 0.0 07/04/2011 0148    No results found for this or any previous visit (from the past 240 hour(s)).  Studies/Results: No results found.  Medications: Scheduled Meds:   . albuterol  2.5 mg Nebulization TID  . azithromycin  500 mg Oral Daily  . captopril  25 mg Oral BID  .  carvedilol  12.5 mg Oral BID WC  . cefTRIAXone (ROCEPHIN)  IV  1 g Intravenous Q24H  . cloNIDine  0.2 mg Oral TID  . enoxaparin  40 mg Subcutaneous Daily  . guaiFENesin  600 mg Oral BID  . hydrALAZINE  50 mg Oral Q6H  . hydrochlorothiazide  25 mg Oral Daily  . ipratropium  0.5 mg Nebulization TID  . predniSONE  50 mg Oral Q breakfast  . sodium chloride  3 mL Intravenous Q12H  . terazosin  5 mg Oral QHS  . DISCONTD: cloNIDine  0.3 mg Oral TID  . DISCONTD: hydrALAZINE  100 mg Oral Q6H  . DISCONTD: labetalol  100 mg Oral BID  . DISCONTD: olmesartan  40 mg Oral Daily   Continuous Infusions:  PRN Meds:.albuterol, morphine, ondansetron (ZOFRAN) IV, ondansetron, oxyCODONE, sodium chloride  Assessment/Plan: 35 y/o obese female with hx of asthma, HTN, OSA, admitted with community acquired PNA and asthma exacerbation with uncontrolled HTN.  PLAN:  community acquired PNA  Clinically Improving .  cont IV ceftraizxone and azitrho ( day 5 started on 12/17)  Asthma exacerbation  improving  cont nebs  Switched to po prednisone  Cont mucinex  Uncontrolled HTN  BP meds adjusted . Patient was on labetalol as outpt ( followed with Dr frasier almost 3 yrs back) .she was also recommended to be on higher dose for diovan but could not get it due to insurance. She is not taking her BP meds as outpt . Also does not have a PCP due to insurance issues  CXR does show cardiomegaly. Normal 2d echo  Patient on 5 different BP meds with difficulty controlling BP.  check  renin aldosterone ratio for primary hyperaldosteronism . UA wnl Started on labetalol, clonidine, benicar , hydralazine without much improvement  - rearranged meds so that she can buy them at cheaper rate as outpatient .  switched labetalol to coreg, added HCTZ, switch olmesartan to captopril. Add terazosin and reduce dose of clonidine. Reduce dose of hydralazine  BP better this am  OSA  Was recommended for CPAP by pulmonary following sleep  studies. Pt has a machine at home but hasnt used it since pregnancy in 2011. i informed to her that her resistant hypertension could be contributed by OSA and she needs to continue CPAP. No documentation of CPAP settings in the system.   Morbid obesity  Counseled on weight reduction. Needs outpt follow up   DVT prophylaxis    CM arranging for outpt follow up with heath serv. Not a safe discharge given her uncontrolled BP for now.       LOS: 6 days   Afsana Liera 07/09/2011, 9:16 AM

## 2011-07-10 MED ORDER — CLONIDINE HCL 0.1 MG PO TABS
0.1000 mg | ORAL_TABLET | Freq: Two times a day (BID) | ORAL | Status: DC
  Administered 2011-07-10 – 2011-07-11 (×3): 0.1 mg via ORAL
  Filled 2011-07-10 (×4): qty 1

## 2011-07-10 MED ORDER — CAPTOPRIL 6.25 MG HALF TABLET
6.2500 mg | ORAL_TABLET | Freq: Two times a day (BID) | ORAL | Status: DC
  Administered 2011-07-10 – 2011-07-11 (×3): 6.25 mg via ORAL
  Filled 2011-07-10 (×4): qty 1

## 2011-07-10 NOTE — Progress Notes (Addendum)
CSW covering for the weekend visited with pt and briefly and completed psychosocial assessment (pls see shadow chart). Pt was on a phone call, but she reports she only needed assistance with medications and CM Victorino Dike has been outstanding in arranging everything for her regarding follow-up upon discharge. Pt reports she plans to discharge home and she has support from her boyfriend, pt reports she has five children. CSW signing off, please re-consult if needed.   Jennifer's note I SPOKE WITH THE PATIENT AND SHE WAS GIVEN HEALTH SERVE INFORMATION AND WAS ARRANGED WITH [facility] FOR HEALTH MANAGEMENT FOLLOWING. HEALTH SERVE [facility] APPT FOR JAN 11TH AT 230 PM AND DOCTORS APPT WITH DR. GALVIN FOR JAN 17TH AT 1130 AM. PT WILL REMAIN IN THE HOSPITAL OVER THE WEEKEND, WILL F/U ON Monday.  Willa Rough  07/08/2011  [phone] OR [phone]    Patrice Paradise 07/10/2011 10:27 AM [phone]

## 2011-07-10 NOTE — Plan of Care (Signed)
Problem: Discharge Progression Outcomes Goal: Pain controlled with appropriate interventions Outcome: Completed/Met Date Met:  07/10/11 Patient denies any pain at all during this shift. Goal: Hemodynamically stable Outcome: Progressing Continue to monitor blood pressure until discharge. Goal: Tolerating diet Outcome: Completed/Met Date Met:  07/10/11 Patient eating regular diet; tolerating without difficulty. Goal: Activity appropriate for discharge plan Outcome: Completed/Met Date Met:  07/10/11 Patient has been witnessed ambulating independently about her room and deemed able to ambulate without standby assistance.

## 2011-07-10 NOTE — Progress Notes (Signed)
Subjective: Patient seen and examined this am. Feels much better. BP has been much stable overnight.   Objective:  Vital signs in last 24 hours:  Filed Vitals:   07/10/11 0519 07/10/11 0842 07/10/11 1008 07/10/11 1421  BP: 132/82  130/70 138/78  Pulse: 73  80 88  Temp: 98.3 F (36.8 C)  98 F (36.7 C) 98.5 F (36.9 C)  TempSrc: Oral  Oral Oral  Resp: 20  20 20   Height:      Weight:      SpO2: 95% 97% 96% 95%    Intake/Output from previous day:   Intake/Output Summary (Last 24 hours) at 07/10/11 1506 Last data filed at 07/10/11 1311  Gross per 24 hour  Intake   1320 ml  Output      0 ml  Net   1320 ml    Physical Exam:  General: middle aged obese female in no acute distress.  HEENT: no pallor, no icterus, moist oral mucosa, no JVD, no lymphadenopathy  Heart: Normal s1 &s2 Regular rate and rhythm, without murmurs, rubs, gallops.  Lungs: clear b/l  Abdomen: Soft, nontender, nondistended, positive bowel sounds.  Extremities: No clubbing cyanosis or edema with positive pedal pulses.  Neuro: Alert, awake, oriented x3, nonfocal.  Lab Results:  Basic Metabolic Panel:    Component Value Date/Time   NA 141 07/06/2011 0555   K 4.0 07/06/2011 0555   CL 107 07/06/2011 0555   CO2 24 07/06/2011 0555   BUN 12 07/06/2011 0555   CREATININE 0.76 07/06/2011 0555   GLUCOSE 143* 07/06/2011 0555   CALCIUM 8.9 07/06/2011 0555   CBC:    Component Value Date/Time   WBC 12.9* 07/06/2011 0555   HGB 13.2 07/06/2011 0555   HCT 37.9 07/06/2011 0555   PLT 183 07/06/2011 0555   MCV 90.0 07/06/2011 0555   NEUTROABS 3.0 07/04/2011 0148   LYMPHSABS 0.7 07/04/2011 0148   MONOABS 0.5 07/04/2011 0148   EOSABS 0.1 07/04/2011 0148   BASOSABS 0.0 07/04/2011 0148    No results found for this or any previous visit (from the past 240 hour(s)).  Studies/Results: No results found.  Medications: Scheduled Meds:   . albuterol  2.5 mg Nebulization BID  . captopril  6.25 mg Oral BID  .  carvedilol  6.25 mg Oral BID WC  . cloNIDine  0.1 mg Oral BID  . enoxaparin  40 mg Subcutaneous Daily  . guaiFENesin  600 mg Oral BID  . hydrALAZINE  25 mg Oral Q8H  . hydrochlorothiazide  25 mg Oral Daily  . ipratropium  0.5 mg Nebulization BID  . predniSONE  40 mg Oral Q breakfast  . sodium chloride  3 mL Intravenous Q12H  . terazosin  5 mg Oral QHS  . DISCONTD: captopril  12.5 mg Oral BID  . DISCONTD: captopril  25 mg Oral BID  . DISCONTD: carvedilol  12.5 mg Oral BID WC  . DISCONTD: cloNIDine  0.1 mg Oral TID  . DISCONTD: cloNIDine  0.2 mg Oral TID  . DISCONTD: hydrALAZINE  50 mg Oral Q6H   Continuous Infusions:  PRN Meds:.albuterol, morphine, ondansetron (ZOFRAN) IV, ondansetron, oxyCODONE, sodium chloride  Assessment/Plan:   Assessment/Plan:  35 y/o obese female with hx of asthma, HTN, OSA, admitted with community acquired PNA and asthma exacerbation with uncontrolled HTN.   PLAN:   community acquired PNA  improved Completed 5 days of ceftriaxone and azithro on 12/21  Asthma exacerbation  improving  cont prn nebs  Switched  to po prednisone  Cont mucinex   Uncontrolled HTN  BP meds adjusted . Patient was on labetalol as outpt ( followed with Dr frasier almost 3 yrs back) .she was also recommended to be on higher dose for diovan but could not get it due to insurance. She is not taking her BP meds as outpt . Also does not have a PCP due to insurance issues  CXR does show cardiomegaly. Normal 2d echo  Patient on 5 different BP meds with difficulty controlling BP. Checking  renin aldosterone ratio for primary hyperaldosteronism . UA wnl  Started on labetalol, clonidine, benicar , hydralazine without much improvement  - rearranged meds so that she can buy them at cheaper rate as outpatient .  switched labetalol to coreg, added HCTZ, switch olmesartan to captopril. Add terazosin and reduce dose of clonidine. Reduce dose of hydralazine  BP improved. Dose now reduced as pt  was hypotensive yesterday.  Hopefully should be able to titrate BP meds to lower doses and fewer no of medications upon discharge  OSA  Was recommended for CPAP by pulmonary following sleep studies. Pt has a machine at home but hasnt used it since pregnancy in 2011. i informed to her that her resistant hypertension could be contributed by OSA and she needs to continue CPAP. No documentation of CPAP settings in the system.   Morbid obesity  Counseled on weight reduction. Needs outpt follow up   DVT prophylaxis    CM arranging for outpt follow up with heath serv. Possibly d/c in am if BP remains stable       LOS: 7 days   Andi Layfield 07/10/2011, 3:06 PM

## 2011-07-11 DIAGNOSIS — I10 Essential (primary) hypertension: Secondary | ICD-10-CM | POA: Diagnosis present

## 2011-07-11 MED ORDER — HYDROCHLOROTHIAZIDE 25 MG PO TABS: 25.0000 mg | ORAL_TABLET | Freq: Every day | ORAL | Status: DC

## 2011-07-11 MED ORDER — CAPTOPRIL 6.25 MG HALF TABLET: 6.2500 mg | ORAL_TABLET | Freq: Two times a day (BID) | ORAL | Status: DC

## 2011-07-11 MED ORDER — GUAIFENESIN ER 600 MG PO TB12: 600.0000 mg | ORAL_TABLET | Freq: Two times a day (BID) | ORAL | Status: DC

## 2011-07-11 MED ORDER — PREDNISONE 20 MG PO TABS: 40.0000 mg | ORAL_TABLET | Freq: Every day | ORAL | Status: AC

## 2011-07-11 MED ORDER — ALBUTEROL 90 MCG/ACT IN AERS: 2.0000 | INHALATION_SPRAY | Freq: Four times a day (QID) | RESPIRATORY_TRACT | Status: DC | PRN

## 2011-07-11 MED ORDER — CARVEDILOL 6.25 MG PO TABS: 6.2500 mg | ORAL_TABLET | Freq: Two times a day (BID) | ORAL | Status: DC

## 2011-07-11 MED ORDER — CLONIDINE HCL 0.1 MG PO TABS: 0.1000 mg | ORAL_TABLET | Freq: Two times a day (BID) | ORAL | Status: DC

## 2011-07-11 NOTE — Progress Notes (Signed)
   CARE MANAGEMENT NOTE 07/11/2011  Patient:  Paula Fitzgerald, Paula Fitzgerald   Account Number:  [id number]  Date Initiated:  07/06/2011  Documentation initiated by:  Onnie Boer  Subjective/Objective Assessment:   PT IS ADMITTED WITH ASTHMA     Action/Plan:   PROGRESSION OF CARE AND DISCHARGE PLANNING   Anticipated DC Date:  07/08/2011   Anticipated DC Plan:  HOME W HOME HEALTH SERVICES      DC Planning Services  CM consult      Choice offered to / List presented to:             Status of service:  Completed, signed off Medicare Important Message given?   (If response is "NO", the following Medicare IM given date fields will be blank) Date Medicare IM given:   Date Additional Medicare IM given:    Discharge Disposition:  HOME/SELF CARE  Per UR Regulation:  Reviewed for med. necessity/level of care/duration of stay  Comments:  07/11/2011 Onnie Boer, RN, BSN 1529 PT WAS DC'D TO HOME WITH SELF CARE WITH BP MEDS ON THE $4 LIST AND A HEALTH SERVE APPT FOR ELIG ON JAN 11TH AND A HEALTHSERVE DR APPT WITH DR Sherron Flemings ON JAN 17TH.  PT ALSO REFERED TO [facility].  09/04/2010 Onnie Boer, RN, BSN 1513 PT WAS ADMITTED FROM HOME WITH ASTHMA EXACERBATION.  TODAY PT IS HAVING ISSUES WITH HER BP.  AFTER MED ADJUSTMENTS PT SHOULD BE ABLE TO DC HOME WITH SELF CARE.  WILL F/U.

## 2011-07-11 NOTE — Discharge Summary (Signed)
Patient ID: Paula Fitzgerald MRN: [id number] DOB/AGE: 16-Oct-1975 35 y.o.  Admit date: 07/03/2011 Discharge date: 07/11/2011  Primary Care Physician:  No primary provider on file.  Discharge Diagnoses:    Present on Admission:  community acquired bacterial PNA (pneumonia) .Severe uncontrolled hypertension .Morbid obesity acute asthma exacerbation .BIPOLAR DISORDER UNSPECIFIED .OBSTRUCTIVE SLEEP APNEA     Current Discharge Medication List    START taking these medications   Details  captopril (CAPOTEN) 6.25 mg TABS Take 0.5 tablets (6.25 mg total) by mouth 2 (two) times daily. Qty: 60 tablet, Refills: 0    carvedilol (COREG) 6.25 MG tablet Take 1 tablet (6.25 mg total) by mouth 2 (two) times daily with a meal. Qty: 60 tablet, Refills: 0    cloNIDine (CATAPRES) 0.1 MG tablet Take 1 tablet (0.1 mg total) by mouth 2 (two) times daily. Qty: 60 tablet, Refills: 2    guaiFENesin (MUCINEX) 600 MG 12 hr tablet Take 1 tablet (600 mg total) by mouth 2 (two) times daily. Qty: 10 tablet, Refills: 0    hydrochlorothiazide (HYDRODIURIL) 25 MG tablet Take 1 tablet (25 mg total) by mouth daily. Qty: 30 tablet, Refills: 2    predniSONE (DELTASONE) 20 MG tablet Take 2 tablets (40 mg total) by mouth daily with breakfast. Qty: 8 tablet, Refills: 0      CONTINUE these medications which have NOT CHANGED   Details  ibuprofen (ADVIL,MOTRIN) 200 MG tablet Take 400 mg by mouth every 6 (six) hours as needed. Fever and pain      STOP taking these medications     azithromycin (ZITHROMAX) 250 MG tablet      ondansetron (ZOFRAN-ODT) 8 MG disintegrating tablet      labetalol (NORMODYNE) 100 MG tablet         Disposition and Follow-up:  Patient to follow up with health serv as outpatient on jan 07 for epigibility  Consults:   none  Significant Diagnostic Studies:  Dg Chest 2 View  07/04/2011  *RADIOLOGY REPORT*  Clinical Data: Increasing shortness of breath, fever.  CHEST - 2  VIEW  Comparison: 07/03/2011  Findings: Right lower lobe opacity is similar to slightly improved in the interval.  Cardiomegaly with central vascular congestion. Central peribronchial cuffing.  No pleural effusion or pneumothorax.  No acute osseous abnormality.  IMPRESSION: Mild right lower lobe opacity persists though decreased in the interval.  Cardiomegaly with central vascular congestion.  Peribronchial cuffing may represent bronchitis or edema.  Original Report Authenticated By: Waneta Martins, M.D.    Brief H and P: For complete details please refer to admission H and P, but in brief 35 yo female with PMH outlined below who presents to [facility] ED with main concern of progressively worsening shortness of breath associated with fevers as high as 102 F, wheezing, productive cough of yellowish sputum. Symptoms have been getting progressively worse and have initially started 1-2 days prior to admission. Pt was seen in ED earlier this AM and was sent home with antibiotic prescription and was told she has CAP. Her SOB was getting worse and she came back. Pt reports having asthma but is not taking any scheduled medications for it since she has not has any recent flare ups and she mentions that only time her asthma flares up is when she gets sick with PNA for example.Pt also reports having bipolar disorder and HTN but was not taking any medications for it at home. She denies recent sick contacts or exposures, no recent hospitalizations, no recent  traveling. She also denies abdominal or urinary concerns, no specific focal weakness. She does report chest tightness only with coughing, and the pain is nonradiating, she denies palpitations, no orthopnea or PND, no lower extremity edema.      Physical Exam on Discharge:  Filed Vitals:   07/10/11 2146 07/10/11 2227 07/11/11 0629 07/11/11 0847  BP: 154/99  125/83   Pulse: 80 67 86   Temp: 98.4 F (36.9 C)  98.7 F (37.1 C)   TempSrc: Oral  Oral   Resp: 20 14  18    Height:      Weight:      SpO2: 95% 98% 97% 97%     Intake/Output Summary (Last 24 hours) at 07/11/11 1121 Last data filed at 07/10/11 1311  Gross per 24 hour  Intake    360 ml  Output      0 ml  Net    360 ml    General: morbidly obese female in no acute distress. HEENT: no pallor, moist oral mucosa Heart: Regular rate and rhythm, without murmurs, rubs, gallops. Lungs: Clear to auscultation bilaterally. Abdomen: Soft, nontender, nondistended, positive bowel sounds. Extremities: No clubbing cyanosis or edema with positive pedal pulses. Neuro: Alert, awake, oriented x3,Grossly intact, nonfocal.  CBC:    Component Value Date/Time   WBC 12.9* 07/06/2011 0555   HGB 13.2 07/06/2011 0555   HCT 37.9 07/06/2011 0555   PLT 183 07/06/2011 0555   MCV 90.0 07/06/2011 0555   NEUTROABS 3.0 07/04/2011 0148   LYMPHSABS 0.7 07/04/2011 0148   MONOABS 0.5 07/04/2011 0148   EOSABS 0.1 07/04/2011 0148   BASOSABS 0.0 07/04/2011 0148    Basic Metabolic Panel:    Component Value Date/Time   NA 141 07/06/2011 0555   K 4.0 07/06/2011 0555   CL 107 07/06/2011 0555   CO2 24 07/06/2011 0555   BUN 12 07/06/2011 0555   CREATININE 0.76 07/06/2011 0555   GLUCOSE 143* 07/06/2011 0555   CALCIUM 8.9 07/06/2011 0555    Hospital Course:   community acquired PNA  improved  Completed 5 days of ceftriaxone and azithro on 12/21   Asthma exacerbation  Improved with IV solumedrol and nebs cont prn nebs  Switched to po prednisone  Cont mucinex  Will d/c on 4 more days of po prednisone and albuterol puffd  Uncontrolled severe HTN  BP meds adjusted . Patient was on labetalol as outpt ( followed with Dr frasier almost 3 yrs back) .she was also recommended to be on higher dose for diovan but could not get it due to insurance. She is not taking her BP meds as outpt . Also does not have a PCP due to insurance issues  CXR does show cardiomegaly. Normal 2d echo  Patient started on 5 different BP  meds with difficulty controlling BP. Checking renin aldosterone ratio for primary hyperaldosteronism . UA wnl  Started on labetalol, clonidine, benicar , hydralazine without much improvement  - rearranged meds so that she can buy them at cheaper rate as outpatient .  switched labetalol to coreg, added HCTZ, switch olmesartan to captopril. Added terazosin and reduce dose of clonidine. Reduced dose of hydralazine  BP improved. Dose readjusted and she is now being discharged on 4 different BP medications which will titrated as outpatient once she gets her PCP.   OSA  Was recommended for CPAP by pulmonary following sleep studies. Pt has a machine at home but hasnt used it since pregnancy in 2011. i informed to her that  her resistant hypertension could be contributed by OSA and she needs to continue CPAP. No documentation of CPAP settings in the system.  She would need a pulmonary clinic referral once she is assigned a PCP  Morbid obesity  Counseled on weight reduction. Needs outpt follow up   Patient clinically stable  for discharge home.   Time spent on Discharge: 45 minutes  Signed: Eddie North 07/11/2011, 11:21 AM

## 2011-07-20 LAB — ALDOSTERONE + RENIN ACTIVITY W/ RATIO: Aldosterone: <1

## 2012-01-18 ENCOUNTER — Encounter (HOSPITAL_COMMUNITY): Payer: Self-pay | Admitting: *Deleted

## 2012-01-18 ENCOUNTER — Emergency Department (HOSPITAL_COMMUNITY)
Admission: EM | Admit: 2012-01-18 | Discharge: 2012-01-18 | Disposition: A | Discharge status: Home / Self Care | Payer: Medicaid Other | Attending: Emergency Medicine | Admitting: Emergency Medicine

## 2012-01-18 DIAGNOSIS — R1031 Right lower quadrant pain: Secondary | ICD-10-CM | POA: Insufficient documentation

## 2012-01-18 HISTORY — DX: Obesity, unspecified: E66.9

## 2012-01-18 LAB — BASIC METABOLIC PANEL
CO2: 28 mEq/L (ref 19–32)
Chloride: 99 mEq/L (ref 96–112)
GFR calc Af Amer: 89 mL/min — ABNORMAL LOW (ref >90)
Potassium: 2.9 mEq/L — ABNORMAL LOW (ref 3.5–5.1)
Sodium: 142 mEq/L (ref 135–145)

## 2012-01-18 LAB — DIFFERENTIAL
Eosinophils Relative: 4 % (ref 0–5)
Lymphocytes Relative: 31 % (ref 12–46)
Lymphs Abs: 2.9 10*3/uL (ref 0.7–4.0)
Monocytes Absolute: 0.6 10*3/uL (ref 0.1–1.0)
Neutro Abs: 5.4 10*3/uL (ref 1.7–7.7)

## 2012-01-18 LAB — CBC
HCT: 39.4 % (ref 36.0–46.0)
Hemoglobin: 13.7 g/dL (ref 12.0–15.0)
MCV: 91 fL (ref 78.0–100.0)
RBC: 4.33 MIL/uL (ref 3.87–5.11)
WBC: 9.4 10*3/uL (ref 4.0–10.5)

## 2012-01-18 NOTE — ED Notes (Signed)
Lab results reviewed, low potassium noted, pt eloped after triage.

## 2012-01-18 NOTE — ED Notes (Signed)
Called in waiting room x 2 no answer  

## 2012-01-18 NOTE — ED Notes (Signed)
Pt is here for RLQ pain which began Sunday.  She states that the pain has been increasing and she has nausea with this.  Pt states that she has been constipated, last "real" BM was Saturday.  She has a small BM Sunday and today.  Pt denies any pain or burning with urination.  Pt denies any fever or chills.  Vomited x1 on monday

## 2012-01-19 ENCOUNTER — Encounter (HOSPITAL_COMMUNITY): Payer: Self-pay | Admitting: Emergency Medicine

## 2012-01-19 ENCOUNTER — Emergency Department (HOSPITAL_COMMUNITY): Payer: Medicaid Other

## 2012-01-19 ENCOUNTER — Emergency Department (HOSPITAL_COMMUNITY)
Admission: EM | Admit: 2012-01-19 | Discharge: 2012-01-19 | Disposition: A | Discharge status: Home / Self Care | Payer: Medicaid Other | Attending: Emergency Medicine | Admitting: Emergency Medicine

## 2012-01-19 DIAGNOSIS — R143 Flatulence: Secondary | ICD-10-CM | POA: Insufficient documentation

## 2012-01-19 DIAGNOSIS — R141 Gas pain: Secondary | ICD-10-CM | POA: Insufficient documentation

## 2012-01-19 DIAGNOSIS — R1031 Right lower quadrant pain: Secondary | ICD-10-CM | POA: Insufficient documentation

## 2012-01-19 DIAGNOSIS — R109 Unspecified abdominal pain: Secondary | ICD-10-CM

## 2012-01-19 DIAGNOSIS — I10 Essential (primary) hypertension: Secondary | ICD-10-CM | POA: Insufficient documentation

## 2012-01-19 DIAGNOSIS — R1110 Vomiting, unspecified: Secondary | ICD-10-CM | POA: Insufficient documentation

## 2012-01-19 DIAGNOSIS — M545 Low back pain, unspecified: Secondary | ICD-10-CM | POA: Insufficient documentation

## 2012-01-19 DIAGNOSIS — R197 Diarrhea, unspecified: Secondary | ICD-10-CM | POA: Insufficient documentation

## 2012-01-19 DIAGNOSIS — J45909 Unspecified asthma, uncomplicated: Secondary | ICD-10-CM | POA: Insufficient documentation

## 2012-01-19 DIAGNOSIS — Z79899 Other long term (current) drug therapy: Secondary | ICD-10-CM | POA: Insufficient documentation

## 2012-01-19 DIAGNOSIS — R142 Eructation: Secondary | ICD-10-CM | POA: Insufficient documentation

## 2012-01-19 LAB — CBC WITH DIFFERENTIAL/PLATELET
Basophils Relative: 0 % (ref 0–1)
HCT: 39.6 % (ref 36.0–46.0)
Hemoglobin: 13.8 g/dL (ref 12.0–15.0)
Lymphs Abs: 2.4 10*3/uL (ref 0.7–4.0)
MCHC: 34.8 g/dL (ref 30.0–36.0)
Monocytes Absolute: 1 10*3/uL (ref 0.1–1.0)
Monocytes Relative: 9 % (ref 3–12)
Neutro Abs: 7.4 10*3/uL (ref 1.7–7.7)
Neutrophils Relative %: 66 % (ref 43–77)
RBC: 4.36 MIL/uL (ref 3.87–5.11)

## 2012-01-19 LAB — BASIC METABOLIC PANEL
BUN: 14 mg/dL (ref 6–23)
CO2: 29 mEq/L (ref 19–32)
Chloride: 99 mEq/L (ref 96–112)
Creatinine, Ser: 0.97 mg/dL (ref 0.50–1.10)
GFR calc Af Amer: 85 mL/min — ABNORMAL LOW (ref >90)
Glucose, Bld: 120 mg/dL — ABNORMAL HIGH (ref 70–99)
Potassium: 3.1 mEq/L — ABNORMAL LOW (ref 3.5–5.1)

## 2012-01-19 LAB — URINE MICROSCOPIC-ADD ON

## 2012-01-19 LAB — POCT PREGNANCY, URINE: Preg Test, Ur: NEGATIVE

## 2012-01-19 LAB — URINALYSIS, ROUTINE W REFLEX MICROSCOPIC
Bilirubin Urine: NEGATIVE
Glucose, UA: NEGATIVE mg/dL
Ketones, ur: NEGATIVE mg/dL
Specific Gravity, Urine: >1.046 — ABNORMAL HIGH (ref 1.005–1.030)
pH: 7 (ref 5.0–8.0)

## 2012-01-19 MED ORDER — HYDROMORPHONE HCL PF 1 MG/ML IJ SOLN
1.0000 mg | Freq: Once | INTRAMUSCULAR | Status: AC
  Administered 2012-01-19: 1 mg via INTRAVENOUS
  Filled 2012-01-19: qty 1

## 2012-01-19 MED ORDER — POTASSIUM CHLORIDE 10 MEQ/100ML IV SOLN
10.0000 meq | Freq: Once | INTRAVENOUS | Status: AC
  Administered 2012-01-19: 10 meq via INTRAVENOUS
  Filled 2012-01-19: qty 100

## 2012-01-19 MED ORDER — ONDANSETRON HCL 4 MG PO TABS: 4.0000 mg | ORAL_TABLET | Freq: Four times a day (QID) | ORAL | Status: AC

## 2012-01-19 MED ORDER — ONDANSETRON HCL 4 MG/2ML IJ SOLN
4.0000 mg | Freq: Once | INTRAMUSCULAR | Status: AC
  Administered 2012-01-19: 4 mg via INTRAVENOUS
  Filled 2012-01-19: qty 2

## 2012-01-19 MED ORDER — OXYCODONE-ACETAMINOPHEN 5-325 MG PO TABS: 2.0000 | ORAL_TABLET | ORAL | Status: AC | PRN

## 2012-01-19 MED ORDER — SODIUM CHLORIDE 0.9 % IV BOLUS (SEPSIS)
1000.0000 mL | Freq: Once | INTRAVENOUS | Status: AC
  Administered 2012-01-19: 1000 mL via INTRAVENOUS

## 2012-01-19 MED ORDER — IOHEXOL 300 MG/ML  SOLN
100.0000 mL | Freq: Once | INTRAMUSCULAR | Status: AC | PRN
  Administered 2012-01-19: 100 mL via INTRAVENOUS

## 2012-01-19 MED ORDER — IOHEXOL 300 MG/ML  SOLN
20.0000 mL | INTRAMUSCULAR | Status: AC
  Administered 2012-01-19 (×2): 20 mL via ORAL

## 2012-01-19 NOTE — ED Notes (Signed)
Patient transported to CT 

## 2012-01-19 NOTE — ED Notes (Signed)
PT. REPORTS RIGHT ABDOMINAL / RIGHT LOW BACK PAIN  ONSET LAST Saturday WITH VOMITTING AND DIARRHEA , ALSO REPORTS RIGHT THIGH PAIN .

## 2012-01-19 NOTE — ED Notes (Signed)
Collected stool per NP, in pt. Room waiting for order. Pt states she could not produce a urine sample but will try later. Pt ambulated to RR with assistance abut needed wheelchair to return to room.

## 2012-01-19 NOTE — ED Provider Notes (Signed)
History     CSN: [id number]  Arrival date & time 01/19/12  0143   First MD Initiated Contact with Patient 01/19/12 0231      Chief Complaint  Patient presents with  . Abdominal Pain    (Consider location/radiation/quality/duration/timing/severity/associated sxs/prior treatment) Patient is a 36 y.o. female presenting with abdominal pain. The history is provided by the patient and the spouse. No language interpreter was used.  Abdominal Pain The primary symptoms of the illness include abdominal pain, vomiting and diarrhea. The primary symptoms of the illness do not include fever, shortness of breath, nausea, dysuria, vaginal discharge or vaginal bleeding. The onset of the illness was gradual.  The patient states that she believes she is currently not pregnant. Symptoms associated with the illness do not include frequency.   36 year old female coming in for the second time today with right lower quadrant pain/lower back pain.  States she came earlier it was too crowded she left. She then went home and laid down and the pain became so severe she came back. States that she did have some green diarrhea in the meantime. Denies vaginal discharge or bleeding. Denies fever,   Past Medical History  Diagnosis Date  . Asthma   . Hypertension   . Obesity     Past Surgical History  Procedure Date  . Tubal ligation     No family history on file.  History  Substance Use Topics  . Smoking status: Former Smoker    Quit date: 07/03/2009  . Smokeless tobacco: Not on file  . Alcohol Use: No    OB History    Grav Para Term Preterm Abortions TAB SAB Ect Mult Living                  Review of Systems  Constitutional: Negative.  Negative for fever.  HENT: Negative.   Eyes: Negative.   Respiratory: Negative.  Negative for shortness of breath.   Cardiovascular: Negative.   Gastrointestinal: Positive for vomiting, abdominal pain and diarrhea. Negative for nausea.  Genitourinary: Negative  for dysuria, frequency, flank pain, vaginal bleeding, vaginal discharge and difficulty urinating.  Neurological: Negative.   Psychiatric/Behavioral: Negative.   All other systems reviewed and are negative.    Allergies  Review of patient's allergies indicates no known allergies.  Home Medications   Current Outpatient Rx  Name Route Sig Dispense Refill  . CARVEDILOL 6.25 MG PO TABS Oral Take 6.25 mg by mouth 2 (two) times daily with a meal.    . CLONIDINE HCL 0.1 MG PO TABS Oral Take 0.1 mg by mouth 2 (two) times daily.    Marland Kitchen HYDROCHLOROTHIAZIDE 25 MG PO TABS Oral Take 25 mg by mouth daily.    Marland Kitchen OMEPRAZOLE MAGNESIUM 20 MG PO TBEC Oral Take 20 mg by mouth daily. Stomach pain    . SIMETHICONE 80 MG PO CHEW Oral Chew 80 mg by mouth every 6 (six) hours as needed. Acid reflux/stomach pain      BP 131/68  Pulse 85  Temp 98.8 F (37.1 C) (Oral)  Resp 16  SpO2 97%  LMP 01/04/2012  Physical Exam  Nursing note and vitals reviewed. Constitutional: She is oriented to person, place, and time. She appears well-developed and well-nourished.  HENT:  Head: Normocephalic.  Eyes: Conjunctivae and EOM are normal. Pupils are equal, round, and reactive to light.  Neck: Normal range of motion. Neck supple.  Cardiovascular: Normal rate.   Pulmonary/Chest: Effort normal.  Abdominal: Soft. Bowel sounds are normal.  She exhibits distension. There is tenderness. There is no rebound and no guarding.  Musculoskeletal: Normal range of motion.  Neurological: She is alert and oriented to person, place, and time.  Skin: Skin is warm and dry.  Psychiatric: She has a normal mood and affect.    ED Course  Pelvic exam Date/Time: 01/19/2012 4:25 AM Performed by: Remi Haggard Authorized by: Remi Haggard Consent: Verbal consent obtained. Risks and benefits: risks, benefits and alternatives were discussed Consent given by: patient Patient understanding: patient states understanding of the procedure being  performed Patient identity confirmed: verbally with patient, arm band, provided demographic data and hospital-assigned identification number Time out: Immediately prior to procedure a "time out" was called to verify the correct patient, procedure, equipment, support staff and site/side marked as required. Local anesthesia used: no Patient sedated: no Patient tolerance: Patient tolerated the procedure well with no immediate complications.   (including critical care time)  Labs Reviewed  CBC WITH DIFFERENTIAL - Abnormal; Notable for the following:    WBC 11.1 (*)     All other components within normal limits  BASIC METABOLIC PANEL - Abnormal; Notable for the following:    Potassium 3.1 (*)     Glucose, Bld 120 (*)     GFR calc non Af Amer 74 (*)     GFR calc Af Amer 85 (*)     All other components within normal limits  LIPASE, BLOOD  URINALYSIS, ROUTINE W REFLEX MICROSCOPIC  CLOSTRIDIUM DIFFICILE BY PCR   Ct Abdomen Pelvis W Contrast  01/19/2012  *RADIOLOGY REPORT*  Clinical Data: Right lower quadrant pain  CT ABDOMEN AND PELVIS WITH CONTRAST  Technique:  Multidetector CT imaging of the abdomen and pelvis was performed following the standard protocol during bolus administration of intravenous contrast.  Contrast: OMNIPAQUE IOHEXOL 300 MG/ML  SOLN  Comparison: None.  Findings: 3 mm nodule right lower lobe.  Focal hypoattenuation within the left hepatic lobe is nonspecific. Diffuse hypoattenuation is in keeping with fatty infiltration. Unremarkable biliary system, spleen, pancreas, adrenal glands.  Symmetric renal enhancement.  No hydronephrosis or hydroureter.  No bowel obstruction.  No CT evidence for colitis.  Normal terminal ileum.  Normal appendix.  No free intraperitoneal air or fluid.  No lymphadenopathy.  Normal caliber vasculature.  Mild bladder wall thickening is nonspecific given incomplete distension.  Uterus and adnexa within normal limits for CT.  No acute osseous finding.   IMPRESSION: Normal appendix.  Mild bladder wall thickening is nonspecific given incomplete distension.  Correlate with urinalysis.  Suggestion of hepatic steatosis.  Too small further characterize hypodensity within the left hepatic lobe.  3 mm right lower lobe pulmonary nodule. If the patient is at high risk for bronchogenic carcinoma, follow-up chest CT at 1 year is recommended.  If the patient is at low risk, no follow-up is needed.  This recommendation follows the consensus statement: Guidelines for Management of Small Pulmonary Nodules Detected on CT Scans:  A Statement from the Fleischner Society as published in Radiology 2005; 237:395-400.  Original Report Authenticated By: Waneta Martins, M.D.     No diagnosis found.    MDM  Gastroenteritis/hypokalemia better after IVF, dilaudid and zofran.  Rx for zofran and percocet.  Urine culture pending.    CT negative for acute process.  -cdiff.   Labs Reviewed  URINALYSIS, ROUTINE W REFLEX MICROSCOPIC - Abnormal; Notable for the following:    Specific Gravity, Urine >1.046 (*)     Leukocytes, UA SMALL (*)  All other components within normal limits  CBC WITH DIFFERENTIAL - Abnormal; Notable for the following:    WBC 11.1 (*)     All other components within normal limits  BASIC METABOLIC PANEL - Abnormal; Notable for the following:    Potassium 3.1 (*)     Glucose, Bld 120 (*)     GFR calc non Af Amer 74 (*)     GFR calc Af Amer 85 (*)     All other components within normal limits  URINE MICROSCOPIC-ADD ON - Abnormal; Notable for the following:    Squamous Epithelial / LPF MANY (*)     All other components within normal limits  LIPASE, BLOOD  CLOSTRIDIUM DIFFICILE BY PCR  POCT PREGNANCY, URINE  URINE CULTURE  GC/CHLAMYDIA PROBE AMP, GENITAL          Remi Haggard, NP 01/20/12 1328

## 2012-01-20 LAB — URINE CULTURE: Colony Count: 50000

## 2012-01-21 LAB — GC/CHLAMYDIA PROBE AMP, GENITAL
Chlamydia, DNA Probe: NEGATIVE
GC Probe Amp, Genital: NEGATIVE

## 2012-01-30 NOTE — ED Provider Notes (Signed)
History/physical exam/procedure(s) were performed by non-physician practitioner and as supervising physician I was immediately available for consultation/collaboration. I have reviewed all notes and am in agreement with care and plan.   Hilario Quarry, MD 01/30/12 [phone]

## 2012-11-03 ENCOUNTER — Encounter (HOSPITAL_COMMUNITY): Payer: Self-pay | Admitting: *Deleted

## 2012-11-03 ENCOUNTER — Emergency Department (HOSPITAL_COMMUNITY)
Admission: EM | Admit: 2012-11-03 | Discharge: 2012-11-04 | Disposition: A | Discharge status: Home / Self Care | Payer: Medicaid Other | Attending: Emergency Medicine | Admitting: Emergency Medicine

## 2012-11-03 DIAGNOSIS — E669 Obesity, unspecified: Secondary | ICD-10-CM | POA: Insufficient documentation

## 2012-11-03 DIAGNOSIS — Y9289 Other specified places as the place of occurrence of the external cause: Secondary | ICD-10-CM | POA: Insufficient documentation

## 2012-11-03 DIAGNOSIS — S239XXA Sprain of unspecified parts of thorax, initial encounter: Secondary | ICD-10-CM | POA: Insufficient documentation

## 2012-11-03 DIAGNOSIS — Z87891 Personal history of nicotine dependence: Secondary | ICD-10-CM | POA: Insufficient documentation

## 2012-11-03 DIAGNOSIS — W010XXA Fall on same level from slipping, tripping and stumbling without subsequent striking against object, initial encounter: Secondary | ICD-10-CM | POA: Insufficient documentation

## 2012-11-03 DIAGNOSIS — Y9301 Activity, walking, marching and hiking: Secondary | ICD-10-CM | POA: Insufficient documentation

## 2012-11-03 DIAGNOSIS — J45909 Unspecified asthma, uncomplicated: Secondary | ICD-10-CM | POA: Insufficient documentation

## 2012-11-03 DIAGNOSIS — Z79899 Other long term (current) drug therapy: Secondary | ICD-10-CM | POA: Insufficient documentation

## 2012-11-03 DIAGNOSIS — I10 Essential (primary) hypertension: Secondary | ICD-10-CM | POA: Insufficient documentation

## 2012-11-03 NOTE — ED Notes (Signed)
The pt is c/o lower back pain.  She fell earlier today and she has had chronic back pain intermittently.  The pain has increased as the night has progressed

## 2012-11-04 MED ORDER — HYDROCODONE-ACETAMINOPHEN 5-325 MG PO TABS: 2.0000 | ORAL_TABLET | ORAL | Status: DC | PRN

## 2012-11-04 MED ORDER — DIAZEPAM 5 MG PO TABS: 5.0000 mg | ORAL_TABLET | Freq: Four times a day (QID) | ORAL | Status: DC | PRN

## 2012-11-04 MED ORDER — DIAZEPAM 5 MG PO TABS
5.0000 mg | ORAL_TABLET | Freq: Once | ORAL | Status: AC
  Administered 2012-11-04: 5 mg via ORAL
  Filled 2012-11-04: qty 1

## 2012-11-04 MED ORDER — KETOROLAC TROMETHAMINE 30 MG/ML IJ SOLN
30.0000 mg | Freq: Once | INTRAMUSCULAR | Status: AC
  Administered 2012-11-04: 30 mg via INTRAMUSCULAR
  Filled 2012-11-04: qty 1

## 2012-11-04 MED ORDER — HYDROMORPHONE HCL PF 1 MG/ML IJ SOLN
1.0000 mg | Freq: Once | INTRAMUSCULAR | Status: AC
  Administered 2012-11-04: 1 mg via INTRAMUSCULAR
  Filled 2012-11-04: qty 1

## 2012-11-04 NOTE — ED Notes (Signed)
Pt comfortable with d/c and f/u. Prescriptions x2

## 2012-11-04 NOTE — ED Provider Notes (Signed)
Medical screening examination/treatment/procedure(s) were performed by non-physician practitioner and as supervising physician I was immediately available for consultation/collaboration.  Donnetta Hutching, MD 11/04/12 [phone]

## 2012-11-04 NOTE — ED Provider Notes (Signed)
History     CSN: [id number]  Arrival date & time 11/03/12  2348   First MD Initiated Contact with Patient 11/04/12 0001      Chief Complaint  Patient presents with  . Back Pain    (Consider location/radiation/quality/duration/timing/severity/associated sxs/prior treatment) HPI Comments: States she was walking and slipped on some gravel and lost her balance never fell to the ground, but toward herself.  She initially had some pain in her thoracic area, which has progressed in intensity over the past 2 hours.  She took one over-the-counter ibuprofen, without relief.  She has a history of a bulging disc, but she is unsure, which disc is bulging as it happened when she was 37 years old.  She has had intermittent pain since then, but nothing that ibuprofen or Tylenol doesn't decrease in intensity  Patient is a 37 y.o. female presenting with back pain. The history is provided by the patient.  Back Pain Location:  Thoracic spine Quality:  Aching Radiates to:  Does not radiate Pain severity:  Severe Onset quality:  Sudden Timing:  Constant Progression:  Unchanged Chronicity:  Recurrent   Past Medical History  Diagnosis Date  . Asthma   . Hypertension   . Obesity     Past Surgical History  Procedure Laterality Date  . Tubal ligation      No family history on file.  History  Substance Use Topics  . Smoking status: Former Smoker    Quit date: 07/03/2009  . Smokeless tobacco: Not on file  . Alcohol Use: No    OB History   Grav Para Term Preterm Abortions TAB SAB Ect Mult Living                  Review of Systems  Respiratory: Negative for cough and shortness of breath.   Musculoskeletal: Positive for back pain. Negative for gait problem.  All other systems reviewed and are negative.    Allergies  Other  Home Medications   Current Outpatient Rx  Name  Route  Sig  Dispense  Refill  . carvedilol (COREG) 6.25 MG tablet   Oral   Take 6.25 mg by mouth 2 (two)  times daily with a meal.         . cloNIDine (CATAPRES) 0.1 MG tablet   Oral   Take 0.1 mg by mouth 2 (two) times daily.         . hydrochlorothiazide (HYDRODIURIL) 25 MG tablet   Oral   Take 25 mg by mouth daily.         . diazepam (VALIUM) 5 MG tablet   Oral   Take 1 tablet (5 mg total) by mouth every 6 (six) hours as needed for anxiety.   12 tablet   0   . HYDROcodone-acetaminophen (NORCO/VICODIN) 5-325 MG per tablet   Oral   Take 2 tablets by mouth every 4 (four) hours as needed for pain.   10 tablet   0     BP 155/114  Pulse 94  Temp(Src) 97.7 F (36.5 C) (Oral)  Resp 20  SpO2 100%  LMP 10/22/2012  Physical Exam  Nursing note and vitals reviewed. Constitutional: She appears well-developed and well-nourished. No distress.  Morbid obesity  HENT:  Head: Normocephalic and atraumatic.  Neck: Normal range of motion.  Cardiovascular: Normal rate and regular rhythm.   Pulmonary/Chest: Effort normal and breath sounds normal.  Musculoskeletal: She exhibits tenderness. She exhibits no edema.       Thoracic  back: She exhibits decreased range of motion, tenderness and pain. She exhibits no bony tenderness, no edema, no deformity and no spasm.  Neurological: She is alert.  Skin: Skin is dry.    ED Course  Procedures (including critical care time)  Labs Reviewed - No data to display No results found.   1. Thoracic back sprain, initial encounter       MDM    we'll treat in the emergency department.  IM, Toradol, and IM, Dilaudid discharge patient home with anti-spasmatic, and pain control.  She has a primary care physician, but can't remember his name 1:12 AM.  Patient has received significant pain, relief, we'll discharge home with by mouth Valium, and Vicodin       Arman Filter, NP 11/04/12 0115

## 2012-11-19 ENCOUNTER — Emergency Department (HOSPITAL_COMMUNITY)
Admission: EM | Admit: 2012-11-19 | Discharge: 2012-11-19 | Disposition: A | Discharge status: Home / Self Care | Payer: Medicaid Other | Attending: Emergency Medicine | Admitting: Emergency Medicine

## 2012-11-19 ENCOUNTER — Encounter (HOSPITAL_COMMUNITY): Payer: Self-pay

## 2012-11-19 DIAGNOSIS — E669 Obesity, unspecified: Secondary | ICD-10-CM | POA: Insufficient documentation

## 2012-11-19 DIAGNOSIS — F329 Major depressive disorder, single episode, unspecified: Secondary | ICD-10-CM | POA: Insufficient documentation

## 2012-11-19 DIAGNOSIS — J45909 Unspecified asthma, uncomplicated: Secondary | ICD-10-CM | POA: Insufficient documentation

## 2012-11-19 DIAGNOSIS — F3289 Other specified depressive episodes: Secondary | ICD-10-CM | POA: Insufficient documentation

## 2012-11-19 DIAGNOSIS — F309 Manic episode, unspecified: Secondary | ICD-10-CM | POA: Insufficient documentation

## 2012-11-19 DIAGNOSIS — I10 Essential (primary) hypertension: Secondary | ICD-10-CM | POA: Insufficient documentation

## 2012-11-19 DIAGNOSIS — F410 Panic disorder [episodic paroxysmal anxiety] without agoraphobia: Secondary | ICD-10-CM | POA: Insufficient documentation

## 2012-11-19 DIAGNOSIS — F32A Depression, unspecified: Secondary | ICD-10-CM

## 2012-11-19 DIAGNOSIS — F17200 Nicotine dependence, unspecified, uncomplicated: Secondary | ICD-10-CM | POA: Insufficient documentation

## 2012-11-19 DIAGNOSIS — R45851 Suicidal ideations: Secondary | ICD-10-CM

## 2012-11-19 HISTORY — DX: Panic disorder (episodic paroxysmal anxiety): F41.0

## 2012-11-19 HISTORY — DX: Major depressive disorder, single episode, unspecified: F32.9

## 2012-11-19 HISTORY — DX: Bipolar disorder, unspecified: F31.9

## 2012-11-19 HISTORY — DX: Depression, unspecified: F32.A

## 2012-11-19 LAB — CBC WITH DIFFERENTIAL/PLATELET
Basophils Relative: 0 % (ref 0–1)
Eosinophils Absolute: 0.6 10*3/uL (ref 0.0–0.7)
Eosinophils Relative: 10 % — ABNORMAL HIGH (ref 0–5)
Lymphs Abs: 2.2 10*3/uL (ref 0.7–4.0)
MCH: 31.3 pg (ref 26.0–34.0)
MCHC: 34.9 g/dL (ref 30.0–36.0)
MCV: 89.6 fL (ref 78.0–100.0)
Platelets: 198 10*3/uL (ref 150–400)
RBC: 4.15 MIL/uL (ref 3.87–5.11)

## 2012-11-19 LAB — BASIC METABOLIC PANEL
BUN: 13 mg/dL (ref 6–23)
Calcium: 8.9 mg/dL (ref 8.4–10.5)
GFR calc non Af Amer: 70 mL/min — ABNORMAL LOW (ref >90)
Glucose, Bld: 107 mg/dL — ABNORMAL HIGH (ref 70–99)
Sodium: 139 mEq/L (ref 135–145)

## 2012-11-19 MED ORDER — ACETAMINOPHEN 325 MG PO TABS: 650.0000 mg | ORAL_TABLET | ORAL | Status: DC | PRN

## 2012-11-19 MED ORDER — HYDROCHLOROTHIAZIDE 25 MG PO TABS
25.0000 mg | ORAL_TABLET | Freq: Every day | ORAL | Status: DC
  Administered 2012-11-19: 25 mg via ORAL
  Filled 2012-11-19: qty 1

## 2012-11-19 MED ORDER — ZOLPIDEM TARTRATE 5 MG PO TABS: 5.0000 mg | ORAL_TABLET | Freq: Every evening | ORAL | Status: DC | PRN

## 2012-11-19 MED ORDER — NICOTINE 21 MG/24HR TD PT24: 21.0000 mg | MEDICATED_PATCH | Freq: Every day | TRANSDERMAL | Status: DC

## 2012-11-19 MED ORDER — LORAZEPAM 1 MG PO TABS: 1.0000 mg | ORAL_TABLET | Freq: Three times a day (TID) | ORAL | Status: DC | PRN

## 2012-11-19 MED ORDER — CLONIDINE HCL 0.1 MG PO TABS
0.1000 mg | ORAL_TABLET | Freq: Two times a day (BID) | ORAL | Status: DC
  Administered 2012-11-19: 0.1 mg via ORAL
  Filled 2012-11-19: qty 1

## 2012-11-19 MED ORDER — CARVEDILOL 6.25 MG PO TABS
6.2500 mg | ORAL_TABLET | Freq: Two times a day (BID) | ORAL | Status: DC
  Administered 2012-11-19: 6.25 mg via ORAL
  Filled 2012-11-19 (×3): qty 1

## 2012-11-19 MED ORDER — ONDANSETRON HCL 4 MG PO TABS: 4.0000 mg | ORAL_TABLET | Freq: Three times a day (TID) | ORAL | Status: DC | PRN
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
## 2012-11-19 NOTE — ED Provider Notes (Signed)
Telepsych, Dr. Jacky Kindle, saw patient and felt that she doesn't need psych admission. Stable for d/c   Richardean Canal, MD 11/19/12 [phone]

## 2012-11-19 NOTE — BH Assessment (Signed)
Assessment Note  Update:  Pt received telepsych and psychiatrist recommended pt be discharged and follow up with outpatient referrals.  Pt denied SI/HI or psychosis to psychiatrist.  EDP Yao in agreement with disposition.  Pt signed a No Harm contract with this Clinical research associate.  Pt stated she was going to follow up with [facility] in the morning and call her therapist she already has in place to make an appt with her.  Updated ED staff and pt to be discharged.  Her friend is here to take her home.  Updated assessment disposition.    Disposition:  Disposition Initial Assessment Completed for this Encounter: Yes Disposition of Patient: Referred to;Outpatient treatment Type of outpatient treatment: Adult Other disposition(s): Other (Comment) (Pending telepsych) Patient referred to: Outpatient clinic referral  On Site Evaluation by:   Reviewed with Physician:  Tacy Learn, Rennis Harding 11/19/2012 6:23 PM

## 2012-11-19 NOTE — ED Notes (Signed)
telepsych reccomendations received. Dr Rulon Abide made aware and will dispo pt.

## 2012-11-19 NOTE — ED Notes (Signed)
Reported to [facility] need of sitter, Security informed of the need to wand pt.

## 2012-11-19 NOTE — ED Notes (Signed)
Pt. Has not take any BP meds in the last 7 days.

## 2012-11-19 NOTE — ED Notes (Signed)
ACT TEAM AT BEDSIDE 

## 2012-11-19 NOTE — ED Notes (Signed)
Waiting for pt telepsych to come back over fax machine

## 2012-11-19 NOTE — ED Notes (Signed)
Pt. Having suicidal thoughts, About 4 weeks ago, started to take a handful of pills. Stopped and only took 3.  Yesterday  Became very depressed and did not have the will to be here.  Pt. Is aware of needing help and knows that she is going into a depressive state.  Pt. Is alert and oriented X 4.

## 2012-11-19 NOTE — ED Notes (Signed)
Snack and soda to pt. Significant other at bedside. Pt oriented to unit and questions answered. Fan to pt room due to room very warm. She is cooperative and appreciative for care provided.

## 2012-11-19 NOTE — ED Provider Notes (Signed)
Medical screening examination/treatment/procedure(s) were performed by non-physician practitioner and as supervising physician I was immediately available for consultation/collaboration. Devoria Albe, MD, Armando Gang   Ward Givens, MD 11/19/12 [phone]

## 2012-11-19 NOTE — ED Provider Notes (Signed)
History     CSN: [id number]  Arrival date & time 11/19/12  0815   First MD Initiated Contact with Patient 11/19/12 [phone]      Chief Complaint  Patient presents with  . Suicidal    (Consider location/radiation/quality/duration/timing/severity/associated sxs/prior treatment) Patient is a 37 y.o. female presenting with mental health disorder. The history is provided by the patient.  Mental Health Problem Severity:  Moderate Associated symptoms: no fever   Associated symptoms comment:  SI without specific plan. She feels generally depressed with h/o bipolar disease. She denies any attempt today. No alcohol or drug abuse issues.   Past Medical History  Diagnosis Date  . Asthma   . Hypertension   . Obesity   . Bipolar 1 disorder   . Depressed   . Panic attacks     Past Surgical History  Procedure Laterality Date  . Tubal ligation      No family history on file.  History  Substance Use Topics  . Smoking status: Current Every Day Smoker    Types: Cigarettes    Last Attempt to Quit: 07/03/2009  . Smokeless tobacco: Not on file  . Alcohol Use: Yes     Comment: sociallly    OB History   Grav Para Term Preterm Abortions TAB SAB Ect Mult Living                  Review of Systems  Constitutional: Negative for fever and chills.  HENT: Negative.   Respiratory: Negative.  Negative for shortness of breath.   Cardiovascular: Negative.  Negative for chest pain.  Gastrointestinal: Negative.   Musculoskeletal: Negative.   Skin: Negative.   Neurological: Negative.   Psychiatric/Behavioral: Positive for suicidal ideas.    Allergies  Other  Home Medications   Current Outpatient Rx  Name  Route  Sig  Dispense  Refill  . carvedilol (COREG) 6.25 MG tablet   Oral   Take 6.25 mg by mouth 2 (two) times daily with a meal.         . cloNIDine (CATAPRES) 0.1 MG tablet   Oral   Take 0.1 mg by mouth 2 (two) times daily.         . diazepam (VALIUM) 5 MG tablet   Oral   Take 1 tablet (5 mg total) by mouth every 6 (six) hours as needed for anxiety.   12 tablet   0   . hydrochlorothiazide (HYDRODIURIL) 25 MG tablet   Oral   Take 25 mg by mouth daily.         Marland Kitchen HYDROcodone-acetaminophen (NORCO/VICODIN) 5-325 MG per tablet   Oral   Take 2 tablets by mouth every 4 (four) hours as needed for pain.   10 tablet   0     BP 154/109  Pulse 75  Temp(Src) 98.1 F (36.7 C) (Oral)  SpO2 99%  LMP 10/22/2012  Physical Exam  Constitutional: She is oriented to person, place, and time. She appears well-developed and well-nourished.  HENT:  Head: Normocephalic.  Neck: Normal range of motion. Neck supple.  Cardiovascular: Normal rate and regular rhythm.   Pulmonary/Chest: Effort normal and breath sounds normal.  Abdominal: Soft. Bowel sounds are normal. There is no tenderness. There is no rebound and no guarding.  Musculoskeletal: Normal range of motion. She exhibits no edema.  Neurological: She is alert and oriented to person, place, and time.  Skin: Skin is warm and dry. No rash noted.  Psychiatric: She has a normal mood and  affect.    ED Course  Procedures (including critical care time)  Labs Reviewed  CBC WITH DIFFERENTIAL  BASIC METABOLIC PANEL  URINE RAPID DRUG SCREEN (HOSP PERFORMED)  PREGNANCY, URINE  ETHANOL   No results found.   No diagnosis found.  1. Suicidal ideation  MDM  To [facility] for evaluation of suicidal thoughts and depression.        Arnoldo Hooker, PA-C 11/19/12 [phone]

## 2012-12-30 ENCOUNTER — Encounter (HOSPITAL_COMMUNITY): Payer: Self-pay | Admitting: *Deleted

## 2012-12-30 DIAGNOSIS — F319 Bipolar disorder, unspecified: Secondary | ICD-10-CM | POA: Insufficient documentation

## 2012-12-30 DIAGNOSIS — F17200 Nicotine dependence, unspecified, uncomplicated: Secondary | ICD-10-CM | POA: Insufficient documentation

## 2012-12-30 DIAGNOSIS — J45909 Unspecified asthma, uncomplicated: Secondary | ICD-10-CM | POA: Insufficient documentation

## 2012-12-30 DIAGNOSIS — I10 Essential (primary) hypertension: Secondary | ICD-10-CM | POA: Insufficient documentation

## 2012-12-30 DIAGNOSIS — F410 Panic disorder [episodic paroxysmal anxiety] without agoraphobia: Secondary | ICD-10-CM | POA: Insufficient documentation

## 2012-12-30 DIAGNOSIS — E669 Obesity, unspecified: Secondary | ICD-10-CM | POA: Insufficient documentation

## 2012-12-30 DIAGNOSIS — Z79899 Other long term (current) drug therapy: Secondary | ICD-10-CM | POA: Insufficient documentation

## 2012-12-30 DIAGNOSIS — M25569 Pain in unspecified knee: Secondary | ICD-10-CM | POA: Insufficient documentation

## 2012-12-30 NOTE — ED Notes (Signed)
Pt in c/o right knee pain, states pain started this am and has gotten progressively worse, states it has been giving out all day

## 2012-12-31 ENCOUNTER — Emergency Department (HOSPITAL_COMMUNITY)
Admission: EM | Admit: 2012-12-31 | Discharge: 2012-12-31 | Discharge status: Against Medical Advice | Payer: Medicaid Other | Attending: Emergency Medicine | Admitting: Emergency Medicine

## 2012-12-31 NOTE — ED Notes (Signed)
Unable to locate patient for room x3 

## 2013-01-25 ENCOUNTER — Emergency Department (HOSPITAL_COMMUNITY)
Admission: EM | Admit: 2013-01-25 | Discharge: 2013-01-25 | Disposition: A | Discharge status: Home / Self Care | Payer: Medicaid Other | Attending: Emergency Medicine | Admitting: Emergency Medicine

## 2013-01-25 ENCOUNTER — Emergency Department (HOSPITAL_COMMUNITY): Payer: Medicaid Other

## 2013-01-25 ENCOUNTER — Encounter (HOSPITAL_COMMUNITY): Payer: Self-pay

## 2013-01-25 DIAGNOSIS — Z79899 Other long term (current) drug therapy: Secondary | ICD-10-CM | POA: Insufficient documentation

## 2013-01-25 DIAGNOSIS — E669 Obesity, unspecified: Secondary | ICD-10-CM | POA: Insufficient documentation

## 2013-01-25 DIAGNOSIS — M25559 Pain in unspecified hip: Secondary | ICD-10-CM | POA: Insufficient documentation

## 2013-01-25 DIAGNOSIS — J45909 Unspecified asthma, uncomplicated: Secondary | ICD-10-CM | POA: Insufficient documentation

## 2013-01-25 DIAGNOSIS — M25552 Pain in left hip: Secondary | ICD-10-CM

## 2013-01-25 DIAGNOSIS — F319 Bipolar disorder, unspecified: Secondary | ICD-10-CM | POA: Insufficient documentation

## 2013-01-25 DIAGNOSIS — I10 Essential (primary) hypertension: Secondary | ICD-10-CM | POA: Insufficient documentation

## 2013-01-25 DIAGNOSIS — Z8659 Personal history of other mental and behavioral disorders: Secondary | ICD-10-CM | POA: Insufficient documentation

## 2013-01-25 DIAGNOSIS — F17200 Nicotine dependence, unspecified, uncomplicated: Secondary | ICD-10-CM | POA: Insufficient documentation

## 2013-01-25 LAB — BASIC METABOLIC PANEL
BUN: 18 mg/dL (ref 6–23)
Calcium: 8.9 mg/dL (ref 8.4–10.5)
Creatinine, Ser: 0.95 mg/dL (ref 0.50–1.10)
GFR calc Af Amer: 87 mL/min — ABNORMAL LOW (ref >90)
GFR calc non Af Amer: 75 mL/min — ABNORMAL LOW (ref >90)
Glucose, Bld: 90 mg/dL (ref 70–99)
Potassium: 3.6 mEq/L (ref 3.5–5.1)

## 2013-01-25 LAB — CBC
HCT: 37.5 % (ref 36.0–46.0)
Hemoglobin: 13 g/dL (ref 12.0–15.0)
MCH: 32 pg (ref 26.0–34.0)
MCHC: 34.7 g/dL (ref 30.0–36.0)
RDW: 14.1 % (ref 11.5–15.5)

## 2013-01-25 MED ORDER — KETOROLAC TROMETHAMINE 60 MG/2ML IM SOLN
60.0000 mg | Freq: Once | INTRAMUSCULAR | Status: AC
  Administered 2013-01-25: 60 mg via INTRAMUSCULAR
  Filled 2013-01-25: qty 2

## 2013-01-25 MED ORDER — TRAMADOL HCL 50 MG PO TABS: 50.0000 mg | ORAL_TABLET | Freq: Four times a day (QID) | ORAL | Status: DC | PRN

## 2013-01-25 MED ORDER — HYDROMORPHONE HCL PF 1 MG/ML IJ SOLN: 1.0000 mg | Freq: Once | INTRAMUSCULAR | Status: DC

## 2013-01-25 MED ORDER — HYDROMORPHONE HCL PF 1 MG/ML IJ SOLN
1.0000 mg | Freq: Once | INTRAMUSCULAR | Status: AC
  Administered 2013-01-25: 1 mg via INTRAMUSCULAR
  Filled 2013-01-25: qty 1

## 2013-01-25 MED ORDER — CYCLOBENZAPRINE HCL 10 MG PO TABS: 10.0000 mg | ORAL_TABLET | Freq: Two times a day (BID) | ORAL | Status: DC | PRN

## 2013-01-25 NOTE — ED Notes (Addendum)
Pt presents with 1 month h/o R knee pain.  Pt reports multiple falls due to pain, reports her knee "gives out".  Pt denies any further injury from falls.  Pt reports onset of L groin pain that woke her from sleep yesterday.  Pt reports "knot" in groin area.  Pt reports onset of mid-back pain that began yesterday that radiated into her chest (chest pain lasted x 1 hour after she laid down).   Pt reports weight loss of approximately 50 pounds x 4-5 months - has not been dieting or exercising.  Pt denies any long distance travelling but reports x 3 months she "was literally in the bed, depressed".

## 2013-01-25 NOTE — ED Provider Notes (Signed)
History    CSN: [id number] Arrival date & time 01/25/13  1421  First MD Initiated Contact with Patient 01/25/13 1538     No chief complaint on file.  (Consider location/radiation/quality/duration/timing/severity/associated sxs/prior Treatment) HPI Comments: Patient is a 37 year old female with a past medical history of asthma, bipolar disorder, and obesity who presents with left hip pain since last night. Patient reports gradual onset of left hip pain and constant symptoms since the onset. Patient reports occasional "giving out" of her left leg. She did not try anything for pain. She denies any traumatic falls. Her hip pain does not change based on weight bearing, sitting, standing, laying-it is constant. No associated symptoms.   Past Medical History  Diagnosis Date  . Asthma   . Hypertension   . Obesity   . Bipolar 1 disorder   . Depressed   . Panic attacks    Past Surgical History  Procedure Laterality Date  . Tubal ligation     History reviewed. No pertinent family history. History  Substance Use Topics  . Smoking status: Current Every Day Smoker    Types: Cigarettes    Last Attempt to Quit: 07/03/2009  . Smokeless tobacco: Not on file  . Alcohol Use: Yes     Comment: sociallly   OB History   Grav Para Term Preterm Abortions TAB SAB Ect Mult Living                 Review of Systems  Musculoskeletal: Positive for arthralgias.  All other systems reviewed and are negative.    Allergies  Other  Home Medications   Current Outpatient Rx  Name  Route  Sig  Dispense  Refill  . carvedilol (COREG) 6.25 MG tablet   Oral   Take 6.25 mg by mouth 2 (two) times daily with a meal.         . cloNIDine (CATAPRES) 0.1 MG tablet   Oral   Take 0.1 mg by mouth 2 (two) times daily.         . diazepam (VALIUM) 5 MG tablet   Oral   Take 1 tablet (5 mg total) by mouth every 6 (six) hours as needed for anxiety.   12 tablet   0   . hydrochlorothiazide (HYDRODIURIL) 25  MG tablet   Oral   Take 25 mg by mouth daily.          BP 141/95  Pulse 78  Temp(Src) 97.7 F (36.5 C) (Oral)  Resp 18  SpO2 98%  LMP 01/23/2013 Physical Exam  Nursing note and vitals reviewed. Constitutional: She is oriented to person, place, and time. She appears well-developed and well-nourished. No distress.  HENT:  Head: Normocephalic and atraumatic.  Eyes: Conjunctivae are normal.  Neck: Normal range of motion.  Cardiovascular: Normal rate and regular rhythm.  Exam reveals no gallop and no friction rub.   No murmur heard. Pulmonary/Chest: Effort normal and breath sounds normal. She has no wheezes. She has no rales. She exhibits no tenderness.  Abdominal: Soft. She exhibits no distension. There is no tenderness. There is no rebound.  Musculoskeletal: Normal range of motion.  Left anterior hip tender to palpation. No obvious deformity. ROM limited due to pain.   Neurological: She is alert and oriented to person, place, and time. Coordination normal.  Lower extremity sensation equal and intact bilaterally. Speech is goal-oriented. Moves limbs without ataxia.   Skin: Skin is warm and dry.  Psychiatric: She has a normal mood  and affect. Her behavior is normal.    ED Course  Procedures (including critical care time) Labs Reviewed  BASIC METABOLIC PANEL - Abnormal; Notable for the following:    GFR calc non Af Amer 75 (*)    GFR calc Af Amer 87 (*)    All other components within normal limits  CBC  D-DIMER, QUANTITATIVE  POCT I-STAT TROPONIN I   Dg Chest 2 View  01/25/2013   *RADIOLOGY REPORT*  Clinical Data: Chest pain, shortness of breath  CHEST - 2 VIEW  Comparison: Chest x-ray of 07/04/2011  Findings: No active infiltrate or effusion is seen. Borderline cardiomegaly is stable.  No bony abnormality is seen.  IMPRESSION: Stable borderline cardiomegaly.  No active lung disease.   Original Report Authenticated By: Dwyane Dee, M.D.   Dg Hip Complete Left  01/25/2013    *RADIOLOGY REPORT*  Clinical Data: Recent onset of left hip pain, no injury  LEFT HIP - COMPLETE 2+ VIEW  Comparison: None.  Findings: Both hip joint spaces appear normal.  No significant degenerative change is seen.  The pelvic rami are intact.  The SI joints are unremarkable.  IMPRESSION: Negative.   Original Report Authenticated By: Dwyane Dee, M.D.   1. Left hip pain     MDM  5:19 PM Xray and labs unremarkable. Patient will have IM toradol and IM dilaudid for pain. Patient instructed to follow up with Orthopedics for further evaluation. Labs unremarkable. Patient did not endorse chest pain to me.   Emilia Beck, PA-C 01/25/13 1906

## 2013-01-28 NOTE — ED Provider Notes (Signed)
Medical screening examination/treatment/procedure(s) were performed by non-physician practitioner and as supervising physician I was immediately available for consultation/collaboration.   Shelda Jakes, MD 01/28/13 [phone]

## 2013-05-16 ENCOUNTER — Encounter (HOSPITAL_COMMUNITY): Payer: Self-pay | Admitting: Emergency Medicine

## 2013-05-16 ENCOUNTER — Emergency Department (HOSPITAL_COMMUNITY)
Admission: EM | Admit: 2013-05-16 | Discharge: 2013-05-16 | Disposition: A | Discharge status: Home / Self Care | Payer: Medicaid Other | Attending: Emergency Medicine | Admitting: Emergency Medicine

## 2013-05-16 ENCOUNTER — Emergency Department (HOSPITAL_COMMUNITY)
Admission: EM | Admit: 2013-05-16 | Discharge: 2013-05-17 | Disposition: A | Discharge status: Home / Self Care | Payer: Medicaid Other | Source: Home / Self Care | Attending: Emergency Medicine | Admitting: Emergency Medicine

## 2013-05-16 DIAGNOSIS — J159 Unspecified bacterial pneumonia: Secondary | ICD-10-CM | POA: Insufficient documentation

## 2013-05-16 DIAGNOSIS — J45901 Unspecified asthma with (acute) exacerbation: Secondary | ICD-10-CM | POA: Insufficient documentation

## 2013-05-16 DIAGNOSIS — F319 Bipolar disorder, unspecified: Secondary | ICD-10-CM | POA: Insufficient documentation

## 2013-05-16 DIAGNOSIS — I10 Essential (primary) hypertension: Secondary | ICD-10-CM | POA: Insufficient documentation

## 2013-05-16 DIAGNOSIS — F17200 Nicotine dependence, unspecified, uncomplicated: Secondary | ICD-10-CM | POA: Insufficient documentation

## 2013-05-16 DIAGNOSIS — F410 Panic disorder [episodic paroxysmal anxiety] without agoraphobia: Secondary | ICD-10-CM | POA: Insufficient documentation

## 2013-05-16 DIAGNOSIS — F3130 Bipolar disorder, current episode depressed, mild or moderate severity, unspecified: Secondary | ICD-10-CM | POA: Insufficient documentation

## 2013-05-16 DIAGNOSIS — J189 Pneumonia, unspecified organism: Secondary | ICD-10-CM

## 2013-05-16 DIAGNOSIS — Z79899 Other long term (current) drug therapy: Secondary | ICD-10-CM | POA: Insufficient documentation

## 2013-05-16 DIAGNOSIS — J45909 Unspecified asthma, uncomplicated: Secondary | ICD-10-CM

## 2013-05-16 DIAGNOSIS — E669 Obesity, unspecified: Secondary | ICD-10-CM | POA: Insufficient documentation

## 2013-05-16 DIAGNOSIS — R0981 Nasal congestion: Secondary | ICD-10-CM

## 2013-05-16 DIAGNOSIS — H9209 Otalgia, unspecified ear: Secondary | ICD-10-CM | POA: Insufficient documentation

## 2013-05-16 MED ORDER — LORATADINE 10 MG PO TABS: 10.0000 mg | ORAL_TABLET | ORAL | Status: DC

## 2013-05-16 MED ORDER — ALBUTEROL SULFATE HFA 108 (90 BASE) MCG/ACT IN AERS: 2.0000 | INHALATION_SPRAY | Freq: Four times a day (QID) | RESPIRATORY_TRACT | Status: DC | PRN

## 2013-05-16 MED ORDER — ALBUTEROL SULFATE (5 MG/ML) 0.5% IN NEBU
5.0000 mg | INHALATION_SOLUTION | Freq: Once | RESPIRATORY_TRACT | Status: AC
  Administered 2013-05-16: 5 mg via RESPIRATORY_TRACT
  Filled 2013-05-16: qty 1

## 2013-05-16 MED ORDER — ALBUTEROL SULFATE HFA 108 (90 BASE) MCG/ACT IN AERS
2.0000 | INHALATION_SPRAY | Freq: Once | RESPIRATORY_TRACT | Status: AC
  Administered 2013-05-16: 2 via RESPIRATORY_TRACT
  Filled 2013-05-16: qty 6.7

## 2013-05-16 MED ORDER — LORATADINE 10 MG PO TABS
10.0000 mg | ORAL_TABLET | ORAL | Status: AC
  Administered 2013-05-16: 10 mg via ORAL
  Filled 2013-05-16: qty 1

## 2013-05-16 NOTE — ED Provider Notes (Signed)
Medical screening examination/treatment/procedure(s) were performed by non-physician practitioner and as supervising physician I was immediately available for consultation/collaboration.   Tonae Livolsi, MD 05/16/13 0703 

## 2013-05-16 NOTE — ED Notes (Signed)
Pt. reports SOB , dry cough and wheezing onset this evening .

## 2013-05-16 NOTE — ED Notes (Signed)
Pt seen this last night and d/c'd early this morning with dx of sinus infection.  Pt states she is still having trouble breathing and feels like she needs to use the inhaler every hour.  Pt states the only thing different today is her nose is now runny.  Cough is present with mucous.

## 2013-05-16 NOTE — ED Notes (Signed)
The pt has asthma and she woke up at 0100am with diff breathing.  She would like some med to stop her cough.  Alert no distress

## 2013-05-16 NOTE — ED Provider Notes (Signed)
CSN: [id number]     Arrival date & time 05/16/13  0155 History   First MD Initiated Contact with Patient 05/16/13 [phone]     Chief Complaint  Patient presents with  . Asthma   (Consider location/radiation/quality/duration/timing/severity/associated sxs/prior Treatment) HPI Comments: Patient has a history of, asthma.  Noticed yesterday.  She had some nasal congestion.  She tried taking an allergy tablets without relief.  She woke at 2 AM with extreme shortness of breath.  When for her inhaler, and found that it was missing, her children have asthma is well.  She thinks one of them may have taken it.  At the time of my examination, in the emergency department.  She had received an albuterol inhaler, and is feeling much better  Patient is a 37 y.o. female presenting with asthma. The history is provided by the patient.  Asthma This is a recurrent problem. Associated symptoms include congestion. Pertinent negatives include no chills or fever. Nothing aggravates the symptoms. The treatment provided no relief.    Past Medical History  Diagnosis Date  . Asthma   . Hypertension   . Obesity   . Bipolar 1 disorder   . Depressed   . Panic attacks    Past Surgical History  Procedure Laterality Date  . Tubal ligation     No family history on file. History  Substance Use Topics  . Smoking status: Current Every Day Smoker    Types: Cigarettes    Last Attempt to Quit: 07/03/2009  . Smokeless tobacco: Not on file  . Alcohol Use: Yes     Comment: sociallly   OB History   Grav Para Term Preterm Abortions TAB SAB Ect Mult Living                 Review of Systems  Constitutional: Negative for fever and chills.  HENT: Positive for congestion and ear pain.   Respiratory: Positive for shortness of breath and wheezing.   Neurological: Negative for dizziness.  All other systems reviewed and are negative.    Allergies  Other  Home Medications   Current Outpatient Rx  Name  Route  Sig   Dispense  Refill  . albuterol (PROVENTIL HFA;VENTOLIN HFA) 108 (90 BASE) MCG/ACT inhaler   Inhalation   Inhale 2 puffs into the lungs every 6 (six) hours as needed for wheezing.   1 Inhaler   2   . carvedilol (COREG) 6.25 MG tablet   Oral   Take 6.25 mg by mouth 2 (two) times daily with a meal.         . cloNIDine (CATAPRES) 0.1 MG tablet   Oral   Take 0.1 mg by mouth 2 (two) times daily.         . cyclobenzaprine (FLEXERIL) 10 MG tablet   Oral   Take 1 tablet (10 mg total) by mouth 2 (two) times daily as needed for muscle spasms.   20 tablet   0   . diazepam (VALIUM) 5 MG tablet   Oral   Take 1 tablet (5 mg total) by mouth every 6 (six) hours as needed for anxiety.   12 tablet   0   . hydrochlorothiazide (HYDRODIURIL) 25 MG tablet   Oral   Take 25 mg by mouth daily.         Marland Kitchen loratadine (CLARITIN) 10 MG tablet   Oral   Take 1 tablet (10 mg total) by mouth to the Emergency Department.   30 tablet  0   . traMADol (ULTRAM) 50 MG tablet   Oral   Take 1 tablet (50 mg total) by mouth every 6 (six) hours as needed for pain.   15 tablet   0    BP 182/106  Pulse 75  Temp(Src) 98.3 F (36.8 C) (Oral)  Resp 24  SpO2 100%  LMP 05/13/2013 Physical Exam  Nursing note and vitals reviewed. Constitutional: She appears well-developed and well-nourished.  HENT:  Right ear bulging.  No sign of infection.  Left ear normal  Eyes: Pupils are equal, round, and reactive to light.  Cardiovascular: Normal rate and regular rhythm.   Pulmonary/Chest: Effort normal and breath sounds normal. No respiratory distress. She has no wheezes.  Patient examined after albuterol treatment, no wheezing, rales or rhonchi noted  Musculoskeletal: Normal range of motion.  Neurological: She is alert.  Skin: Skin is warm. No rash noted.    ED Course  Procedures (including critical care time) Labs Review Labs Reviewed - No data to display Imaging Review No results found.  EKG  Interpretation   None       MDM   1. Asthma   2. Nasal congestion     Patient has been supplied with an inhaler plus prescriptions for same, states she's been also, given a prescription for Claritin, and advised to take this on a regular basis and to follow up with her primary care    Arman Filter, NP 05/16/13 0354

## 2013-05-17 ENCOUNTER — Emergency Department (HOSPITAL_COMMUNITY): Payer: Medicaid Other

## 2013-05-17 MED ORDER — IPRATROPIUM BROMIDE 0.02 % IN SOLN
0.5000 mg | Freq: Once | RESPIRATORY_TRACT | Status: AC
  Administered 2013-05-17: 0.5 mg via RESPIRATORY_TRACT
  Filled 2013-05-17: qty 2.5

## 2013-05-17 MED ORDER — ALBUTEROL SULFATE (5 MG/ML) 0.5% IN NEBU: 2.5000 mg | INHALATION_SOLUTION | Freq: Once | RESPIRATORY_TRACT | Status: DC

## 2013-05-17 MED ORDER — ALBUTEROL (5 MG/ML) CONTINUOUS INHALATION SOLN
10.0000 mg/h | INHALATION_SOLUTION | RESPIRATORY_TRACT | Status: DC
  Administered 2013-05-17: 10 mg/h via RESPIRATORY_TRACT

## 2013-05-17 MED ORDER — ALBUTEROL (5 MG/ML) CONTINUOUS INHALATION SOLN
INHALATION_SOLUTION | RESPIRATORY_TRACT | Status: AC
  Filled 2013-05-17: qty 20

## 2013-05-17 MED ORDER — AZITHROMYCIN 250 MG PO TABS: 250.0000 mg | ORAL_TABLET | Freq: Every day | ORAL | Status: DC

## 2013-05-17 MED ORDER — ALBUTEROL SULFATE (5 MG/ML) 0.5% IN NEBU
2.5000 mg | INHALATION_SOLUTION | Freq: Once | RESPIRATORY_TRACT | Status: AC
  Administered 2013-05-17: 2.5 mg via RESPIRATORY_TRACT
  Filled 2013-05-17: qty 0.5

## 2013-05-17 MED ORDER — PREDNISONE 20 MG PO TABS
60.0000 mg | ORAL_TABLET | Freq: Once | ORAL | Status: AC
  Administered 2013-05-17: 60 mg via ORAL
  Filled 2013-05-17: qty 3

## 2013-05-17 MED ORDER — PREDNISONE 20 MG PO TABS: 60.0000 mg | ORAL_TABLET | Freq: Every day | ORAL | Status: DC

## 2013-05-17 MED ORDER — AZITHROMYCIN 250 MG PO TABS
500.0000 mg | ORAL_TABLET | Freq: Once | ORAL | Status: AC
  Administered 2013-05-17: 500 mg via ORAL
  Filled 2013-05-17: qty 2

## 2013-05-17 MED ORDER — HYDROCOD POLST-CHLORPHEN POLST 10-8 MG/5ML PO LQCR
5.0000 mL | Freq: Once | ORAL | Status: AC
  Administered 2013-05-17: 5 mL via ORAL
  Filled 2013-05-17: qty 5

## 2013-05-17 MED ORDER — IPRATROPIUM BROMIDE 0.02 % IN SOLN: 0.5000 mg | Freq: Once | RESPIRATORY_TRACT | Status: DC

## 2013-05-17 NOTE — ED Provider Notes (Signed)
CSN: [id number]     Arrival date & time 05/16/13  2311 History   First MD Initiated Contact with Patient 05/16/13 2351     Chief Complaint  Patient presents with  . Asthma   (Consider location/radiation/quality/duration/timing/severity/associated sxs/prior Treatment) HPI 37 year old female presents to emergency room and with complaint of persistent wheezing and chest pain with coughing.  Patient was seen last night for upper respiratory complaints.  She's been using her inhaler every hour since being seen.  She reports prior admission, last year, for similar presentation.  She has not checked her fever, but has had chills, and has felt warm.  No known sick contacts.  She reports since taking the Claritin.  Her congestion is turned to runny nose.  She is concerned that her Raynaud's has caused her worsening asthma.  Past Medical History  Diagnosis Date  . Asthma   . Hypertension   . Obesity   . Bipolar 1 disorder   . Depressed   . Panic attacks    Past Surgical History  Procedure Laterality Date  . Tubal ligation     No family history on file. History  Substance Use Topics  . Smoking status: Current Every Day Smoker    Types: Cigarettes    Last Attempt to Quit: 07/03/2009  . Smokeless tobacco: Not on file  . Alcohol Use: Yes     Comment: sociallly   OB History   Grav Para Term Preterm Abortions TAB SAB Ect Mult Living                 Review of Systems  See History of Present Illness; otherwise all other systems are reviewed and negative Allergies  Other  Home Medications   Current Outpatient Rx  Name  Route  Sig  Dispense  Refill  . albuterol (PROVENTIL HFA;VENTOLIN HFA) 108 (90 BASE) MCG/ACT inhaler   Inhalation   Inhale 2 puffs into the lungs every 6 (six) hours as needed for wheezing.   1 Inhaler   2   . carvedilol (COREG) 6.25 MG tablet   Oral   Take 6.25 mg by mouth 2 (two) times daily with a meal.         . cloNIDine (CATAPRES) 0.1 MG tablet   Oral   Take 0.1 mg by mouth 2 (two) times daily.         . diazepam (VALIUM) 5 MG tablet   Oral   Take 1 tablet (5 mg total) by mouth every 6 (six) hours as needed for anxiety.   12 tablet   0   . loratadine (CLARITIN) 10 MG tablet   Oral   Take 1 tablet (10 mg total) by mouth to the Emergency Department.   30 tablet   0   . azithromycin (ZITHROMAX) 250 MG tablet   Oral   Take 1 tablet (250 mg total) by mouth daily.   4 tablet   0   . predniSONE (DELTASONE) 20 MG tablet   Oral   Take 3 tablets (60 mg total) by mouth daily.   15 tablet   0    BP 168/93  Pulse 107  Temp(Src) 97.9 F (36.6 C) (Oral)  Resp 20  SpO2 100%  LMP 05/13/2013 Physical Exam  Nursing note and vitals reviewed. Constitutional: She is oriented to person, place, and time. She appears well-developed and well-nourished. She appears distressed.  Morbidly obese, female, audible wheezing from the door  HENT:  Head: Normocephalic and atraumatic.  Right Ear: External  ear normal.  Left Ear: External ear normal.  Nose: Nose normal.  Mouth/Throat: Oropharynx is clear and moist.  Bulging of right TM without effusion or erythema  Eyes: Conjunctivae and EOM are normal. Pupils are equal, round, and reactive to light.  Neck: Normal range of motion. Neck supple. No JVD present. No tracheal deviation present. No thyromegaly present.  Cardiovascular: Normal rate, regular rhythm, normal heart sounds and intact distal pulses.  Exam reveals no gallop and no friction rub.   No murmur heard. Pulmonary/Chest: Effort normal. No stridor. No respiratory distress. She has wheezes. She has no rales. She exhibits no tenderness.  Abdominal: Soft. Bowel sounds are normal. She exhibits no distension and no mass. There is no tenderness. There is no rebound and no guarding.  Musculoskeletal: Normal range of motion. She exhibits no edema and no tenderness.  Lymphadenopathy:    She has no cervical adenopathy.  Neurological: She is alert  and oriented to person, place, and time. She has normal reflexes. She exhibits normal muscle tone. Coordination normal.  Skin: Skin is warm and dry. No rash noted. No erythema. No pallor.  Psychiatric: She has a normal mood and affect. Her behavior is normal. Judgment and thought content normal.    ED Course  Procedures (including critical care time) Labs Review Labs Reviewed - No data to display Imaging Review Dg Chest Port 1 View  05/17/2013   CLINICAL DATA:  Shortness of breath, cough, wheezing.  EXAM: PORTABLE CHEST - 1 VIEW  COMPARISON:  01/25/2013  FINDINGS: Cardiopericardial enlargement. Diffuse interstitial prominence with atelectasis in the peripheral right upper lung. No effusion or pneumothorax. No acute osseous findings.  IMPRESSION: 1. Atelectasis and interstitial changes which favor asthma or airway infection. 2. Stable mild cardiomegaly.   Electronically Signed   By: Tiburcio Pea M.D.   On: 05/17/2013 02:51      MDM   1. CAP (community acquired pneumonia)   2. Asthma exacerbation    Patient with no improvement after 2 neb treatments here.  She has received steroids.  We'll check chest x-ray, and give hour long neb  Patient much improved after a long neb.  Chest x-ray with possible infection, and right upper lung.  We'll place on Zithromax.  Patient is comfortable with going home.    Olivia Mackie, MD 05/17/13 [phone]

## 2013-05-17 NOTE — Progress Notes (Signed)
Started patient continuous nebulizer tx to deliver 10mg /hr

## 2013-10-11 ENCOUNTER — Emergency Department (HOSPITAL_COMMUNITY)
Admission: EM | Admit: 2013-10-11 | Discharge: 2013-10-11 | Disposition: A | Discharge status: Home / Self Care | Payer: Medicaid Other | Attending: Emergency Medicine | Admitting: Emergency Medicine

## 2013-10-11 ENCOUNTER — Encounter (HOSPITAL_COMMUNITY): Payer: Self-pay | Admitting: Emergency Medicine

## 2013-10-11 DIAGNOSIS — R51 Headache: Secondary | ICD-10-CM

## 2013-10-11 DIAGNOSIS — Z3202 Encounter for pregnancy test, result negative: Secondary | ICD-10-CM | POA: Insufficient documentation

## 2013-10-11 DIAGNOSIS — F410 Panic disorder [episodic paroxysmal anxiety] without agoraphobia: Secondary | ICD-10-CM | POA: Insufficient documentation

## 2013-10-11 DIAGNOSIS — R030 Elevated blood-pressure reading, without diagnosis of hypertension: Secondary | ICD-10-CM

## 2013-10-11 DIAGNOSIS — E669 Obesity, unspecified: Secondary | ICD-10-CM | POA: Insufficient documentation

## 2013-10-11 DIAGNOSIS — Z9119 Patient's noncompliance with other medical treatment and regimen: Secondary | ICD-10-CM

## 2013-10-11 DIAGNOSIS — F17200 Nicotine dependence, unspecified, uncomplicated: Secondary | ICD-10-CM | POA: Insufficient documentation

## 2013-10-11 DIAGNOSIS — J45909 Unspecified asthma, uncomplicated: Secondary | ICD-10-CM | POA: Insufficient documentation

## 2013-10-11 DIAGNOSIS — R110 Nausea: Secondary | ICD-10-CM | POA: Insufficient documentation

## 2013-10-11 DIAGNOSIS — Z91199 Patient's noncompliance with other medical treatment and regimen due to unspecified reason: Secondary | ICD-10-CM

## 2013-10-11 DIAGNOSIS — I10 Essential (primary) hypertension: Secondary | ICD-10-CM | POA: Insufficient documentation

## 2013-10-11 DIAGNOSIS — IMO0001 Reserved for inherently not codable concepts without codable children: Secondary | ICD-10-CM

## 2013-10-11 DIAGNOSIS — R109 Unspecified abdominal pain: Secondary | ICD-10-CM | POA: Insufficient documentation

## 2013-10-11 DIAGNOSIS — R42 Dizziness and giddiness: Secondary | ICD-10-CM | POA: Insufficient documentation

## 2013-10-11 DIAGNOSIS — F319 Bipolar disorder, unspecified: Secondary | ICD-10-CM | POA: Insufficient documentation

## 2013-10-11 DIAGNOSIS — R519 Headache, unspecified: Secondary | ICD-10-CM

## 2013-10-11 DIAGNOSIS — R5383 Other fatigue: Secondary | ICD-10-CM

## 2013-10-11 DIAGNOSIS — R5381 Other malaise: Secondary | ICD-10-CM | POA: Insufficient documentation

## 2013-10-11 DIAGNOSIS — Z79899 Other long term (current) drug therapy: Secondary | ICD-10-CM | POA: Insufficient documentation

## 2013-10-11 LAB — HEPATIC FUNCTION PANEL
ALK PHOS: 74 U/L (ref 39–117)
ALT: 14 U/L (ref 0–35)
AST: 16 U/L (ref 0–37)
Albumin: 3.6 g/dL (ref 3.5–5.2)
BILIRUBIN TOTAL: 0.4 mg/dL (ref 0.3–1.2)
Bilirubin, Direct: <0.2 mg/dL (ref 0.0–0.3)
Total Protein: 6.9 g/dL (ref 6.0–8.3)

## 2013-10-11 LAB — URINALYSIS, ROUTINE W REFLEX MICROSCOPIC
BILIRUBIN URINE: NEGATIVE
Glucose, UA: NEGATIVE mg/dL
Hgb urine dipstick: NEGATIVE
KETONES UR: NEGATIVE mg/dL
Leukocytes, UA: NEGATIVE
NITRITE: NEGATIVE
Protein, ur: NEGATIVE mg/dL
Specific Gravity, Urine: 1.019 (ref 1.005–1.030)
UROBILINOGEN UA: 0.2 mg/dL (ref 0.0–1.0)
pH: 6 (ref 5.0–8.0)

## 2013-10-11 LAB — CBC
HCT: 37.8 % (ref 36.0–46.0)
HEMOGLOBIN: 13.6 g/dL (ref 12.0–15.0)
MCH: 33.3 pg (ref 26.0–34.0)
MCHC: 36 g/dL (ref 30.0–36.0)
MCV: 92.4 fL (ref 78.0–100.0)
Platelets: 239 10*3/uL (ref 150–400)
RBC: 4.09 MIL/uL (ref 3.87–5.11)
RDW: 14.3 % (ref 11.5–15.5)
WBC: 6.9 10*3/uL (ref 4.0–10.5)

## 2013-10-11 LAB — BASIC METABOLIC PANEL
BUN: 14 mg/dL (ref 6–23)
CO2: 22 mEq/L (ref 19–32)
Calcium: 9.1 mg/dL (ref 8.4–10.5)
Chloride: 104 mEq/L (ref 96–112)
Creatinine, Ser: 0.71 mg/dL (ref 0.50–1.10)
Glucose, Bld: 95 mg/dL (ref 70–99)
POTASSIUM: 5.3 meq/L (ref 3.7–5.3)
Sodium: 139 mEq/L (ref 137–147)

## 2013-10-11 LAB — I-STAT TROPONIN, ED: Troponin i, poc: 0 ng/mL (ref 0.00–0.08)

## 2013-10-11 LAB — LIPASE, BLOOD: LIPASE: 32 U/L (ref 11–59)

## 2013-10-11 LAB — PREGNANCY, URINE: Preg Test, Ur: NEGATIVE

## 2013-10-11 MED ORDER — CARVEDILOL 6.25 MG PO TABS: 6.2500 mg | ORAL_TABLET | Freq: Two times a day (BID) | ORAL | Status: DC

## 2013-10-11 MED ORDER — SODIUM CHLORIDE 0.9 % IV BOLUS (SEPSIS)
1000.0000 mL | Freq: Once | INTRAVENOUS | Status: AC
  Administered 2013-10-11: 1000 mL via INTRAVENOUS

## 2013-10-11 MED ORDER — CLONIDINE HCL 0.1 MG PO TABS: 0.1000 mg | ORAL_TABLET | Freq: Two times a day (BID) | ORAL | Status: DC

## 2013-10-11 MED ORDER — DIPHENHYDRAMINE HCL 50 MG/ML IJ SOLN
12.5000 mg | Freq: Once | INTRAMUSCULAR | Status: DC
  Filled 2013-10-11: qty 1

## 2013-10-11 MED ORDER — METOCLOPRAMIDE HCL 5 MG/ML IJ SOLN
10.0000 mg | Freq: Once | INTRAMUSCULAR | Status: AC
  Administered 2013-10-11: 10 mg via INTRAVENOUS

## 2013-10-11 MED ORDER — METOCLOPRAMIDE HCL 5 MG/ML IJ SOLN
10.0000 mg | Freq: Once | INTRAMUSCULAR | Status: DC
  Filled 2013-10-11: qty 2

## 2013-10-11 NOTE — ED Notes (Signed)
Spoke with lab- gave verbal changes to orders.

## 2013-10-11 NOTE — ED Notes (Signed)
Pt c/o HA x 4 days with some dizziness and htn

## 2013-10-11 NOTE — ED Provider Notes (Signed)
CSN: [id number]     Arrival date & time 10/11/13  0754 History   First MD Initiated Contact with Patient 10/11/13 0809     Chief Complaint  Patient presents with  . Headache  . Dizziness     (Consider location/radiation/quality/duration/timing/severity/associated sxs/prior Treatment) The history is provided by the patient. No language interpreter was used.  Paula Fitzgerald is a 38 year old female with past medical history of asthma, hypertension, obesity, depression, panic attacks presenting to the ED with headache, nausea, dizziness, fatigue. Patient reports that she's been having a headache for the past 4 days intermittently described as a bearable but discomforting headache is intermittent. Reports feeling intermittent sensation of "fuzziness"-reported that she's taking nothing for the pain. Stated she has intermittent episodes of dizziness. Reported that she's been waking up fatigued. Reported this morning that she noticed increase in nausea with negative episodes of emesis. Patient reports that she does not drink a lot of water-reported that she drank nothing yesterday. Stated that she does take medications for high blood pressure control-reported that she's not been taking them properly, reported that she only took one tablet each yesterday when normally she needs to take at least 2 per day. Stated that she's actually stressed out, reported that her 80 year old son just moved out and silicone to college and reported that she has an autistic son. Reported that she's been having issues with her health insurance has also been leaning to her discomfort and stress. Denied worst headache of life, fever, chills, blurred vision, sudden loss of vision, chest pain, shortness of breath, difficulty breathing, diarrhea, vomiting, melena, hematochezia, numbness, tingling, weakness, confusion, neck pain, neck stiffness, floaters, flashes. PCP Dr. Concepcion Elk  Past Medical History  Diagnosis Date  . Asthma    . Hypertension   . Obesity   . Bipolar 1 disorder   . Depressed   . Panic attacks    Past Surgical History  Procedure Laterality Date  . Tubal ligation     History reviewed. No pertinent family history. History  Substance Use Topics  . Smoking status: Current Every Day Smoker    Types: Cigarettes    Last Attempt to Quit: 07/03/2009  . Smokeless tobacco: Not on file  . Alcohol Use: Yes     Comment: sociallly   OB History   Grav Para Term Preterm Abortions TAB SAB Ect Mult Living                 Review of Systems  Constitutional: Negative for fever and chills.  Eyes: Negative for visual disturbance.  Respiratory: Negative for chest tightness and shortness of breath.   Cardiovascular: Negative for chest pain.  Gastrointestinal: Positive for nausea and abdominal pain. Negative for vomiting, diarrhea, constipation and blood in stool.  Genitourinary: Negative for dysuria.  Musculoskeletal: Negative for back pain and neck pain.  Neurological: Positive for dizziness and headaches. Negative for syncope, weakness and numbness.  All other systems reviewed and are negative.      Allergies  Other  Home Medications   Current Outpatient Rx  Name  Route  Sig  Dispense  Refill  . albuterol (PROVENTIL HFA;VENTOLIN HFA) 108 (90 BASE) MCG/ACT inhaler   Inhalation   Inhale 2 puffs into the lungs every 6 (six) hours as needed for wheezing.   1 Inhaler   2   . diazepam (VALIUM) 5 MG tablet   Oral   Take 1 tablet (5 mg total) by mouth every 6 (six) hours as needed for anxiety.  12 tablet   0   . carvedilol (COREG) 6.25 MG tablet   Oral   Take 1 tablet (6.25 mg total) by mouth 2 (two) times daily with a meal.   60 tablet   0   . cloNIDine (CATAPRES) 0.1 MG tablet   Oral   Take 1 tablet (0.1 mg total) by mouth 2 (two) times daily.   60 tablet   0    BP 144/99  Pulse 115  Temp(Src) 99 F (37.2 C) (Oral)  Resp 25  Ht 5\' 3"  (1.6 m)  Wt 285 lb (129.275 kg)  BMI  50.50 kg/m2  SpO2 97% Physical Exam  Nursing note and vitals reviewed. Constitutional: She is oriented to person, place, and time. She appears well-developed and well-nourished. No distress.  HENT:  Head: Normocephalic and atraumatic.  Mouth/Throat: Oropharynx is clear and moist. No oropharyngeal exudate.  Eyes: Conjunctivae and EOM are normal. Pupils are equal, round, and reactive to light. Right eye exhibits no discharge. Left eye exhibits no discharge.  Negative nystagmus Visual fields grossly intact  Neck: Normal range of motion. Neck supple. No tracheal deviation present.  Negative neck stiffness Negative nuchal rigidity Neck cervical lymphadenopathy Negative meningeal signs  Cardiovascular: Normal rate, regular rhythm and normal heart sounds.  Exam reveals no friction rub.   No murmur heard. Cap refill less than 3 seconds Negative swelling or pitting edema identified to lower extremities bilaterally  Pulmonary/Chest: Effort normal and breath sounds normal. No respiratory distress. She has no wheezes. She has no rales.  Patient stable to speak in full sentences without difficulty Negative use of accessory muscles Negative stridor  Abdominal: Soft. Bowel sounds are normal. There is no tenderness. There is no guarding.  Obese Negative Murphy's sign Negative McBurney's point  Musculoskeletal: Normal range of motion.  Full ROM to upper and lower extremities without difficulty noted, negative ataxia noted.  Lymphadenopathy:    She has no cervical adenopathy.  Neurological: She is alert and oriented to person, place, and time. No cranial nerve deficit. She exhibits normal muscle tone. Coordination normal.  Cranial nerves III-XII grossly intact Strength 5+/5+ to upper and lower extremities bilaterally with resistance applied, equal distribution noted Sensation intact with differentiation sharp and dull touch Negative facial drooping Negative slurred speech Negative arm drift Fine  motor skills intact Gait proper, proper balance - negative sway, negative drift, negative step-offs  Skin: Skin is warm and dry. No rash noted. She is not diaphoretic. No erythema.  Psychiatric: She has a normal mood and affect. Her behavior is normal. Thought content normal.    ED Course  Procedures (including critical care time)  11:38 AM This provider reassess the patient. Patient reports that she is headache free. Stated she is feeling well. Patient reported that she is out of her medications for clonidine and carvedilol-this provider reported that prescriptions will be refilled.  Results for orders placed during the hospital encounter of 10/11/13  CBC      Result Value Ref Range   WBC 6.9  4.0 - 10.5 K/uL   RBC 4.09  3.87 - 5.11 MIL/uL   Hemoglobin 13.6  12.0 - 15.0 g/dL   HCT 47.837.8  29.536.0 - 62.146.0 %   MCV 92.4  78.0 - 100.0 fL   MCH 33.3  26.0 - 34.0 pg   MCHC 36.0  30.0 - 36.0 g/dL   RDW 30.814.3  65.711.5 - 84.615.5 %   Platelets 239  150 - 400 K/uL  BASIC METABOLIC PANEL  Result Value Ref Range   Sodium 139  137 - 147 mEq/L   Potassium 5.3  3.7 - 5.3 mEq/L   Chloride 104  96 - 112 mEq/L   CO2 22  19 - 32 mEq/L   Glucose, Bld 95  70 - 99 mg/dL   BUN 14  6 - 23 mg/dL   Creatinine, Ser 8.11  0.50 - 1.10 mg/dL   Calcium 9.1  8.4 - 91.4 mg/dL   GFR calc non Af Amer >90  >90 mL/min   GFR calc Af Amer >90  >90 mL/min  LIPASE, BLOOD      Result Value Ref Range   Lipase 32  11 - 59 U/L  URINALYSIS, ROUTINE W REFLEX MICROSCOPIC      Result Value Ref Range   Color, Urine YELLOW  YELLOW   APPearance CLEAR  CLEAR   Specific Gravity, Urine 1.019  1.005 - 1.030   pH 6.0  5.0 - 8.0   Glucose, UA NEGATIVE  NEGATIVE mg/dL   Hgb urine dipstick NEGATIVE  NEGATIVE   Bilirubin Urine NEGATIVE  NEGATIVE   Ketones, ur NEGATIVE  NEGATIVE mg/dL   Protein, ur NEGATIVE  NEGATIVE mg/dL   Urobilinogen, UA 0.2  0.0 - 1.0 mg/dL   Nitrite NEGATIVE  NEGATIVE   Leukocytes, UA NEGATIVE  NEGATIVE   PREGNANCY, URINE      Result Value Ref Range   Preg Test, Ur NEGATIVE  NEGATIVE  HEPATIC FUNCTION PANEL      Result Value Ref Range   Total Protein 6.9  6.0 - 8.3 g/dL   Albumin 3.6  3.5 - 5.2 g/dL   AST 16  0 - 37 U/L   ALT 14  0 - 35 U/L   Alkaline Phosphatase 74  39 - 117 U/L   Total Bilirubin 0.4  0.3 - 1.2 mg/dL   Bilirubin, Direct <7.8  0.0 - 0.3 mg/dL   Indirect Bilirubin NOT CALCULATED  0.3 - 0.9 mg/dL  I-STAT TROPOININ, ED      Result Value Ref Range   Troponin i, poc 0.00  0.00 - 0.08 ng/mL   Comment 3             Labs Review Labs Reviewed  CBC  BASIC METABOLIC PANEL  LIPASE, BLOOD  URINALYSIS, ROUTINE W REFLEX MICROSCOPIC  PREGNANCY, URINE  HEPATIC FUNCTION PANEL  I-STAT TROPOININ, ED   Imaging Review No results found.   EKG Interpretation   Date/Time:  Friday October 11 2013 07:58:14 EDT Ventricular Rate:  69 PR Interval:  158 QRS Duration: 82 QT Interval:  386 QTC Calculation: 413 R Axis:   56 Text Interpretation:  Normal sinus rhythm Normal ECG since last tracing no  significant change Confirmed by Effie Shy  MD, ELLIOTT ([id number]) on 10/11/2013  8:42:00 AM      MDM   Final diagnoses:  Elevated blood pressure  Headache  Medical non-compliance   Medications  sodium chloride 0.9 % bolus 1,000 mL (0 mLs Intravenous Stopped 10/11/13 1029)  metoCLOPramide (REGLAN) injection 10 mg (10 mg Intravenous Given 10/11/13 0912)   Filed Vitals:   10/11/13 1015 10/11/13 1045 10/11/13 1100 10/11/13 1115  BP: 158/107 147/92 141/84 144/99  Pulse: 70 69 69 115  Temp:      TempSrc:      Resp: 20 20 18 25   Height:      Weight:      SpO2: 100% 99% 100% 97%    Patient presenting to the ED with  headache x4 days, nausea this morning, dizziness and fatigue intermittently for the past couple of weeks. Reported that the discomfort is frontal aspect described as an intermittent bearable, but her. Reported that she feels intermittent fuzzy. Stated that she's been having  increasing stress secondary to her son moving out and her autistic child. Reported that she's not been taking her medications properly reported that she has not been drinking a lot of fluids or water. Alert and oriented. GCS 15. Heart rate and rhythm normal. Lungs clear to auscultation to upper and lower lobes bilaterally. Radial and DP pulses 2+. Cap refill less than 3 seconds. Obese-bowel sounds normoactive in all 4 quadrants, soft upon palpation-negative acute findings-benign abdominal exam. Negative nystagmus. Visual fields grossly intact. Cranial nerves grossly intact. Full range of motion to upper and lower tremors bilaterally without difficulty or ataxia. Strength intact with equal distribution to equal grip strength. Sensation intact with differentiation sharp and dull touch. Gait proper-negative sway or step-offs noted. EKG noted normal sinus rhythm with a heart rate of 69 beats per minute-normal EKG since last tracing, negative ischemic findings noted. First troponin negative elevation. CBC negative elevated white blood cell count. BMP with normal functioning kidneys. Hepatic function panel within normal limits. Lipase negative elevation. Urine pregnancy negative. Urinalysis negative for nitrites or leukocytes. Doubt acute abdominal processes. Negative findings of endorgan damage. Suspicion of dehydration, poor medical compliance and increased stress that led to episode of dizziness, headache, and episode of elevated blood pressure. Patient stable, afebrile. Discharged patient. Discharged patient with refill prescriptions for clonidine and carvedilol suspension only has 4 tablets at each left. Discussed with patient to rest and stay hydrated. Discussed with patient DASH diet. Educated patient on importance of taking medication and educated patient on consequences of poor control blood pressure. Referred patient to primary care provider. Discussed with patient importance of taking the medication, staying  hydrated and monitor and blood pressure-discussed his palate he a blood pressure. Discussed with patient to increase exercise. Discussed with patient to closely monitor symptoms and if symptoms are to worsen or change to report back to the ED - strict return instructions given.  Patient agreed to plan of care, understood, all questions answered.   Raymon Mutton, PA-C 10/11/13 1834

## 2013-10-11 NOTE — Discharge Instructions (Signed)
Please call your doctor for a followup appointment within 24-48 hours. When you talk to your doctor please let them know that you were seen in the emergency department and have them acquire all of your records so that they can discuss the findings with you and formulate a treatment plan to fully care for your new and ongoing problems. Please call and set up an appointment with your primary care provider to be reassessed Please rest and stay hydrated Please take medications as prescribed-these medications are in use to control your blood pressure Please follow DASH diet Please continue to monitor symptoms closely and if symptoms are to worsen or change (fever greater than 101, chills, nausea, vomiting, chest pain, shortness of breath, difficulty breathing, weakness, tingling, numbness, blurred vision, sudden loss of vision, worsening symptoms, dizziness, worse headache of life, sudden onset headache, fainting episode) please report back to the ED immediately  Hypertension As your heart beats, it forces blood through your arteries. This force is your blood pressure. If the pressure is too high, it is called hypertension (HTN) or high blood pressure. HTN is dangerous because you may have it and not know it. High blood pressure may mean that your heart has to work harder to pump blood. Your arteries may be narrow or stiff. The extra work puts you at risk for heart disease, stroke, and other problems.  Blood pressure consists of two numbers, a higher number over a lower, 110/72, for example. It is stated as "110 over 72." The ideal is below 120 for the top number (systolic) and under 80 for the bottom (diastolic). Write down your blood pressure today. You should pay close attention to your blood pressure if you have certain conditions such as:  Heart failure.  Prior heart attack.  Diabetes  Chronic kidney disease.  Prior stroke.  Multiple risk factors for heart disease. To see if you have HTN, your  blood pressure should be measured while you are seated with your arm held at the level of the heart. It should be measured at least twice. A one-time elevated blood pressure reading (especially in the Emergency Department) does not mean that you need treatment. There may be conditions in which the blood pressure is different between your right and left arms. It is important to see your caregiver soon for a recheck. Most people have essential hypertension which means that there is not a specific cause. This type of high blood pressure may be lowered by changing lifestyle factors such as:  Stress.  Smoking.  Lack of exercise.  Excessive weight.  Drug/tobacco/alcohol use.  Eating less salt. Most people do not have symptoms from high blood pressure until it has caused damage to the body. Effective treatment can often prevent, delay or reduce that damage. TREATMENT  When a cause has been identified, treatment for high blood pressure is directed at the cause. There are a large number of medications to treat HTN. These fall into several categories, and your caregiver will help you select the medicines that are best for you. Medications may have side effects. You should review side effects with your caregiver. If your blood pressure stays high after you have made lifestyle changes or started on medicines,   Your medication(s) may need to be changed.  Other problems may need to be addressed.  Be certain you understand your prescriptions, and know how and when to take your medicine.  Be sure to follow up with your caregiver within the time frame advised (usually within  two weeks) to have your blood pressure rechecked and to review your medications.  If you are taking more than one medicine to lower your blood pressure, make sure you know how and at what times they should be taken. Taking two medicines at the same time can result in blood pressure that is too low. SEEK IMMEDIATE MEDICAL CARE  IF:  You develop a severe headache, blurred or changing vision, or confusion.  You have unusual weakness or numbness, or a faint feeling.  You have severe chest or abdominal pain, vomiting, or breathing problems. MAKE SURE YOU:   Understand these instructions.  Will watch your condition.  Will get help right away if you are not doing well or get worse. Document Released: 07/04/2005 Document Revised: 09/26/2011 Document Reviewed: 02/22/2008 [facility] Patient Information 2014 [place], [place].  DASH Diet The DASH diet stands for "Dietary Approaches to Stop Hypertension." It is a healthy eating plan that has been shown to reduce high blood pressure (hypertension) in as little as 14 days, while also possibly providing other significant health benefits. These other health benefits include reducing the risk of breast cancer after menopause and reducing the risk of type 2 diabetes, heart disease, colon cancer, and stroke. Health benefits also include weight loss and slowing kidney failure in patients with chronic kidney disease.  DIET GUIDELINES  Limit salt (sodium). Your diet should contain less than 1500 mg of sodium daily.  Limit refined or processed carbohydrates. Your diet should include mostly whole grains. Desserts and added sugars should be used sparingly.  Include small amounts of heart-healthy fats. These types of fats include nuts, oils, and tub margarine. Limit saturated and trans fats. These fats have been shown to be harmful in the body. CHOOSING FOODS  The following food groups are based on a 2000 calorie diet. See your Registered Dietitian for individual calorie needs. Grains and Grain Products (6 to 8 servings daily)  Eat More Often: Whole-wheat bread, brown rice, whole-grain or wheat pasta, quinoa, popcorn without added fat or salt (air popped).  Eat Less Often: White bread, white pasta, white rice, cornbread. Vegetables (4 to 5 servings daily)  Eat More Often: Fresh,  frozen, and canned vegetables. Vegetables may be raw, steamed, roasted, or grilled with a minimal amount of fat.  Eat Less Often/Avoid: Creamed or fried vegetables. Vegetables in a cheese sauce. Fruit (4 to 5 servings daily)  Eat More Often: All fresh, canned (in natural juice), or frozen fruits. Dried fruits without added sugar. One hundred percent fruit juice ( cup [237 mL] daily).  Eat Less Often: Dried fruits with added sugar. Canned fruit in light or heavy syrup. [employer], Fish, and Poultry (2 servings or less daily. One serving is 3 to 4 oz [85-114 g]).  Eat More Often: Ninety percent or leaner ground beef, tenderloin, sirloin. Round cuts of beef, chicken breast, [place] breast. All fish. Grill, bake, or broil your meat. Nothing should be fried.  Eat Less Often/Avoid: Fatty cuts of meat, [place], or chicken leg, thigh, or wing. Fried cuts of meat or fish. Dairy (2 to 3 servings)  Eat More Often: Low-fat or fat-free milk, low-fat plain or light yogurt, reduced-fat or part-skim cheese.  Eat Less Often/Avoid: Milk (whole, 2%).Whole milk yogurt. Full-fat cheeses. Nuts, Seeds, and Legumes (4 to 5 servings per week)  Eat More Often: All without added salt.  Eat Less Often/Avoid: Salted nuts and seeds, canned beans with added salt. Fats and Sweets (limited)  Eat More Often: Vegetable oils,  tub margarines without trans fats, sugar-free gelatin. Mayonnaise and salad dressings.  Eat Less Often/Avoid: Coconut oils, palm oils, butter, stick margarine, cream, half and half, cookies, candy, pie. FOR MORE INFORMATION The Dash Diet Eating Plan: www.dashdiet.org Document Released: 06/23/2011 Document Revised: 09/26/2011 Document Reviewed: 06/23/2011 [facility] Patient Information 2014 [place], [place].   Emergency Department Resource Guide 1) Find a Doctor and Pay Out of Pocket Although you won't have to find out who is covered by your insurance plan, it is a good idea to ask around and get  recommendations. You will then need to call the office and see if the doctor you have chosen will accept you as a new patient and what types of options they offer for patients who are self-pay. Some doctors offer discounts or will set up payment plans for their patients who do not have insurance, but you will need to ask so you aren't surprised when you get to your appointment.  2) Contact Your Local Health Department Not all health departments have doctors that can see patients for sick visits, but many do, so it is worth a call to see if yours does. If you don't know where your local health department is, you can check in your phone book. The CDC also has a tool to help you locate your state's health department, and many state websites also have listings of all of their local health departments.  3) Find a Walk-in Clinic If your illness is not likely to be very severe or complicated, you may want to try a walk in clinic. These are popping up all over the country in pharmacies, drugstores, and shopping centers. They're usually staffed by nurse practitioners or physician assistants that have been trained to treat common illnesses and complaints. They're usually fairly quick and inexpensive. However, if you have serious medical issues or chronic medical problems, these are probably not your best option.  No Primary Care Doctor: - Call Health Connect at  [phone] - they can help you locate a primary care doctor that  accepts your insurance, provides certain services, etc. - Physician Referral Service- [phone]  Chronic Pain Problems: Organization         Address  Phone   Notes  [facility]  [phone] Patients need to be referred by their primary care doctor.   Medication Assistance: Organization         Address  Phone   Notes  [facility] Medication [facility] [address] [place] [place], [place] [address] [phone] --Must be a resident of  [facility] -- Must have NO insurance coverage whatsoever (no Medicaid/ Medicare, etc.) -- The pt. MUST have a primary care doctor that directs their care regularly and follows them in the community   [facility]ist  [phone]   [employer]  [phone]    Agencies that provide inexpensive medical care: Organization         Address  Phone   Notes  [facility]  [phone]   [facility]    [phone]   [facility] [address] [place], [place] [address] [phone]   [facility] [address] [place] [place] [phone]   Planned Parenthood    [phone]   [facility]    [phone]   [facility]  [address], [place] Phone:  [phone], Fax:  [phone] Hours  of Operation:  9 am - 6 pm, M-F.  Also accepts Medicaid/Medicare and self-pay.  [facility]  [address], [place] Phone: [phone](336) [phone], Fax: [phone](336) [phone]. Hours of Operation:  8:30 am - 5:30 pm, M-F.  Also accepts Medicaid and self-pay.  [facility] [place] [address], [place] Phone: [phone](336) [phone]   [facility] [address], [place], [place] [phone], Ext. [phone] Mondays & Thursdays: 7-9 AM.  First 15 patients are seen on a first come, first serve basis.    Medicaid-accepting [facility] Providers:  Organization         Address  Phone   Notes  [facility] [address], [place] [phone]) [phone] Also accepts self-pay patients.  [facility] [address], [place]  [phone]) [phone]   [facility] [address], [place] [phone]) [phone]   [facility] [address] [place] [address], [place] [phone]) [phone]   Renaye RakersVeita Bland [address], [place]   [phone]) [phone] Only accepts [place] Access  [place] patients after they have their name applied to their card.   Self-Pay (no insurance) in [facility]:  Organization         Address  Phone   Notes  Sickle Cell Patients, [facility] [address], [place] [phone]) [phone]   [facility] [address], [place] [phone]   [facility]  [address], [place] [phone]) [phone]   [facility]/Dr. Osei-Bonsu  [address] [place] [address], [place] or [phone], [place] [phone]) [phone] Phone number for both [place] and [place] locations is the same.  [facility] [address], [place] [phone](336) [phone]   [facility] [address] [place] [address], [place] or [address] [phone]) [phone]   [facility] [address], [place] [phone](336) [phone], phone; [phone]) [phone], fax Sees patients 1st and 3rd Saturday of every month.  Must not qualify for public or private insurance (i.e. Medicaid, Medicare, [place] Health Choice, Veterans' Benefits)  Household income should be no more than 200% of the poverty level The clinic cannot treat you if you are pregnant or think you are pregnant  Sexually transmitted diseases are not treated at the clinic.    Dental Care: Organization         Address  Phone  Notes  [facility] [address], [place] [phone]) [phone] Accepts children up to age 40 who are enrolled in [place] or [place] Health Choice; pregnant women with a Medicaid card; and children who have applied for Medicaid or [place] Health Choice, but were declined, whose parents can pay a reduced fee at time of service.  [facility] [place]  [address], [place] [phone]) [phone] Accepts children up to age 521 who are enrolled in [place] or [place] Health Choice; pregnant women with a Medicaid  card; and children who have applied for Medicaid or [place] Health Choice, but were declined, whose parents can pay a reduced fee at time of service.  [facility]  [address], [place] [phone]) [phone] Patients are seen by appointment only. Walk-ins are not accepted. [facility] will see  patients 5 years of age and older. Monday - Tuesday (8am-5pm) Most Wednesdays (8:30-5pm) $30 per visit, cash only  [facility] Adult Dental Access PRO[facility]  [address], [facility] [phone] Patients are seen by appointment only. Walk-ins are not accepted. [facility] will see patients 14 years of age and older. One Wednesday Evening (Monthly: Volunteer Based).  $30 per visit, cash only  [employer]  [phone] for adults; Children under age 37, call Graduate Pediatric Dentistry at [phone]. Children aged 69-14, please call [phone] to request a pediatric application.  Dental services are provided in all areas of dental care including fillings, crowns and bridges, complete and partial dentures, implants, gum treatment, root canals, and extractions. Preventive care is also provided. Treatment is provided to both adults and children. Patients are selected via a lottery and there is often a waiting list.   [facility] [address] [place]  [phone] [email or web address]   [facility] [address] [place], [place] [phone], Ext. [phone] Second and Fourth Thursday of each month, opens at 6:30 AM; Clinic ends at 9 AM.  Patients are seen on a first-come first-served basis, and a limited number are seen during each clinic.   [facility]  [address] [place], [place] [phone]   Eligibility Requirements You must have lived in [place], [place], or [place] counties for at least the last three months.   You cannot be eligible for state or federal sponsored [place],  including [employer], [place], or [employer].   You generally cannot be eligible for healthcare insurance through your employer.    How to apply: Eligibility screenings are held every Tuesday and Wednesday afternoon from 1:00 pm until 4:00 pm. You do not need an appointment for the interview!  [facility] [address], [place], [place] [phone]   [facility]  [phone]   [facility]  [phone]   [facility]  [phone]    Behavioral Health Resources in the Community: Intensive Outpatient Programs Organization         Address  Phone  Notes  [facility] [address], [place], [place] [phone]   [facility] Outpatient [address] [place] [address] [place], [place] [phone]   [facility] [address] [place] [address], [place], [place]  [phone]   [facility] [address],  [place], [place] [phone] or [phone]   Substance Abuse Resources Organization         Address  Phone  Notes  [facility]  [phone]   [facility]  [phone]   [facility]  [phone]   Floydene Flock  [phone]   Residential & Outpatient Substance Abuse Program  [phone]   Psychological Services Organization         Address  Phone  Notes  [facility] Health  [phone]   [facility]  [phone]   [facility] [address] [place] [phone] or [phone]    Mobile Crisis Teams Organization         Address  Phone  Notes  [facility], Mobile Crisis Care Unit  [phone]   [facility]  [address] [place], [place] [phone]   [facility] [address] [place] [phone]    Self-Help/Support Groups Organization  Address  Phone             Notes  Mental Health Assoc.  of [place] - variety of support groups  336- [phone] Call for more information  Narcotics Anonymous (NA), [facility] [address], [place]  2 meetings at this location   Statisticianesidential Treatment Programs Organization         Address  Phone  Notes  [facility] [address],    [place]  [phone]   [facility]  [address], [place] [phone], [place], [place] [phone]   [facility] [address], [place] [phone] Admissions: 8am-3pm M-F  [facility] [address]    [place], [place] [phone]   [facility] [address], [place], [place] [phone]   [facility] [address]  [place], [place] [phone]   [facility] - Intensive Outpatient [address], [place], [place] [phone]   [facility] ([facility] [address]  [place], [place] [phone] or [phone]   [facility] ([facility]) [address] [place], [place] [phone] Accepts Medicaid  [facility] [address]  [place] [phone] Substance Abuse/Addiction Treatment   [facility] Behavioral Health Resources Organization         Address  Phone  Notes  [facility]  [phone]) [phone]   Angie FavaJulie Brannon, PhD [address], [address] [place], [place]   [phone](336) [phone] or [phone](336) [phone]   [facility]   [address] [place], [place] [phone](336) [phone]   [facility] [address], [place], [place] [phone]) [phone] Insurance/Medicaid/sponsorship through [facility]  [facility]ith [facility] Families [address]                                    [place], [place] [phone]) [phone] Therapy/tele-psych/case  [facility] [address]   [place], [place] [phone](336) [phone]    Dr. Lolly MustacheArfeen  [phone](336) [phone]   [facility] [place]  [employer] [facility] 1) [address], [place] 2)  [address], [place] 3)  [address], [place] [phone](336) [phone](336) [phone]  [phone](336) [phone]   [facility] Child Abuse Hotline [phone](336) [phone] or [phone](336) [phone] (After Hours)

## 2013-10-13 NOTE — ED Provider Notes (Signed)
Medical screening examination/treatment/procedure(s) were performed by non-physician practitioner and as supervising physician I was immediately available for consultation/collaboration.   EKG Interpretation   Date/Time:  Friday October 11 2013 07:58:14 EDT Ventricular Rate:  69 PR Interval:  158 QRS Duration: 82 QT Interval:  386 QTC Calculation: 413 R Axis:   56 Text Interpretation:  Normal sinus rhythm Normal ECG since last tracing no  significant change Confirmed by Effie ShyWENTZ  MD, Skylor Schnapp [phone]) on 10/11/2013  8:42:00 AM       Flint MelterElliott L Ashna Dorough, MD 10/13/13 2129

## 2014-02-18 ENCOUNTER — Encounter (HOSPITAL_COMMUNITY): Payer: Self-pay | Admitting: Emergency Medicine

## 2014-02-18 ENCOUNTER — Emergency Department (HOSPITAL_COMMUNITY)
Admission: EM | Admit: 2014-02-18 | Discharge: 2014-02-19 | Disposition: A | Discharge status: Home / Self Care | Payer: No Typology Code available for payment source | Attending: Emergency Medicine | Admitting: Emergency Medicine

## 2014-02-18 ENCOUNTER — Emergency Department (HOSPITAL_COMMUNITY): Payer: No Typology Code available for payment source

## 2014-02-18 DIAGNOSIS — J45909 Unspecified asthma, uncomplicated: Secondary | ICD-10-CM | POA: Insufficient documentation

## 2014-02-18 DIAGNOSIS — S139XXA Sprain of joints and ligaments of unspecified parts of neck, initial encounter: Secondary | ICD-10-CM | POA: Insufficient documentation

## 2014-02-18 DIAGNOSIS — F17200 Nicotine dependence, unspecified, uncomplicated: Secondary | ICD-10-CM | POA: Insufficient documentation

## 2014-02-18 DIAGNOSIS — Z8659 Personal history of other mental and behavioral disorders: Secondary | ICD-10-CM | POA: Insufficient documentation

## 2014-02-18 DIAGNOSIS — E669 Obesity, unspecified: Secondary | ICD-10-CM | POA: Insufficient documentation

## 2014-02-18 DIAGNOSIS — I10 Essential (primary) hypertension: Secondary | ICD-10-CM | POA: Insufficient documentation

## 2014-02-18 DIAGNOSIS — Y9389 Activity, other specified: Secondary | ICD-10-CM | POA: Insufficient documentation

## 2014-02-18 DIAGNOSIS — IMO0002 Reserved for concepts with insufficient information to code with codable children: Secondary | ICD-10-CM | POA: Insufficient documentation

## 2014-02-18 DIAGNOSIS — Z79899 Other long term (current) drug therapy: Secondary | ICD-10-CM | POA: Insufficient documentation

## 2014-02-18 DIAGNOSIS — M545 Low back pain: Secondary | ICD-10-CM

## 2014-02-18 DIAGNOSIS — S161XXA Strain of muscle, fascia and tendon at neck level, initial encounter: Secondary | ICD-10-CM

## 2014-02-18 DIAGNOSIS — R030 Elevated blood-pressure reading, without diagnosis of hypertension: Secondary | ICD-10-CM

## 2014-02-18 DIAGNOSIS — G8929 Other chronic pain: Secondary | ICD-10-CM | POA: Insufficient documentation

## 2014-02-18 DIAGNOSIS — Y92410 Unspecified street and highway as the place of occurrence of the external cause: Secondary | ICD-10-CM | POA: Insufficient documentation
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
## 2014-02-19 MED ORDER — TRAMADOL HCL 50 MG PO TABS: 50.0000 mg | ORAL_TABLET | Freq: Four times a day (QID) | ORAL | Status: DC | PRN

## 2014-02-19 MED ORDER — OXYCODONE-ACETAMINOPHEN 5-325 MG PO TABS
1.0000 | ORAL_TABLET | Freq: Once | ORAL | Status: AC
  Administered 2014-02-19: 1 via ORAL
  Filled 2014-02-19: qty 1

## 2014-02-19 MED ORDER — CYCLOBENZAPRINE HCL 10 MG PO TABS: 10.0000 mg | ORAL_TABLET | Freq: Every day | ORAL | Status: DC

## 2014-02-19 MED ORDER — IBUPROFEN 800 MG PO TABS: 800.0000 mg | ORAL_TABLET | Freq: Three times a day (TID) | ORAL | Status: DC
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
## 2014-02-19 NOTE — Discharge Instructions (Signed)
Call for a follow up appointment with a Family or Primary Care Provider.  Ice your back 3-4 times a day. Return if Symptoms worsen.   Take medication as prescribed.  Do not operate heavy machinery or drink alcohol while taking Ultram or Flexeril.

## 2014-02-19 NOTE — Progress Notes (Signed)
ED CM [facility]. Clarification done.
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
## 2014-02-20 NOTE — ED Provider Notes (Signed)
Medical screening examination/treatment/procedure(s) were performed by non-physician practitioner and as supervising physician I was immediately available for consultation/collaboration.    Vian Fluegel D Kyian Obst, MD 02/20/14 0407 

## 2014-05-03 ENCOUNTER — Encounter (HOSPITAL_COMMUNITY): Payer: Self-pay | Admitting: Emergency Medicine

## 2014-05-03 ENCOUNTER — Emergency Department (HOSPITAL_COMMUNITY)
Admission: EM | Admit: 2014-05-03 | Discharge: 2014-05-03 | Disposition: A | Discharge status: Home / Self Care | Payer: Medicaid Other | Attending: Emergency Medicine | Admitting: Emergency Medicine

## 2014-05-03 DIAGNOSIS — S299XXA Unspecified injury of thorax, initial encounter: Secondary | ICD-10-CM | POA: Diagnosis not present

## 2014-05-03 DIAGNOSIS — Y9389 Activity, other specified: Secondary | ICD-10-CM | POA: Diagnosis not present

## 2014-05-03 DIAGNOSIS — Y92410 Unspecified street and highway as the place of occurrence of the external cause: Secondary | ICD-10-CM | POA: Diagnosis not present

## 2014-05-03 DIAGNOSIS — Z791 Long term (current) use of non-steroidal anti-inflammatories (NSAID): Secondary | ICD-10-CM | POA: Diagnosis not present

## 2014-05-03 DIAGNOSIS — M549 Dorsalgia, unspecified: Secondary | ICD-10-CM

## 2014-05-03 DIAGNOSIS — I10 Essential (primary) hypertension: Secondary | ICD-10-CM | POA: Diagnosis not present

## 2014-05-03 DIAGNOSIS — Z720 Tobacco use: Secondary | ICD-10-CM | POA: Insufficient documentation

## 2014-05-03 DIAGNOSIS — Z79899 Other long term (current) drug therapy: Secondary | ICD-10-CM | POA: Insufficient documentation

## 2014-05-03 DIAGNOSIS — F329 Major depressive disorder, single episode, unspecified: Secondary | ICD-10-CM | POA: Diagnosis not present

## 2014-05-03 DIAGNOSIS — E669 Obesity, unspecified: Secondary | ICD-10-CM | POA: Insufficient documentation

## 2014-05-03 DIAGNOSIS — S4991XA Unspecified injury of right shoulder and upper arm, initial encounter: Secondary | ICD-10-CM | POA: Diagnosis present

## 2014-05-03 DIAGNOSIS — J45909 Unspecified asthma, uncomplicated: Secondary | ICD-10-CM | POA: Diagnosis not present

## 2014-05-03 MED ORDER — OXYCODONE-ACETAMINOPHEN 5-325 MG PO TABS: 1.0000 | ORAL_TABLET | ORAL | Status: DC | PRN

## 2014-05-03 MED ORDER — KETOROLAC TROMETHAMINE 60 MG/2ML IM SOLN
60.0000 mg | Freq: Once | INTRAMUSCULAR | Status: AC
  Administered 2014-05-03: 60 mg via INTRAMUSCULAR
  Filled 2014-05-03: qty 2

## 2014-05-03 MED ORDER — OXYCODONE-ACETAMINOPHEN 5-325 MG PO TABS
1.0000 | ORAL_TABLET | Freq: Once | ORAL | Status: AC
  Administered 2014-05-03: 1 via ORAL
  Filled 2014-05-03: qty 1
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
## 2014-06-19 ENCOUNTER — Encounter (HOSPITAL_COMMUNITY): Payer: Self-pay | Admitting: Emergency Medicine

## 2014-06-19 ENCOUNTER — Emergency Department (HOSPITAL_COMMUNITY)
Admission: EM | Admit: 2014-06-19 | Discharge: 2014-06-19 | Disposition: A | Discharge status: Home / Self Care | Payer: Medicaid Other | Attending: Emergency Medicine | Admitting: Emergency Medicine

## 2014-06-19 DIAGNOSIS — Z720 Tobacco use: Secondary | ICD-10-CM | POA: Insufficient documentation

## 2014-06-19 DIAGNOSIS — Z791 Long term (current) use of non-steroidal anti-inflammatories (NSAID): Secondary | ICD-10-CM | POA: Insufficient documentation

## 2014-06-19 DIAGNOSIS — Z79899 Other long term (current) drug therapy: Secondary | ICD-10-CM | POA: Diagnosis not present

## 2014-06-19 DIAGNOSIS — F410 Panic disorder [episodic paroxysmal anxiety] without agoraphobia: Secondary | ICD-10-CM | POA: Insufficient documentation

## 2014-06-19 DIAGNOSIS — F329 Major depressive disorder, single episode, unspecified: Secondary | ICD-10-CM | POA: Insufficient documentation

## 2014-06-19 DIAGNOSIS — J45909 Unspecified asthma, uncomplicated: Secondary | ICD-10-CM | POA: Diagnosis not present

## 2014-06-19 DIAGNOSIS — H8110 Benign paroxysmal vertigo, unspecified ear: Secondary | ICD-10-CM | POA: Diagnosis not present

## 2014-06-19 DIAGNOSIS — E669 Obesity, unspecified: Secondary | ICD-10-CM | POA: Diagnosis not present

## 2014-06-19 DIAGNOSIS — I10 Essential (primary) hypertension: Secondary | ICD-10-CM | POA: Insufficient documentation

## 2014-06-19 DIAGNOSIS — R42 Dizziness and giddiness: Secondary | ICD-10-CM | POA: Diagnosis present

## 2014-06-19 LAB — I-STAT CHEM 8, ED
BUN: 17 mg/dL (ref 6–23)
Calcium, Ion: 1.19 mmol/L (ref 1.12–1.23)
Chloride: 105 mEq/L (ref 96–112)
Creatinine, Ser: 0.9 mg/dL (ref 0.50–1.10)
Glucose, Bld: 91 mg/dL (ref 70–99)
HCT: 41 % (ref 36.0–46.0)
Hemoglobin: 13.9 g/dL (ref 12.0–15.0)
Potassium: 3.8 mEq/L (ref 3.7–5.3)
SODIUM: 140 meq/L (ref 137–147)
TCO2: 24 mmol/L (ref 0–100)

## 2014-06-19 LAB — BASIC METABOLIC PANEL
Anion gap: 12 (ref 5–15)
BUN: 15 mg/dL (ref 6–23)
CO2: 23 mEq/L (ref 19–32)
Calcium: 8.8 mg/dL (ref 8.4–10.5)
Chloride: 103 mEq/L (ref 96–112)
Creatinine, Ser: 0.8 mg/dL (ref 0.50–1.10)
GLUCOSE: 87 mg/dL (ref 70–99)
POTASSIUM: 4.1 meq/L (ref 3.7–5.3)
SODIUM: 138 meq/L (ref 137–147)

## 2014-06-19 LAB — CBC WITH DIFFERENTIAL/PLATELET
BASOS ABS: 0 10*3/uL (ref 0.0–0.1)
Basophils Relative: 0 % (ref 0–1)
EOS ABS: 0.5 10*3/uL (ref 0.0–0.7)
Eosinophils Relative: 8 % — ABNORMAL HIGH (ref 0–5)
HCT: 36.6 % (ref 36.0–46.0)
Hemoglobin: 12.4 g/dL (ref 12.0–15.0)
LYMPHS ABS: 2.6 10*3/uL (ref 0.7–4.0)
Lymphocytes Relative: 45 % (ref 12–46)
MCH: 31.6 pg (ref 26.0–34.0)
MCHC: 33.9 g/dL (ref 30.0–36.0)
MCV: 93.1 fL (ref 78.0–100.0)
Monocytes Absolute: 0.3 10*3/uL (ref 0.1–1.0)
Monocytes Relative: 5 % (ref 3–12)
NEUTROS PCT: 42 % — AB (ref 43–77)
Neutro Abs: 2.5 10*3/uL (ref 1.7–7.7)
Platelets: 231 10*3/uL (ref 150–400)
RBC: 3.93 MIL/uL (ref 3.87–5.11)
RDW: 13.7 % (ref 11.5–15.5)
WBC: 5.8 10*3/uL (ref 4.0–10.5)

## 2014-06-19 MED ORDER — ONDANSETRON 4 MG PO TBDP: 4.0000 mg | ORAL_TABLET | Freq: Three times a day (TID) | ORAL | Status: DC | PRN

## 2014-06-19 MED ORDER — MECLIZINE HCL 25 MG PO TABS
25.0000 mg | ORAL_TABLET | Freq: Once | ORAL | Status: AC
  Administered 2014-06-19: 25 mg via ORAL
  Filled 2014-06-19: qty 1

## 2014-06-19 MED ORDER — SODIUM CHLORIDE 0.9 % IV BOLUS (SEPSIS)
1000.0000 mL | Freq: Once | INTRAVENOUS | Status: AC
Start: 2014-06-19 — End: 2014-06-19
  Administered 2014-06-19: 1000 mL via INTRAVENOUS

## 2014-06-19 MED ORDER — SODIUM CHLORIDE 0.9 % IV BOLUS (SEPSIS)
1000.0000 mL | Freq: Once | INTRAVENOUS | Status: AC
  Administered 2014-06-19: 1000 mL via INTRAVENOUS

## 2014-06-19 MED ORDER — MECLIZINE HCL 50 MG PO TABS: 50.0000 mg | ORAL_TABLET | Freq: Three times a day (TID) | ORAL | Status: DC | PRN

## 2014-06-19 MED ORDER — METOCLOPRAMIDE HCL 5 MG/ML IJ SOLN
10.0000 mg | Freq: Once | INTRAMUSCULAR | Status: AC
  Administered 2014-06-19: 10 mg via INTRAVENOUS
  Filled 2014-06-19: qty 2

## 2014-06-19 MED ORDER — ONDANSETRON HCL 4 MG/2ML IJ SOLN
4.0000 mg | Freq: Once | INTRAMUSCULAR | Status: AC
  Administered 2014-06-19: 4 mg via INTRAVENOUS
  Filled 2014-06-19: qty 2

## 2014-06-19 NOTE — Discharge Instructions (Signed)
Benign Positional Vertigo Vertigo means you feel like you or your surroundings are moving when they are not. Benign positional vertigo is the most common form of vertigo. Benign means that the cause of your condition is not serious. Benign positional vertigo is more common in older adults. CAUSES  Benign positional vertigo is the result of an upset in the labyrinth system. This is an area in the middle ear that helps control your balance. This may be caused by a viral infection, head injury, or repetitive motion. However, often no specific cause is found. SYMPTOMS  Symptoms of benign positional vertigo occur when you move your head or eyes in different directions. Some of the symptoms may include:  Loss of balance and falls.  Vomiting.  Blurred vision.  Dizziness.  Nausea.  Involuntary eye movements (nystagmus). DIAGNOSIS  Benign positional vertigo is usually diagnosed by physical exam. If the specific cause of your benign positional vertigo is unknown, your caregiver may perform imaging tests, such as magnetic resonance imaging (MRI) or computed tomography (CT). TREATMENT  Your caregiver may recommend movements or procedures to correct the benign positional vertigo. Medicines such as meclizine, benzodiazepines, and medicines for nausea may be used to treat your symptoms. In rare cases, if your symptoms are caused by certain conditions that affect the inner ear, you may need surgery. HOME CARE INSTRUCTIONS   Follow your caregiver's instructions.  Move slowly. Do not make sudden body or head movements.  Avoid driving.  Avoid operating heavy machinery.  Avoid performing any tasks that would be dangerous to you or others during a vertigo episode.  Drink enough fluids to keep your urine clear or pale yellow. SEEK IMMEDIATE MEDICAL CARE IF:   You develop problems with walking, weakness, numbness, or using your arms, hands, or legs.  You have difficulty speaking.  You develop  severe headaches.  Your nausea or vomiting continues or gets worse.  You develop visual changes.  Your family or friends notice any behavioral changes.  Your condition gets worse.  You have a fever.  You develop a stiff neck or sensitivity to light. MAKE SURE YOU:   Understand these instructions.  Will watch your condition.  Will get help right away if you are not doing well or get worse. Document Released: 04/11/2006 Document Revised: 09/26/2011 Document Reviewed: 03/24/2011 Exit[employer] Patient Information 2015 [employer]. This information is not intended to replace advice given to you by your health care provider. Make sure you discuss any questions you have with your health care provider.    

## 2014-06-19 NOTE — ED Notes (Signed)
Brought pt back to room via wheelchair; pt getting undressed and into a gown at this time; Romeo AppleBen, RN present in room

## 2014-06-19 NOTE — ED Provider Notes (Signed)
CSN: [id number]     Arrival date & time 06/19/14  1205 History   First MD Initiated Contact with Patient 06/19/14 1212     Chief Complaint  Patient presents with  . Dizziness  . Nausea     (Consider location/radiation/quality/duration/timing/severity/associated sxs/prior Treatment) HPI   Patient has a PMH of bipolar disorder, hypertension, asthma, obesity, depression and panic attacks. She comes to the ED with complaints of dizziness, ear fullness, headache, ear echoing for the past 3 days. Her symptoms started with left ear pain and fullness sensation. She then developed dizziness that is comparable to rocking on a boat. Then she developed frontal headache and nausea with photophobia and phonophobia. Her symptoms are all become worse with worsening of dizziness. The dizziness is worsened by change of position. She drove herself to the ED and feels as though when she drives, because she is focusing, the dizziness resolves. NO bowel or bladder incontinence. Fevers, weakness, confusion, aphagia, CP, SOB. She is not on BC, no recent injury;surgery, no long trips or hx of blood clots.     Past Medical History  Diagnosis Date  . Asthma   . Hypertension   . Obesity   . Bipolar 1 disorder   . Depressed   . Panic attacks    Past Surgical History  Procedure Laterality Date  . Tubal ligation     History reviewed. No pertinent family history. History  Substance Use Topics  . Smoking status: Current Every Day Smoker    Types: Cigarettes    Last Attempt to Quit: 07/03/2009  . Smokeless tobacco: Not on file  . Alcohol Use: Yes     Comment: sociallly   OB History    No data available     Review of Systems  10 Systems reviewed and are negative for acute change except as noted in the HPI.     Allergies  Other  Home Medications   Prior to Admission medications   Medication Sig Start Date End Date Taking? Authorizing Provider  cyclobenzaprine (FLEXERIL) 10 MG tablet Take 1  tablet (10 mg total) by mouth at bedtime. 02/19/14   Mellody DrownLauren Parker, PA-C  ibuprofen (ADVIL,MOTRIN) 800 MG tablet Take 1 tablet (800 mg total) by mouth 3 (three) times daily with meals. 02/19/14   Mellody DrownLauren Parker, PA-C  meclizine (ANTIVERT) 50 MG tablet Take 1 tablet (50 mg total) by mouth 3 (three) times daily as needed. 06/19/14   Lamyiah Crawshaw Irine SealG Berkley Wrightsman, PA-C  ondansetron (ZOFRAN ODT) 4 MG disintegrating tablet Take 1 tablet (4 mg total) by mouth every 8 (eight) hours as needed for nausea or vomiting. 06/19/14   Colton Engdahl Irine SealG [place] Lasseter, PA-C  oxyCODONE-acetaminophen (PERCOCET/ROXICET) 5-325 MG per tablet Take 1 tablet by mouth every 4 (four) hours as needed for severe pain. 05/03/14   Lyanne CoKevin M Campos, MD  PRESCRIPTION MEDICATION Take 1 tablet by mouth daily. Blood Pressure    Historical Provider, MD  traMADol (ULTRAM) 50 MG tablet Take 1 tablet (50 mg total) by mouth every 6 (six) hours as needed. 02/19/14   Lauren Parker, PA-C   BP 145/78 mmHg  Pulse 72  Temp(Src) 98 F (36.7 C) (Oral)  Resp 22  SpO2 100% Physical Exam  Constitutional: She appears well-developed and well-nourished. No distress.  HENT:  Head: Normocephalic and atraumatic.  Eyes: Pupils are equal, round, and reactive to light.  Neck: Normal range of motion. Neck supple.  Cardiovascular: Normal rate and regular rhythm.   Pulmonary/Chest: Effort normal.  Abdominal: Soft.  Neurological:  She is alert.  Cranial nerves II-VIII and X-XII evaluated and show no deficits. Pt alert and oriented x 3 Upper and lower extremity strength is symmetrical and physiologic Normal muscular tone No facial droop Coordination intact, no limb ataxia, finger-nose-finger normal Rapid alternating movements normal No pronator drift pts dizziness is worse with side to side movements or bending forward and backwards.  Skin: Skin is warm and dry.  Nursing note and vitals reviewed.   ED Course  Procedures (including critical care time) Labs Review Labs Reviewed  CBC  WITH DIFFERENTIAL - Abnormal; Notable for the following:    Neutrophils Relative % 42 (*)    Eosinophils Relative 8 (*)    All other components within normal limits  BASIC METABOLIC PANEL  I-STAT CHEM 8, ED  I-STAT BETA HCG BLOOD, ED ([facility], [facility], [facility] ONLY)    Imaging Review No results found.   EKG Interpretation None      MDM   Final diagnoses:  Vertigo, benign positional, unspecified laterality    Medications  sodium chloride 0.9 % bolus 1,000 mL (0 mLs Intravenous Stopped 06/19/14 1449)  meclizine (ANTIVERT) tablet 25 mg (25 mg Oral Given 06/19/14 1257)  ondansetron (ZOFRAN) injection 4 mg (4 mg Intravenous Given 06/19/14 1257)  sodium chloride 0.9 % bolus 1,000 mL (0 mLs Intravenous Stopped 06/19/14 1403)  meclizine (ANTIVERT) tablet 25 mg (25 mg Oral Given 06/19/14 1406)  metoCLOPramide (REGLAN) injection 10 mg (10 mg Intravenous Given 06/19/14 1420)    Patient currently on her period and her HCG was lost by lab.  She has had complete resolution of her symptoms after IV fluids and Antivert. She has eaten PO in the ED and walked to the bathroom smiling and says she feels "way better".  The patient denies any symptoms of neurological impairment or TIA's; no amaurosis, diplopia, dysphasia, or unilateral disturbance of motor or sensory function. No loss of balance.  Will rx her some Zofran and Antivert for treatment management. Referral to PCP and [facility]. Return precautions given  38 y.o.Mechele DawleyLakiesha M Singleton's evaluation in the Emergency Department is complete. It has been determined that no acute conditions requiring further emergency intervention are present at this time. The patient/guardian have been advised of the diagnosis and plan. We have discussed signs and symptoms that warrant return to the ED, such as changes or worsening in symptoms.  Vital signs are stable at discharge. Filed Vitals:   06/19/14 1342  BP: 145/78  Pulse: 72  Temp:   Resp: 22     Patient/guardian has voiced understanding and agreed to follow-up with the PCP or specialist.    Dorthula Matasiffany G Ivann Trimarco, PA-C 06/19/14 1451  Nelia Shiobert L Beaton, MD 06/21/14 [phone]

## 2014-06-19 NOTE — ED Notes (Signed)
Ambulatory to bathroom with no difficulty.

## 2014-06-19 NOTE — ED Notes (Signed)
Pt c/o dizziness x 3 days that feels like room is spinning; pt sts some nausea and has not been taking her htn meds x 6 days

## 2015-04-10 ENCOUNTER — Emergency Department (HOSPITAL_COMMUNITY): Payer: Medicaid Other

## 2015-04-10 ENCOUNTER — Encounter (HOSPITAL_COMMUNITY): Payer: Self-pay | Admitting: Adult Health

## 2015-04-10 ENCOUNTER — Emergency Department (HOSPITAL_COMMUNITY)
Admission: EM | Admit: 2015-04-10 | Discharge: 2015-04-10 | Disposition: A | Discharge status: Home / Self Care | Payer: Medicaid Other | Attending: Emergency Medicine | Admitting: Emergency Medicine

## 2015-04-10 DIAGNOSIS — J45901 Unspecified asthma with (acute) exacerbation: Secondary | ICD-10-CM | POA: Diagnosis not present

## 2015-04-10 DIAGNOSIS — Z720 Tobacco use: Secondary | ICD-10-CM | POA: Insufficient documentation

## 2015-04-10 DIAGNOSIS — Z791 Long term (current) use of non-steroidal anti-inflammatories (NSAID): Secondary | ICD-10-CM | POA: Insufficient documentation

## 2015-04-10 DIAGNOSIS — I10 Essential (primary) hypertension: Secondary | ICD-10-CM | POA: Diagnosis not present

## 2015-04-10 DIAGNOSIS — J019 Acute sinusitis, unspecified: Secondary | ICD-10-CM

## 2015-04-10 DIAGNOSIS — R0602 Shortness of breath: Secondary | ICD-10-CM | POA: Diagnosis present

## 2015-04-10 DIAGNOSIS — E669 Obesity, unspecified: Secondary | ICD-10-CM | POA: Insufficient documentation

## 2015-04-10 DIAGNOSIS — F410 Panic disorder [episodic paroxysmal anxiety] without agoraphobia: Secondary | ICD-10-CM | POA: Insufficient documentation

## 2015-04-10 DIAGNOSIS — Z79899 Other long term (current) drug therapy: Secondary | ICD-10-CM | POA: Diagnosis not present

## 2015-04-10 MED ORDER — AMOXICILLIN 500 MG PO CAPS
1000.0000 mg | ORAL_CAPSULE | Freq: Once | ORAL | Status: AC
  Administered 2015-04-10: 1000 mg via ORAL
  Filled 2015-04-10: qty 2

## 2015-04-10 MED ORDER — PREDNISONE 50 MG PO TABS: 50.0000 mg | ORAL_TABLET | Freq: Every day | ORAL | Status: DC

## 2015-04-10 MED ORDER — ALBUTEROL SULFATE HFA 108 (90 BASE) MCG/ACT IN AERS: 2.0000 | INHALATION_SPRAY | RESPIRATORY_TRACT | Status: DC | PRN

## 2015-04-10 MED ORDER — PREDNISONE 20 MG PO TABS
60.0000 mg | ORAL_TABLET | Freq: Once | ORAL | Status: AC
  Administered 2015-04-10: 60 mg via ORAL
  Filled 2015-04-10: qty 3

## 2015-04-10 MED ORDER — ALBUTEROL SULFATE (2.5 MG/3ML) 0.083% IN NEBU
5.0000 mg | INHALATION_SOLUTION | Freq: Once | RESPIRATORY_TRACT | Status: AC
Start: 2015-04-10 — End: 2015-04-10
  Administered 2015-04-10: 5 mg via RESPIRATORY_TRACT
  Filled 2015-04-10: qty 6

## 2015-04-10 MED ORDER — AMOXICILLIN 500 MG PO CAPS: 1000.0000 mg | ORAL_CAPSULE | Freq: Two times a day (BID) | ORAL | Status: DC

## 2015-04-10 NOTE — ED Provider Notes (Signed)
CSN: [id number]     Arrival date & time 04/10/15  0135 History  This chart was scribed for Paula Booze, MD by Freida Busman, ED Scribe. This patient was seen in room A01C/A01C and the patient's care was started 2:02 AM.  Chief Complaint  Patient presents with  . Shortness of Breath   The history is provided by the patient. No language interpreter was used.   HPI Comments:  Paula Fitzgerald is a 39 y.o. female with a history of asthma and PNA, who presents to the Emergency Department complaining of gradual onset SOB for one day. She reports associated cough with occasional yellow sputum and 5/10 pain across her chest that is worse with cough and deep breathe.  No alleviating factors noted. Pt reports possible sick contacts notes her child is currently in daycare.   PCP: Miki Kins  Past Medical History  Diagnosis Date  . Asthma   . Hypertension   . Obesity   . Bipolar 1 disorder   . Depressed   . Panic attacks    Past Surgical History  Procedure Laterality Date  . Tubal ligation     History reviewed. No pertinent family history. Social History  Substance Use Topics  . Smoking status: Current Every Day Smoker    Types: Cigarettes    Last Attempt to Quit: 07/03/2009  . Smokeless tobacco: None  . Alcohol Use: Yes     Comment: sociallly   OB History    No data available     Review of Systems  Constitutional: Negative for fever and chills.  Respiratory: Positive for cough and shortness of breath.   Cardiovascular: Positive for chest pain.  All other systems reviewed and are negative.   Allergies  Other  Home Medications   Prior to Admission medications   Medication Sig Start Date End Date Taking? Authorizing Provider  cyclobenzaprine (FLEXERIL) 10 MG tablet Take 1 tablet (10 mg total) by mouth at bedtime. 02/19/14   Mellody Drown, PA-C  ibuprofen (ADVIL,MOTRIN) 800 MG tablet Take 1 tablet (800 mg total) by mouth 3 (three) times daily with meals. 02/19/14    Mellody Drown, PA-C  meclizine (ANTIVERT) 50 MG tablet Take 1 tablet (50 mg total) by mouth 3 (three) times daily as needed. 06/19/14   Tiffany Neva Seat, PA-C  ondansetron (ZOFRAN ODT) 4 MG disintegrating tablet Take 1 tablet (4 mg total) by mouth every 8 (eight) hours as needed for nausea or vomiting. 06/19/14   Marlon Pel, PA-C  oxyCODONE-acetaminophen (PERCOCET/ROXICET) 5-325 MG per tablet Take 1 tablet by mouth every 4 (four) hours as needed for severe pain. 05/03/14   Azalia Bilis, MD  PRESCRIPTION MEDICATION Take 1 tablet by mouth daily. Blood Pressure    Historical Provider, MD  traMADol (ULTRAM) 50 MG tablet Take 1 tablet (50 mg total) by mouth every 6 (six) hours as needed. 02/19/14   Mellody Drown, PA-C   BP 158/92 mmHg  Pulse 69  Temp(Src) 98.1 F (36.7 C) (Oral)  Resp 24  SpO2 99%  LMP 03/11/2015 (Exact Date) Physical Exam  Constitutional: She is oriented to person, place, and time. She appears well-developed and well-nourished.  HENT:  Head: Normocephalic and atraumatic.  Mouth/Throat: Oropharynx is clear and moist.  Tenderness over frontal and maxillary sinuses No nasal drainage or edema of turbinates   Eyes: EOM are normal. Pupils are equal, round, and reactive to light.  Neck: Normal range of motion. Neck supple. No JVD present.  Cardiovascular: Normal rate, regular rhythm and  normal heart sounds.   No murmur heard. Pulmonary/Chest: Effort normal. She has wheezes. She has no rales. She exhibits no tenderness.  Scattered expiratory wheezes   Abdominal: Soft. Bowel sounds are normal. She exhibits no distension and no mass.  Musculoskeletal: Normal range of motion. She exhibits no edema.  Neurological: She is alert and oriented to person, place, and time. No cranial nerve deficit. She exhibits normal muscle tone. Coordination normal.  Skin: Skin is warm and dry. No rash noted.  Psychiatric: She has a normal mood and affect. Her behavior is normal. Judgment and thought  content normal.  Nursing note and vitals reviewed.   ED Course  Procedures   DIAGNOSTIC STUDIES:  Oxygen Saturation is 100% on Neb tx, normal by my interpretation.    COORDINATION OF CARE:  2:07 AM Discussed treatment plan with pt at bedside and pt agreed to plan.  Imaging Review Dg Chest 2 View  04/10/2015   CLINICAL DATA:  Right-sided chest pain, cough, and fever for 1 day.  EXAM: CHEST  2 VIEW  COMPARISON:  05/17/2013  FINDINGS: The heart size and mediastinal contours are within normal limits. Both lungs are clear. The visualized skeletal structures are unremarkable.  IMPRESSION: No active cardiopulmonary disease.   Electronically Signed   By: Burman Nieves M.D.   On: 04/10/2015 02:50   I have personally reviewed and evaluated these images as part of my medical decision-making.   EKG Interpretation   Date/Time:  Friday April 10 2015 01:42:42 EDT Ventricular Rate:  69 PR Interval:  180 QRS Duration: 80 QT Interval:  392 QTC Calculation: 420 R Axis:   76 Text Interpretation:  Normal sinus rhythm Normal ECG When compared with  ECG of 10/11/2013, No significant change was found Confirmed by [facility]  MD,  DAVID ([id number]) on 04/10/2015 2:07:31 AM      MDM   Final diagnoses:  Acute sinusitis, recurrence not specified, unspecified location  Asthma exacerbation    Respiratory check infection with cough and wheezing. Exam is suspicious for sinusitis. Old records are reviewed and she has several ED visits for asthma as well as pneumonia. She is given an albuterol nebulizer treatment with excellent subjective and objective relief. Following treatment, lungs are clear. Chest x-ray is obtained showing no evidence of pneumonia. However, with sinus tenderness and report of an nasal drainage, she is discharged with prescription for amoxicillin. Also given a prescription for albuterol inhaler and prednisone. Follow-up with PCP.  I personally performed the services described in this  documentation, which was scribed in my presence. The recorded information has been reviewed and is accurate.     Paula Booze, MD 04/10/15 [phone]

## 2015-04-10 NOTE — ED Notes (Signed)
C/o chest pain; "breathing hurt"

## 2015-04-10 NOTE — ED Notes (Signed)
Presents with dry cough, SOB, and nausea began 24 hours ago, no fevers. Left lung with expiratory wheezes, feels like ears and throat are sore and sinus drainage.

## 2015-04-10 NOTE — Discharge Instructions (Signed)
Sinusitis °Sinusitis is redness, soreness, and inflammation of the paranasal sinuses. Paranasal sinuses are air pockets within the bones of your face (beneath the eyes, the middle of the forehead, or above the eyes). In healthy paranasal sinuses, mucus is able to drain out, and air is able to circulate through them by way of your nose. However, when your paranasal sinuses are inflamed, mucus and air can become trapped. This can allow bacteria and other germs to grow and cause infection. °Sinusitis can develop quickly and last only a short time (acute) or continue over a long period (chronic). Sinusitis that lasts for more than 12 weeks is considered chronic.  °CAUSES  °Causes of sinusitis include: °· Allergies. °· Structural abnormalities, such as displacement of the cartilage that separates your nostrils (deviated septum), which can decrease the air flow through your nose and sinuses and affect sinus drainage. °· Functional abnormalities, such as when the small hairs (cilia) that line your sinuses and help remove mucus do not work properly or are not present. °SIGNS AND SYMPTOMS  °Symptoms of acute and chronic sinusitis are the same. The primary symptoms are pain and pressure around the affected sinuses. Other symptoms include: °· Upper toothache. °· Earache. °· Headache. °· Bad breath. °· Decreased sense of smell and taste. °· A cough, which worsens when you are lying flat. °· Fatigue. °· Fever. °· Thick drainage from your nose, which often is green and may contain pus (purulent). °· Swelling and warmth over the affected sinuses. °DIAGNOSIS  °Your health care provider will perform a physical exam. During the exam, your health care provider may: °· Look in your nose for signs of abnormal growths in your nostrils (nasal polyps). °· Tap over the affected sinus to check for signs of infection. °· View the inside of your sinuses (endoscopy) using an imaging device that has a light attached (endoscope). °If your health  care provider suspects that you have chronic sinusitis, one or more of the following tests may be recommended: °· Allergy tests. °· Nasal culture. A sample of mucus is taken from your nose, sent to a lab, and screened for bacteria. °· Nasal cytology. A sample of mucus is taken from your nose and examined by your health care provider to determine if your sinusitis is related to an allergy. °TREATMENT  °Most cases of acute sinusitis are related to a viral infection and will resolve on their own within 10 days. Sometimes medicines are prescribed to help relieve symptoms (pain medicine, decongestants, nasal steroid sprays, or saline sprays).  °However, for sinusitis related to a bacterial infection, your health care provider will prescribe antibiotic medicines. These are medicines that will help kill the bacteria causing the infection.  °Rarely, sinusitis is caused by a fungal infection. In theses cases, your health care provider will prescribe antifungal medicine. °For some cases of chronic sinusitis, surgery is needed. Generally, these are cases in which sinusitis recurs more than 3 times per year, despite other treatments. °HOME CARE INSTRUCTIONS  °· Drink plenty of water. Water helps thin the mucus so your sinuses can drain more easily. °· Use a humidifier. °· Inhale steam 3 to 4 times a day (for example, sit in the bathroom with the shower running). °· Apply a warm, moist washcloth to your face 3 to 4 times a day, or as directed by your health care provider. °· Use saline nasal sprays to help moisten and clean your sinuses. °· Take medicines only as directed by your health care provider. °·   If you were prescribed either an antibiotic or antifungal medicine, finish it all even if you start to feel better. SEEK IMMEDIATE MEDICAL CARE IF:  You have increasing pain or severe headaches.  You have nausea, vomiting, or drowsiness.  You have swelling around your face.  You have vision problems.  You have a stiff  neck.  You have difficulty breathing. MAKE SURE YOU:   Understand these instructions.  Will watch your condition.  Will get help right away if you are not doing well or get worse. Document Released: 07/04/2005 Document Revised: 11/18/2013 Document Reviewed: 07/19/2011 [facility] Patient Information 2015 [place], [place]. This information is not intended to replace advice given to you by your health care provider. Make sure you discuss any questions you have with your health care provider.   Asthma Asthma is a recurring condition in which the airways tighten and narrow. Asthma can make it difficult to breathe. It can cause coughing, wheezing, and shortness of breath. Asthma episodes, also called asthma attacks, range from minor to life-threatening. Asthma cannot be cured, but medicines and lifestyle changes can help control it. CAUSES Asthma is believed to be caused by inherited (genetic) and environmental factors, but its exact cause is unknown. Asthma may be triggered by allergens, lung infections, or irritants in the air. Asthma triggers are different for each person. Common triggers include:   Animal dander.  Dust mites.  Cockroaches.  Pollen from trees or grass.  Mold.  Smoke.  Air pollutants such as dust, household cleaners, hair sprays, aerosol sprays, paint fumes, strong chemicals, or strong odors.  Cold air, weather changes, and winds (which increase molds and pollens in the air).  Strong emotional expressions such as crying or laughing hard.  Stress.  Certain medicines (such as aspirin) or types of drugs (such as beta-blockers).  Sulfites in foods and drinks. Foods and drinks that may contain sulfites include dried fruit, potato chips, and sparkling grape juice.  Infections or inflammatory conditions such as the flu, a cold, or an inflammation of the nasal membranes (rhinitis).  Gastroesophageal reflux disease (GERD).  Exercise or strenuous  activity. SYMPTOMS Symptoms may occur immediately after asthma is triggered or many hours later. Symptoms include:  Wheezing.  Excessive nighttime or early morning coughing.  Frequent or severe coughing with a common cold.  Chest tightness.  Shortness of breath. DIAGNOSIS  The diagnosis of asthma is made by a review of your medical history and a physical exam. Tests may also be performed. These may include:  Lung function studies. These tests show how much air you breathe in and out.  Allergy tests.  Imaging tests such as X-rays. TREATMENT  Asthma cannot be cured, but it can usually be controlled. Treatment involves identifying and avoiding your asthma triggers. It also involves medicines. There are 2 classes of medicine used for asthma treatment:   Controller medicines. These prevent asthma symptoms from occurring. They are usually taken every day.  Reliever or rescue medicines. These quickly relieve asthma symptoms. They are used as needed and provide short-term relief. Your health care provider will help you create an asthma action plan. An asthma action plan is a written plan for managing and treating your asthma attacks. It includes a list of your asthma triggers and how they may be avoided. It also includes information on when medicines should be taken and when their dosage should be changed. An action plan may also involve the use of a device called a peak flow meter. A peak flow  meter measures how well the lungs are working. It helps you monitor your condition. HOME CARE INSTRUCTIONS   Take medicines only as directed by your health care provider. Speak with your health care provider if you have questions about how or when to take the medicines.  Use a peak flow meter as directed by your health care provider. Record and keep track of readings.  Understand and use the action plan to help minimize or stop an asthma attack without needing to seek medical care.  Control your  home environment in the following ways to help prevent asthma attacks:  Do not smoke. Avoid being exposed to secondhand smoke.  Change your heating and air conditioning filter regularly.  Limit your use of fireplaces and wood stoves.  Get rid of pests (such as roaches and mice) and their droppings.  Throw away plants if you see mold on them.  Clean your floors and dust regularly. Use unscented cleaning products.  Try to have someone else vacuum for you regularly. Stay out of rooms while they are being vacuumed and for a short while afterward. If you vacuum, use a dust mask from a hardware store, a double-layered or microfilter vacuum cleaner bag, or a vacuum cleaner with a HEPA filter.  Replace carpet with wood, tile, or vinyl flooring. Carpet can trap dander and dust.  Use allergy-proof pillows, mattress covers, and box spring covers.  Wash bed sheets and blankets every week in hot water and dry them in a dryer.  Use blankets that are made of polyester or cotton.  Clean bathrooms and kitchens with bleach. If possible, have someone repaint the walls in these rooms with mold-resistant paint. Keep out of the rooms that are being cleaned and painted.  Wash hands frequently. SEEK MEDICAL CARE IF:   You have wheezing, shortness of breath, or a cough even if taking medicine to prevent attacks.  The colored mucus you cough up (sputum) is thicker than usual.  Your sputum changes from clear or white to yellow, green, gray, or bloody.  You have any problems that may be related to the medicines you are taking (such as a rash, itching, swelling, or trouble breathing).  You are using a reliever medicine more than 2-3 times per week.  Your peak flow is still at 50-79% of your personal best after following your action plan for 1 hour.  You have a fever. SEEK IMMEDIATE MEDICAL CARE IF:   You seem to be getting worse and are unresponsive to treatment during an asthma attack.  You are  short of breath even at rest.  You get short of breath when doing very little physical activity.  You have difficulty eating, drinking, or talking due to asthma symptoms.  You develop chest pain.  You develop a fast heartbeat.  You have a bluish color to your lips or fingernails.  You are light-headed, dizzy, or faint.  Your peak flow is less than 50% of your personal best. MAKE SURE YOU:   Understand these instructions.  Will watch your condition.  Will get help right away if you are not doing well or get worse. Document Released: 07/04/2005 Document Revised: 11/18/2013 Document Reviewed: 01/31/2013 [facility] Patient Information 2015 [place], [place]. This information is not intended to replace advice given to you by your health care provider. Make sure you discuss any questions you have with your health care provider.  Albuterol inhalation aerosol What is this medicine? ALBUTEROL (al Gaspar Bidding) is a bronchodilator. It helps open  up the airways in your lungs to make it easier to breathe. This medicine is used to treat and to prevent bronchospasm. This medicine may be used for other purposes; ask your health care provider or pharmacist if you have questions. COMMON BRAND NAME(S): Proair HFA, Proventil, Proventil HFA, Respirol, Ventolin, Ventolin HFA What should I tell my health care provider before I take this medicine? They need to know if you have any of the following conditions: -diabetes -heart disease or irregular heartbeat -high blood pressure -pheochromocytoma -seizures -thyroid disease -an unusual or allergic reaction to albuterol, levalbuterol, sulfites, other medicines, foods, dyes, or preservatives -pregnant or trying to get pregnant -breast-feeding How should I use this medicine? This medicine is for inhalation through the mouth. Follow the directions on your prescription label. Take your medicine at regular intervals. Do not use more often than directed. Make  sure that you are using your inhaler correctly. Ask you doctor or health care provider if you have any questions. Talk to your pediatrician regarding the use of this medicine in children. Special care may be needed. Overdosage: If you think you have taken too much of this medicine contact a poison control center or emergency room at once. NOTE: This medicine is only for you. Do not share this medicine with others. What if I miss a dose? If you miss a dose, use it as soon as you can. If it is almost time for your next dose, use only that dose. Do not use double or extra doses. What may interact with this medicine? -anti-infectives like chloroquine and pentamidine -caffeine -cisapride -diuretics -medicines for colds -medicines for depression or for emotional or psychotic conditions -medicines for weight loss including some herbal products -methadone -some antibiotics like clarithromycin, erythromycin, levofloxacin, and linezolid -some heart medicines -steroid hormones like dexamethasone, cortisone, hydrocortisone -theophylline -thyroid hormones This list may not describe all possible interactions. Give your health care provider a list of all the medicines, herbs, non-prescription drugs, or dietary supplements you use. Also tell them if you smoke, drink alcohol, or use illegal drugs. Some items may interact with your medicine. What should I watch for while using this medicine? Tell your doctor or health care professional if your symptoms do not improve. Do not use extra albuterol. If your asthma or bronchitis gets worse while you are using this medicine, call your doctor right away. If your mouth gets dry try chewing sugarless gum or sucking hard candy. Drink water as directed. What side effects may I notice from receiving this medicine? Side effects that you should report to your doctor or health care professional as soon as possible: -allergic reactions like skin rash, itching or hives,  swelling of the face, lips, or tongue -breathing problems -chest pain -feeling faint or lightheaded, falls -high blood pressure -irregular heartbeat -fever -muscle cramps or weakness -pain, tingling, numbness in the hands or feet -vomiting Side effects that usually do not require medical attention (report to your doctor or health care professional if they continue or are bothersome): -cough -difficulty sleeping -headache -nervousness or trembling -stomach upset -stuffy or runny nose -throat irritation -unusual taste This list may not describe all possible side effects. Call your doctor for medical advice about side effects. You may report side effects to FDA at 1-800-FDA-1088. Where should I keep my medicine? Keep out of the reach of children. Store at room temperature between 15 and 30 degrees C (59 and 86 degrees F). The contents are under pressure and may burst when exposed to  heat or flame. Do not freeze. This medicine does not work as well if it is too cold. Throw away any unused medicine after the expiration date. Inhalers need to be thrown away after the labeled number of puffs have been used or by the expiration date; whichever comes first. Ventolin HFA should be thrown away 12 months after removing from foil pouch. Check the instructions that come with your medicine. NOTE: This sheet is a summary. It may not cover all possible information. If you have questions about this medicine, talk to your doctor, pharmacist, or health care provider.  2015, [employer] Standard. (2012-12-20 10:57:17)  Prednisone tablets What is this medicine? PREDNISONE (PRED ni sone) is a corticosteroid. It is commonly used to treat inflammation of the skin, joints, lungs, and other organs. Common conditions treated include asthma, allergies, and arthritis. It is also used for other conditions, such as blood disorders and diseases of the adrenal glands. This medicine may be used for other purposes; ask  your health care provider or pharmacist if you have questions. COMMON BRAND NAME(S): Deltasone, Predone, Sterapred, Sterapred DS What should I tell my health care provider before I take this medicine? They need to know if you have any of these conditions: -Cushing's syndrome -diabetes -glaucoma -heart disease -high blood pressure -infection (especially a virus infection such as chickenpox, cold sores, or herpes) -kidney disease -liver disease -mental illness -myasthenia gravis -osteoporosis -seizures -stomach or intestine problems -thyroid disease -an unusual or allergic reaction to lactose, prednisone, other medicines, foods, dyes, or preservatives -pregnant or trying to get pregnant -breast-feeding How should I use this medicine? Take this medicine by mouth with a glass of water. Follow the directions on the prescription label. Take this medicine with food. If you are taking this medicine once a day, take it in the morning. Do not take more medicine than you are told to take. Do not suddenly stop taking your medicine because you may develop a severe reaction. Your doctor will tell you how much medicine to take. If your doctor wants you to stop the medicine, the dose may be slowly lowered over time to avoid any side effects. Talk to your pediatrician regarding the use of this medicine in children. Special care may be needed. Overdosage: If you think you have taken too much of this medicine contact a poison control center or emergency room at once. NOTE: This medicine is only for you. Do not share this medicine with others. What if I miss a dose? If you miss a dose, take it as soon as you can. If it is almost time for your next dose, talk to your doctor or health care professional. You may need to miss a dose or take an extra dose. Do not take double or extra doses without advice. What may interact with this medicine? Do not take this medicine with any of the following  medications: -metyrapone -mifepristone This medicine may also interact with the following medications: -aminoglutethimide -amphotericin B -aspirin and aspirin-like medicines -barbiturates -certain medicines for diabetes, like glipizide or glyburide -cholestyramine -cholinesterase inhibitors -cyclosporine -digoxin -diuretics -ephedrine -female hormones, like estrogens and birth control pills -isoniazid -ketoconazole -NSAIDS, medicines for pain and inflammation, like ibuprofen or naproxen -phenytoin -rifampin -toxoids -vaccines -warfarin This list may not describe all possible interactions. Give your health care provider a list of all the medicines, herbs, non-prescription drugs, or dietary supplements you use. Also tell them if you smoke, drink alcohol, or use illegal drugs. Some items may interact with your  medicine. What should I watch for while using this medicine? Visit your doctor or health care professional for regular checks on your progress. If you are taking this medicine over a prolonged period, carry an identification card with your name and address, the type and dose of your medicine, and your doctor's name and address. This medicine may increase your risk of getting an infection. Tell your doctor or health care professional if you are around anyone with measles or chickenpox, or if you develop sores or blisters that do not heal properly. If you are going to have surgery, tell your doctor or health care professional that you have taken this medicine within the last twelve months. Ask your doctor or health care professional about your diet. You may need to lower the amount of salt you eat. This medicine may affect blood sugar levels. If you have diabetes, check with your doctor or health care professional before you change your diet or the dose of your diabetic medicine. What side effects may I notice from receiving this medicine? Side effects that you should report to your  doctor or health care professional as soon as possible: -allergic reactions like skin rash, itching or hives, swelling of the face, lips, or tongue -changes in emotions or moods -changes in vision -depressed mood -eye pain -fever or chills, cough, sore throat, pain or difficulty passing urine -increased thirst -swelling of ankles, feet Side effects that usually do not require medical attention (report to your doctor or health care professional if they continue or are bothersome): -confusion, excitement, restlessness -headache -nausea, vomiting -skin problems, acne, thin and shiny skin -trouble sleeping -weight gain This list may not describe all possible side effects. Call your doctor for medical advice about side effects. You may report side effects to FDA at 1-800-FDA-1088. Where should I keep my medicine? Keep out of the reach of children. Store at room temperature between 15 and 30 degrees C (59 and 86 degrees F). Protect from light. Keep container tightly closed. Throw away any unused medicine after the expiration date. NOTE: This sheet is a summary. It may not cover all possible information. If you have questions about this medicine, talk to your doctor, pharmacist, or health care provider.  2015, [employer] Standard. (2011-02-17 10:57:14)  Amoxicillin capsules or tablets What is this medicine? AMOXICILLIN (a mox i SIL in) is a penicillin antibiotic. It is used to treat certain kinds of bacterial infections. It will not work for colds, flu, or other viral infections. This medicine may be used for other purposes; ask your health care provider or pharmacist if you have questions. COMMON BRAND NAME(S): Amoxil, Moxilin, Sumox, Trimox What should I tell my health care provider before I take this medicine? They need to know if you have any of these conditions: -asthma -kidney disease -an unusual or allergic reaction to amoxicillin, other penicillins, cephalosporin antibiotics,  other medicines, foods, dyes, or preservatives -pregnant or trying to get pregnant -breast-feeding How should I use this medicine? Take this medicine by mouth with a glass of water. Follow the directions on your prescription label. You may take this medicine with food or on an empty stomach. Take your medicine at regular intervals. Do not take your medicine more often than directed. Take all of your medicine as directed even if you think your are better. Do not skip doses or stop your medicine early. Talk to your pediatrician regarding the use of this medicine in children. While this drug may be prescribed for selected conditions,  precautions do apply. Overdosage: If you think you have taken too much of this medicine contact a poison control center or emergency room at once. NOTE: This medicine is only for you. Do not share this medicine with others. What if I miss a dose? If you miss a dose, take it as soon as you can. If it is almost time for your next dose, take only that dose. Do not take double or extra doses. What may interact with this medicine? -amiloride -birth control pills -chloramphenicol -macrolides -probenecid -sulfonamides -tetracyclines This list may not describe all possible interactions. Give your health care provider a list of all the medicines, herbs, non-prescription drugs, or dietary supplements you use. Also tell them if you smoke, drink alcohol, or use illegal drugs. Some items may interact with your medicine. What should I watch for while using this medicine? Tell your doctor or health care professional if your symptoms do not improve in 2 or 3 days. Take all of the doses of your medicine as directed. Do not skip doses or stop your medicine early. If you are diabetic, you may get a false positive result for sugar in your urine with certain brands of urine tests. Check with your doctor. Do not treat diarrhea with over-the-counter products. Contact your doctor if you have  diarrhea that lasts more than 2 days or if the diarrhea is severe and watery. What side effects may I notice from receiving this medicine? Side effects that you should report to your doctor or health care professional as soon as possible: -allergic reactions like skin rash, itching or hives, swelling of the face, lips, or tongue -breathing problems -dark urine -redness, blistering, peeling or loosening of the skin, including inside the mouth -seizures -severe or watery diarrhea -trouble passing urine or change in the amount of urine -unusual bleeding or bruising -unusually weak or tired -yellowing of the eyes or skin Side effects that usually do not require medical attention (report to your doctor or health care professional if they continue or are bothersome): -dizziness -headache -stomach upset -trouble sleeping This list may not describe all possible side effects. Call your doctor for medical advice about side effects. You may report side effects to FDA at 1-800-FDA-1088. Where should I keep my medicine? Keep out of the reach of children. Store between 68 and 77 degrees F (20 and 25 degrees C). Keep bottle closed tightly. Throw away any unused medicine after the expiration date. NOTE: This sheet is a summary. It may not cover all possible information. If you have questions about this medicine, talk to your doctor, pharmacist, or health care provider.  2015, Elsevier/Gold Standard. (2007-09-25 14:10:59)

## 2015-10-06 DIAGNOSIS — J45909 Unspecified asthma, uncomplicated: Secondary | ICD-10-CM | POA: Insufficient documentation

## 2017-10-04 ENCOUNTER — Encounter (HOSPITAL_COMMUNITY): Payer: Self-pay

## 2017-10-04 ENCOUNTER — Emergency Department (HOSPITAL_COMMUNITY): Payer: Medicaid Other

## 2017-10-04 ENCOUNTER — Emergency Department (HOSPITAL_COMMUNITY)
Admission: EM | Admit: 2017-10-04 | Discharge: 2017-10-05 | Disposition: A | Discharge status: Home / Self Care | Payer: Medicaid Other | Attending: Emergency Medicine | Admitting: Emergency Medicine

## 2017-10-04 DIAGNOSIS — F17210 Nicotine dependence, cigarettes, uncomplicated: Secondary | ICD-10-CM | POA: Diagnosis not present

## 2017-10-04 DIAGNOSIS — Z79899 Other long term (current) drug therapy: Secondary | ICD-10-CM | POA: Insufficient documentation

## 2017-10-04 DIAGNOSIS — J45901 Unspecified asthma with (acute) exacerbation: Secondary | ICD-10-CM | POA: Diagnosis not present

## 2017-10-04 DIAGNOSIS — I10 Essential (primary) hypertension: Secondary | ICD-10-CM | POA: Insufficient documentation

## 2017-10-04 DIAGNOSIS — F1210 Cannabis abuse, uncomplicated: Secondary | ICD-10-CM | POA: Diagnosis not present

## 2017-10-04 DIAGNOSIS — R0602 Shortness of breath: Secondary | ICD-10-CM | POA: Diagnosis present

## 2017-10-04 DIAGNOSIS — R0789 Other chest pain: Secondary | ICD-10-CM | POA: Diagnosis not present

## 2017-10-04 MED ORDER — ALBUTEROL SULFATE (2.5 MG/3ML) 0.083% IN NEBU
5.0000 mg | INHALATION_SOLUTION | Freq: Once | RESPIRATORY_TRACT | Status: AC
  Administered 2017-10-05: 5 mg via RESPIRATORY_TRACT
  Filled 2017-10-04: qty 6

## 2017-10-04 NOTE — ED Triage Notes (Signed)
Pt presents with c/o shortness of breath and chest pain. Audible wheezing heard from patient. Ambulatory to triage. Pt also reports some coughing.

## 2017-10-05 LAB — I-STAT TROPONIN, ED: TROPONIN I, POC: 0 ng/mL (ref 0.00–0.08)

## 2017-10-05 LAB — CBC
HCT: 33.5 % — ABNORMAL LOW (ref 36.0–46.0)
Hemoglobin: 11.2 g/dL — ABNORMAL LOW (ref 12.0–15.0)
MCH: 31.7 pg (ref 26.0–34.0)
MCHC: 33.4 g/dL (ref 30.0–36.0)
MCV: 94.9 fL (ref 78.0–100.0)
Platelets: 235 10*3/uL (ref 150–400)
RBC: 3.53 MIL/uL — ABNORMAL LOW (ref 3.87–5.11)
RDW: 15.1 % (ref 11.5–15.5)
WBC: 7.3 10*3/uL (ref 4.0–10.5)

## 2017-10-05 LAB — I-STAT CHEM 8, ED
BUN: 18 mg/dL (ref 6–20)
Calcium, Ion: 1.22 mmol/L (ref 1.15–1.40)
Chloride: 109 mmol/L (ref 101–111)
Creatinine, Ser: 1.1 mg/dL — ABNORMAL HIGH (ref 0.44–1.00)
GLUCOSE: 85 mg/dL (ref 65–99)
HCT: 34 % — ABNORMAL LOW (ref 36.0–46.0)
HEMOGLOBIN: 11.6 g/dL — AB (ref 12.0–15.0)
POTASSIUM: 4 mmol/L (ref 3.5–5.1)
Sodium: 144 mmol/L (ref 135–145)
TCO2: 24 mmol/L (ref 22–32)

## 2017-10-05 LAB — BASIC METABOLIC PANEL
Anion gap: 6 (ref 5–15)
BUN: 17 mg/dL (ref 6–20)
CO2: 26 mmol/L (ref 22–32)
Calcium: 8.9 mg/dL (ref 8.9–10.3)
Chloride: 118 mmol/L — ABNORMAL HIGH (ref 101–111)
Creatinine, Ser: 1 mg/dL (ref 0.44–1.00)
GFR calc Af Amer: >60 mL/min (ref >60)
GFR calc non Af Amer: >60 mL/min (ref >60)
GLUCOSE: 86 mg/dL (ref 65–99)
Potassium: 4.8 mmol/L (ref 3.5–5.1)
Sodium: 150 mmol/L — ABNORMAL HIGH (ref 135–145)

## 2017-10-05 LAB — I-STAT BETA HCG BLOOD, ED (MC, WL, AP ONLY): I-stat hCG, quantitative: <5 m[IU]/mL (ref <5)

## 2017-10-05 MED ORDER — IPRATROPIUM-ALBUTEROL 0.5-2.5 (3) MG/3ML IN SOLN: 3.0000 mL | Freq: Once | RESPIRATORY_TRACT | Status: DC

## 2017-10-05 MED ORDER — ALBUTEROL SULFATE HFA 108 (90 BASE) MCG/ACT IN AERS
2.0000 | INHALATION_SPRAY | RESPIRATORY_TRACT | Status: DC | PRN
  Filled 2017-10-05: qty 6.7

## 2017-10-05 NOTE — ED Provider Notes (Signed)
[facility]-EMERGENCY DEPT Provider Note   CSN: [id number] Arrival date & time: 10/04/17  2332     History   Chief Complaint Chief Complaint  Patient presents with  . Chest Pain  . Shortness of Breath    HPI Paula Fitzgerald is a 42 y.o. female.  Patient presents to the emergency department with chief complaint of shortness of breath.  She reports having some wheezing.  She reports associated chest tightness.  She reports a history of asthma, but has not had an exacerbation in quite some time.  She reports slight dry cough.  Denies any fevers or chills.  Denies having taken anything for symptoms.  There are no other associated symptoms or modifying factors.  The history is provided by the patient. No language interpreter was used.    Past Medical History:  Diagnosis Date  . Asthma   . Bipolar 1 disorder (HCC)   . Depressed   . Hypertension   . Obesity   . Panic attacks     Patient Active Problem List   Diagnosis Date Noted  . Severe uncontrolled hypertension 07/11/2011  . Morbid obesity (HCC) 07/11/2011  . PNA (pneumonia) 07/04/2011  . OBSTRUCTIVE SLEEP APNEA 01/22/2010  . BIPOLAR DISORDER UNSPECIFIED 12/23/2009  . HYPERTENSION 12/23/2009  . ASTHMA 12/23/2009    Past Surgical History:  Procedure Laterality Date  . TUBAL LIGATION      OB History   None      Home Medications    Prior to Admission medications   Medication Sig Start Date End Date Taking? Authorizing Provider  hydrochlorothiazide (HYDRODIURIL) 25 MG tablet Take 25 mg by mouth daily. 08/03/17  Yes [provider]  losartan (COZAAR) 100 MG tablet Take 100 mg by mouth daily. 08/03/17  Yes [provider]  albuterol (PROVENTIL HFA;VENTOLIN HFA) 108 (90 BASE) MCG/ACT inhaler Inhale 2 puffs into the lungs every 4 (four) hours as needed for wheezing or shortness of breath (or coughing). Patient not taking: Reported on 10/05/2017 04/10/15   Dione BoozeGlick, David, MD    amoxicillin (AMOXIL) 500 MG capsule Take 2 capsules (1,000 mg total) by mouth 2 (two) times daily. Patient not taking: Reported on 10/05/2017 04/10/15   Dione BoozeGlick, David, MD  predniSONE (DELTASONE) 50 MG tablet Take 1 tablet (50 mg total) by mouth daily. Patient not taking: Reported on 10/05/2017 04/10/15   Dione BoozeGlick, David, MD    Family History History reviewed. No pertinent family history.  Social History Social History   Tobacco Use  . Smoking status: Current Every Day Smoker    Types: Cigarettes    Last attempt to quit: 07/03/2009    Years since quitting: 8.2  Substance Use Topics  . Alcohol use: Yes    Comment: sociallly  . Drug use: Yes    Frequency: 3.0 times per week    Types: Marijuana     Allergies   Other   Review of Systems Review of Systems  All other systems reviewed and are negative.    Physical Exam Updated Vital Signs BP (!) 161/102 (BP Location: Left Wrist) Comment: Provider aware  Pulse 70   Temp 98.3 F (36.8 C) (Oral)   Resp 18   Ht 5' 3.75" (1.619 m)   Wt (!) 137.9 kg (304 lb)   LMP 09/30/2017 (Approximate)   SpO2 97%   BMI 52.59 kg/m   Physical Exam  Constitutional: She is oriented to person, place, and time. She appears well-developed and well-nourished.  HENT:  Head:  Normocephalic and atraumatic.  Eyes: Pupils are equal, round, and reactive to light. Conjunctivae and EOM are normal.  Neck: Normal range of motion. Neck supple.  Cardiovascular: Normal rate and regular rhythm. Exam reveals no gallop and no friction rub.  No murmur heard. Pulmonary/Chest: Effort normal. No respiratory distress. She has wheezes. She has no rales. She exhibits no tenderness.  Mild wheezing left upper  Abdominal: Soft. Bowel sounds are normal. She exhibits no distension and no mass. There is no tenderness. There is no rebound and no guarding.  Musculoskeletal: Normal range of motion. She exhibits no edema or tenderness.  Neurological: She is alert and oriented to  person, place, and time.  Skin: Skin is warm and dry.  Psychiatric: She has a normal mood and affect. Her behavior is normal. Judgment and thought content normal.  Nursing note and vitals reviewed.    ED Treatments / Results  Labs (all labs ordered are listed, but only abnormal results are displayed) Labs Reviewed  BASIC METABOLIC PANEL - Abnormal; Notable for the following components:      Result Value   Sodium 150 (*)    Chloride 118 (*)    All other components within normal limits  CBC - Abnormal; Notable for the following components:   RBC 3.53 (*)    Hemoglobin 11.2 (*)    HCT 33.5 (*)    All other components within normal limits  I-STAT CHEM 8, ED - Abnormal; Notable for the following components:   Creatinine, Ser 1.10 (*)    Hemoglobin 11.6 (*)    HCT 34.0 (*)    All other components within normal limits  I-STAT TROPONIN, ED  I-STAT BETA HCG BLOOD, ED ([facility], [facility], [facility] ONLY)    EKG  EKG Interpretation  Date/Time:  Wednesday October 04 2017 23:37:38 EDT Ventricular Rate:  68 PR Interval:    QRS Duration: 94 QT Interval:  387 QTC Calculation: 412 R Axis:   73 Text Interpretation:  Sinus rhythm Low voltage, precordial leads Otherwise within normal limits When compared with ECG of 04/10/2015, No significant change was found Confirmed by [name] ([id number]) on 10/04/2017 11:57:36 PM       Radiology Dg Chest 2 View  Result Date: 10/05/2017 CLINICAL DATA:  Shortness of breath.  Chest pain. EXAM: CHEST - 2 VIEW COMPARISON:  04/10/2015 FINDINGS: The cardiomediastinal contours are normal. Mild bronchial thickening. Pulmonary vasculature is normal. No consolidation, pleural effusion, or pneumothorax. No acute osseous abnormalities are seen. IMPRESSION: Mild bronchial thickening can be seen with asthma or bronchitis. Electronically Signed   By: Rubye Oaks M.D.   On: 10/05/2017 00:08    Procedures Procedures (including critical care time)  Medications Ordered in  ED Medications  albuterol (PROVENTIL HFA;VENTOLIN HFA) 108 (90 Base) MCG/ACT inhaler 2 puff (has no administration in time range)  albuterol (PROVENTIL) (2.5 MG/3ML) 0.083% nebulizer solution 5 mg (5 mg Nebulization Given 10/05/17 0010)     Initial Impression / Assessment and Plan / ED Course  I have reviewed the triage vital signs and the nursing notes.  Pertinent labs & imaging results that were available during my care of the patient were reviewed by me and considered in my medical decision making (see chart for details).     Patient with slight wheeze in left upper lobe after breathing treatment.  She states that she feels significantly improved.  Symptoms likely secondary to asthma and allergies.  Will discharge home with inhaler.  Troponin is negative.  EKG unchanged  when compared to priors.  Initial sodium was high (likely lab error), repeat was within normal limits.  Recommend PCP follow-up.  Final Clinical Impressions(s) / ED Diagnoses   Final diagnoses:  Exacerbation of asthma, unspecified asthma severity, unspecified whether persistent    ED Discharge Orders    None       Roxy Horseman, PA-C 10/05/17 0865    [name], MD 10/05/17 [phone]

## 2019-10-31 ENCOUNTER — Ambulatory Visit: Payer: Medicaid Other | Attending: Internal Medicine

## 2019-10-31 DIAGNOSIS — Z23 Encounter for immunization: Secondary | ICD-10-CM

## 2019-10-31 NOTE — Progress Notes (Signed)
   Covid-19 Vaccination Clinic  Name:  Paula Fitzgerald    MRN: [id number] DOB: [birth year]  10/31/2019  [name] was observed post Covid-19 immunization for 15 minutes without incident. She was provided with Vaccine Information Sheet and instruction to access the V-Safe system.   [name] was instructed to call 911 with any severe reactions post vaccine: Marland Kitchen Difficulty breathing  . Swelling of face and throat  . A fast heartbeat  . A bad rash all over body  . Dizziness and weakness   Immunizations Administered    Name Date Dose VIS Date Route   Pfizer COVID-19 Vaccine 10/31/2019  8:32 AM 0.3 mL 06/28/2019 Intramuscular   Manufacturer: [employer], [employer]   Lot: [id number]   NDC: [address]-5

## 2019-11-25 ENCOUNTER — Ambulatory Visit: Payer: Medicaid Other | Attending: Internal Medicine

## 2019-11-25 DIAGNOSIS — Z23 Encounter for immunization: Secondary | ICD-10-CM

## 2019-11-25 NOTE — Progress Notes (Signed)
   Covid-19 Vaccination Clinic  Name:  Paula Fitzgerald    MRN: [id number] DOB: June 12, 1976  11/25/2019  [name] was observed post Covid-19 immunization for 15 minutes without incident. She was provided with Vaccine Information Sheet and instruction to access the V-Safe system.   [name] was instructed to call 911 with any severe reactions post vaccine: Marland Kitchen Difficulty breathing  . Swelling of face and throat  . A fast heartbeat  . A bad rash all over body  . Dizziness and weakness   Immunizations Administered    Name Date Dose VIS Date Route   Pfizer COVID-19 Vaccine 11/25/2019  2:12 PM 0.3 mL 09/11/2018 Intramuscular   Manufacturer: [employer], [employer]   Lot: [id number]   NDC: [address]-1

## 2020-02-27 DIAGNOSIS — R7303 Prediabetes: Secondary | ICD-10-CM | POA: Insufficient documentation

## 2020-03-03 ENCOUNTER — Encounter (HOSPITAL_COMMUNITY): Payer: Self-pay | Admitting: Emergency Medicine

## 2020-03-03 ENCOUNTER — Other Ambulatory Visit: Payer: Self-pay

## 2020-03-03 ENCOUNTER — Emergency Department (HOSPITAL_COMMUNITY): Payer: Medicaid Other

## 2020-03-03 ENCOUNTER — Inpatient Hospital Stay (HOSPITAL_COMMUNITY)
Admission: EM | Admit: 2020-03-03 | Discharge: 2020-03-07 | DRG: 392 | Disposition: A | Discharge status: Home / Self Care | Payer: Medicaid Other | Attending: General Surgery | Admitting: General Surgery

## 2020-03-03 DIAGNOSIS — F319 Bipolar disorder, unspecified: Secondary | ICD-10-CM | POA: Diagnosis present

## 2020-03-03 DIAGNOSIS — K5720 Diverticulitis of large intestine with perforation and abscess without bleeding: Secondary | ICD-10-CM | POA: Diagnosis not present

## 2020-03-03 DIAGNOSIS — Z91018 Allergy to other foods: Secondary | ICD-10-CM

## 2020-03-03 DIAGNOSIS — I10 Essential (primary) hypertension: Secondary | ICD-10-CM | POA: Diagnosis present

## 2020-03-03 DIAGNOSIS — F17210 Nicotine dependence, cigarettes, uncomplicated: Secondary | ICD-10-CM | POA: Diagnosis present

## 2020-03-03 DIAGNOSIS — G4733 Obstructive sleep apnea (adult) (pediatric): Secondary | ICD-10-CM | POA: Diagnosis present

## 2020-03-03 DIAGNOSIS — Z9851 Tubal ligation status: Secondary | ICD-10-CM

## 2020-03-03 DIAGNOSIS — Z6841 Body Mass Index (BMI) 40.0 and over, adult: Secondary | ICD-10-CM | POA: Diagnosis not present

## 2020-03-03 DIAGNOSIS — J45909 Unspecified asthma, uncomplicated: Secondary | ICD-10-CM | POA: Diagnosis present

## 2020-03-03 DIAGNOSIS — K5792 Diverticulitis of intestine, part unspecified, without perforation or abscess without bleeding: Secondary | ICD-10-CM

## 2020-03-03 DIAGNOSIS — Z20822 Contact with and (suspected) exposure to covid-19: Secondary | ICD-10-CM | POA: Diagnosis present

## 2020-03-03 DIAGNOSIS — E876 Hypokalemia: Secondary | ICD-10-CM | POA: Diagnosis not present

## 2020-03-03 HISTORY — DX: Diverticulitis of intestine, part unspecified, without perforation or abscess without bleeding: K57.92

## 2020-03-03 LAB — BASIC METABOLIC PANEL
Anion gap: 9 (ref 5–15)
BUN: 14 mg/dL (ref 6–20)
CO2: 24 mmol/L (ref 22–32)
Calcium: 9 mg/dL (ref 8.9–10.3)
Chloride: 102 mmol/L (ref 98–111)
Creatinine, Ser: 0.88 mg/dL (ref 0.44–1.00)
GFR calc Af Amer: >60 mL/min (ref >60)
GFR calc non Af Amer: >60 mL/min (ref >60)
Glucose, Bld: 98 mg/dL (ref 70–99)
Potassium: 3.5 mmol/L (ref 3.5–5.1)
Sodium: 135 mmol/L (ref 135–145)

## 2020-03-03 LAB — CBC
HCT: 37.4 % (ref 36.0–46.0)
Hemoglobin: 12.5 g/dL (ref 12.0–15.0)
MCH: 31 pg (ref 26.0–34.0)
MCHC: 33.4 g/dL (ref 30.0–36.0)
MCV: 92.8 fL (ref 80.0–100.0)
Platelets: 244 10*3/uL (ref 150–400)
RBC: 4.03 MIL/uL (ref 3.87–5.11)
RDW: 14 % (ref 11.5–15.5)
WBC: 9 10*3/uL (ref 4.0–10.5)
nRBC: 0 % (ref 0.0–0.2)

## 2020-03-03 LAB — I-STAT BETA HCG BLOOD, ED (MC, WL, AP ONLY): I-stat hCG, quantitative: <5 m[IU]/mL (ref <5)

## 2020-03-03 LAB — SARS CORONAVIRUS 2 BY RT PCR (HOSPITAL ORDER, PERFORMED IN ~~LOC~~ HOSPITAL LAB): SARS Coronavirus 2: NEGATIVE

## 2020-03-03 MED ORDER — PIPERACILLIN-TAZOBACTAM 3.375 G IVPB
3.3750 g | Freq: Three times a day (TID) | INTRAVENOUS | Status: DC
  Administered 2020-03-03 – 2020-03-07 (×12): 3.375 g via INTRAVENOUS
  Filled 2020-03-03 (×12): qty 50

## 2020-03-03 MED ORDER — OXYCODONE-ACETAMINOPHEN 5-325 MG PO TABS
1.0000 | ORAL_TABLET | Freq: Once | ORAL | Status: AC
  Administered 2020-03-03: 1 via ORAL
  Filled 2020-03-03: qty 1

## 2020-03-03 MED ORDER — PIPERACILLIN-TAZOBACTAM 3.375 G IVPB 30 MIN
3.3750 g | Freq: Once | INTRAVENOUS | Status: AC
  Administered 2020-03-03: 3.375 g via INTRAVENOUS
  Filled 2020-03-03: qty 50

## 2020-03-03 MED ORDER — ACETAMINOPHEN 325 MG PO TABS: 650.0000 mg | ORAL_TABLET | Freq: Four times a day (QID) | ORAL | Status: DC | PRN

## 2020-03-03 MED ORDER — HYDROCHLOROTHIAZIDE 25 MG PO TABS
25.0000 mg | ORAL_TABLET | Freq: Every day | ORAL | Status: DC
  Administered 2020-03-03 – 2020-03-07 (×5): 25 mg via ORAL
  Filled 2020-03-03 (×5): qty 1

## 2020-03-03 MED ORDER — SIMETHICONE 80 MG PO CHEW
40.0000 mg | CHEWABLE_TABLET | Freq: Four times a day (QID) | ORAL | Status: DC | PRN
  Filled 2020-03-03: qty 1

## 2020-03-03 MED ORDER — ONDANSETRON 4 MG PO TBDP
4.0000 mg | ORAL_TABLET | Freq: Once | ORAL | Status: AC
  Administered 2020-03-03: 4 mg via ORAL
  Filled 2020-03-03: qty 1

## 2020-03-03 MED ORDER — SODIUM CHLORIDE 0.9 % IV SOLN: INTRAVENOUS | Status: DC

## 2020-03-03 MED ORDER — OXYCODONE HCL 5 MG PO TABS
5.0000 mg | ORAL_TABLET | ORAL | Status: DC | PRN
  Administered 2020-03-03 (×2): 10 mg via ORAL
  Administered 2020-03-04: 5 mg via ORAL
  Administered 2020-03-05: 10 mg via ORAL
  Administered 2020-03-05: 5 mg via ORAL
  Filled 2020-03-03: qty 1
  Filled 2020-03-03: qty 2
  Filled 2020-03-03: qty 1
  Filled 2020-03-03 (×3): qty 2

## 2020-03-03 MED ORDER — LOSARTAN POTASSIUM 50 MG PO TABS
100.0000 mg | ORAL_TABLET | Freq: Every day | ORAL | Status: DC
  Administered 2020-03-03 – 2020-03-07 (×5): 100 mg via ORAL
  Filled 2020-03-03 (×5): qty 2

## 2020-03-03 MED ORDER — ONDANSETRON HCL 4 MG/2ML IJ SOLN
4.0000 mg | Freq: Once | INTRAMUSCULAR | Status: AC
  Administered 2020-03-03: 4 mg via INTRAVENOUS
  Filled 2020-03-03: qty 2

## 2020-03-03 MED ORDER — METHOCARBAMOL 1000 MG/10ML IJ SOLN
500.0000 mg | Freq: Four times a day (QID) | INTRAVENOUS | Status: DC | PRN
  Filled 2020-03-03: qty 5

## 2020-03-03 MED ORDER — ACETAMINOPHEN 650 MG RE SUPP: 650.0000 mg | Freq: Four times a day (QID) | RECTAL | Status: DC | PRN

## 2020-03-03 MED ORDER — ONDANSETRON HCL 4 MG/2ML IJ SOLN
4.0000 mg | Freq: Four times a day (QID) | INTRAMUSCULAR | Status: DC | PRN
  Administered 2020-03-03: 4 mg via INTRAVENOUS
  Filled 2020-03-03 (×2): qty 2

## 2020-03-03 MED ORDER — MORPHINE SULFATE (PF) 2 MG/ML IV SOLN
2.0000 mg | INTRAVENOUS | Status: DC | PRN
  Administered 2020-03-03 – 2020-03-05 (×4): 2 mg via INTRAVENOUS
  Filled 2020-03-03 (×4): qty 1

## 2020-03-03 MED ORDER — FENTANYL CITRATE (PF) 100 MCG/2ML IJ SOLN
100.0000 ug | Freq: Once | INTRAMUSCULAR | Status: AC
  Administered 2020-03-03: 100 ug via INTRAVENOUS
  Filled 2020-03-03: qty 2

## 2020-03-03 MED ORDER — ENOXAPARIN SODIUM 40 MG/0.4ML ~~LOC~~ SOLN
40.0000 mg | Freq: Every day | SUBCUTANEOUS | Status: DC
  Administered 2020-03-03 – 2020-03-05 (×3): 40 mg via SUBCUTANEOUS
  Filled 2020-03-03 (×3): qty 0.4

## 2020-03-03 MED ORDER — ONDANSETRON 4 MG PO TBDP: 4.0000 mg | ORAL_TABLET | Freq: Four times a day (QID) | ORAL | Status: DC | PRN

## 2020-03-03 NOTE — ED Provider Notes (Signed)
[facility] EMERGENCY DEPARTMENT Provider Note   CSN: [id number] Arrival date & time: 03/03/20  3790     History Chief Complaint  Patient presents with  . Abdominal Pain    Paula Fitzgerald is a 44 y.o. female.  The history is provided by the patient.  Abdominal Pain Pain location:  Generalized Pain quality: sharp   Pain radiates to:  Does not radiate Pain severity:  Severe Onset quality:  Sudden Duration:  12 hours Timing:  Intermittent Progression:  Worsening Chronicity:  New Relieved by:  Nothing Worsened by:  Movement and palpation Associated symptoms: no dysuria, no fever and no vomiting   Risk factors: obesity   Risk factors: has not had multiple surgeries   Patient with history of asthma, bipolar disorder, obesity presents with abdominal pain.  Patient reports she began having urinary frequency yesterday.  Then she began developing diffuse severe abdominal pain. She does not typically get this pain.  She had otherwise been well.  No other acute complaints     Past Medical History:  Diagnosis Date  . Asthma   . Bipolar 1 disorder (HCC)   . Depressed   . Hypertension   . Obesity   . Panic attacks     Patient Active Problem List   Diagnosis Date Noted  . Severe uncontrolled hypertension 07/11/2011  . Morbid obesity (HCC) 07/11/2011  . PNA (pneumonia) 07/04/2011  . OBSTRUCTIVE SLEEP APNEA 01/22/2010  . BIPOLAR DISORDER UNSPECIFIED 12/23/2009  . HYPERTENSION 12/23/2009  . ASTHMA 12/23/2009    Past Surgical History:  Procedure Laterality Date  . TUBAL LIGATION       OB History   No obstetric history on file.     No family history on file.  Social History   Tobacco Use  . Smoking status: Current Every Day Smoker    Types: Cigarettes    Last attempt to quit: 07/03/2009    Years since quitting: 10.6  . Smokeless tobacco: Never Used  Substance Use Topics  . Alcohol use: Yes    Comment: sociallly  . Drug use: Yes     Frequency: 3.0 times per week    Types: Marijuana    Home Medications Prior to Admission medications   Medication Sig Start Date End Date Taking? Authorizing Provider  albuterol (PROVENTIL HFA;VENTOLIN HFA) 108 (90 BASE) MCG/ACT inhaler Inhale 2 puffs into the lungs every 4 (four) hours as needed for wheezing or shortness of breath (or coughing). Patient not taking: Reported on 10/05/2017 04/10/15   [name], MD  hydrochlorothiazide (HYDRODIURIL) 25 MG tablet Take 25 mg by mouth daily. 08/03/17   [provider]  losartan (COZAAR) 100 MG tablet Take 100 mg by mouth daily. 08/03/17   [provider]    Allergies    Other  Review of Systems   Review of Systems  Constitutional: Negative for fever.  Gastrointestinal: Positive for abdominal pain. Negative for vomiting.  Genitourinary: Positive for frequency. Negative for dysuria.  All other systems reviewed and are negative.   Physical Exam Updated Vital Signs BP (!) 144/56   Pulse 68   Temp 99 F (37.2 C) (Oral)   Resp 20   LMP 02/08/2020   SpO2 100%   Physical Exam CONSTITUTIONAL: Well developed/well nourished, uncomfortable appearing HEAD: Normocephalic/atraumatic EYES: EOMI ENMT: Mucous membranes moist, mask in place NECK: supple no meningeal signs SPINE/BACK:entire spine nontender CV: S1/S2 noted, no murmurs/rubs/gallops noted LUNGS: Lungs are clear to auscultation bilaterally, no apparent distress ABDOMEN:  soft, diffuse moderate tenderness, no rebound or guarding, bowel sounds noted throughout abdomen, obese GU:no cva tenderness NEURO: Pt is awake/alert/appropriate, moves all extremitiesx4.  No facial droop.   EXTREMITIES: pulses normal/equal, full ROM SKIN: warm, color normal PSYCH: no abnormalities of mood noted, alert and oriented to situation  ED Results / Procedures / Treatments   Labs (all labs ordered are listed, but only abnormal results are displayed) Labs Reviewed  SARS  CORONAVIRUS 2 BY RT PCR (HOSPITAL ORDER, PERFORMED IN [facility])  CBC  BASIC METABOLIC PANEL  I-STAT BETA HCG BLOOD, ED ([facility], [facility], [facility] ONLY)    EKG None  Radiology CT Renal Stone Study  Result Date: 03/03/2020 CLINICAL DATA:  Flank pain. Patient reports right-sided abdominal pain. Nausea. EXAM: CT ABDOMEN AND PELVIS WITHOUT CONTRAST TECHNIQUE: Multidetector CT imaging of the abdomen and pelvis was performed following the standard protocol without IV contrast. COMPARISON:  01/19/2012 FINDINGS: Lower chest: Stable punctate right lower lobe pulmonary nodule since 2013, considered benign. No focal consolidation or pleural fluid. Hepatobiliary: Prominent liver spanning 18 cm cranial caudal. Mild hepatic steatosis. Subcentimeter low-density lesion in the left lobe, slightly increased from prior exam but too small to accurately characterize. Gallbladder is unremarkable. Pancreas: No ductal dilatation or inflammation. Spleen: Normal in size without focal abnormality. Adrenals/Urinary Tract: Normal adrenal glands. No renal stones or hydronephrosis. No perinephric edema. Both ureters are decompressed without stones along the course. Urinary bladder is partially distended. No bladder stone. Stomach/Bowel: Large inflamed diverticulum involving the proximal sigmoid colon, series 3, image 63 with surrounding pericolonic fat stranding and edema. Multiple additional noninflamed colonic diverticula. Small amount of adjacent non organized free fluid. No evidence of abscess. There is no regional free air, however small focus of free air is noted in the upper abdomen, series 3, image 9. Few occasional right colonic diverticula are seen. The appendix is normal. There is no small bowel dilatation, inflammation, or obstruction. Stomach is unremarkable. Vascular/Lymphatic: Normal caliber abdominal aorta. No bulky abdominopelvic adenopathy. Reproductive: Uterus and bilateral adnexa are unremarkable. Other: Tiny fat  containing umbilical hernia. Small focus of free air in the upper abdomen no evidence of intra-abdominal abscess. Musculoskeletal: Degenerative change of both sacroiliac joints. There are no acute or suspicious osseous abnormalities. IMPRESSION: 1. Acute diverticulitis of the proximal sigmoid colon. There is an inflamed large diverticulum. There is no regional free air or abscess, however there is evidence of perforation with small foci of free air in the upper abdomen. 2. No renal stones or obstructive uropathy. 3. Mild hepatic steatosis. Critical Value/emergent results were called by telephone at the time of interpretation on 03/03/2020 at 2:11 am to Dr Ross Marcus , who verbally acknowledged these results. Electronically Signed   By: Narda Rutherford M.D.   On: 03/03/2020 02:11    Procedures .Critical Care Performed by: Zadie Rhine, MD Authorized by: Zadie Rhine, MD   Critical care provider statement:    Critical care time (minutes):  40   Critical care start time:  03/03/2020 4:05 AM   Critical care end time:  03/03/2020 4:45 AM   Critical care was necessary to treat or prevent imminent or life-threatening deterioration of the following conditions:  Sepsis   Critical care was time spent personally by me on the following activities:  Development of treatment plan with patient or surrogate, discussions with consultants, evaluation of patient's response to treatment, examination of patient, re-evaluation of patient's condition, pulse oximetry, ordering and review of radiographic studies, ordering and performing  treatments and interventions and ordering and review of laboratory studies   I assumed direction of critical care for this patient from another provider in my specialty: no     Medications Ordered in ED Medications  piperacillin-tazobactam (ZOSYN) IVPB 3.375 g (3.375 g Intravenous New Bag/Given 03/03/20 0437)  ondansetron (ZOFRAN-ODT) disintegrating tablet 4 mg (4 mg Oral Given  03/03/20 0103)  oxyCODONE-acetaminophen (PERCOCET/ROXICET) 5-325 MG per tablet 1 tablet (1 tablet Oral Given 03/03/20 0104)  fentaNYL (SUBLIMAZE) injection 100 mcg (100 mcg Intravenous Given 03/03/20 0435)  ondansetron (ZOFRAN) injection 4 mg (4 mg Intravenous Given 03/03/20 0433)    ED Course  I have reviewed the triage vital signs and the nursing notes.  Pertinent labs & imaging results that were available during my care of the patient were reviewed by me and considered in my medical decision making (see chart for details).    MDM Rules/Calculators/A&P                         5:02 AM Patient presented for sudden onset of abdominal pain.  CT imaging reveals perforated diverticulitis.  Patient is hemodynamically appropriate at this time She has been given IV antibiotics.  I discussed the case with Dr. Dwain Sarna with general surgery who will see the patient.   This patient presents to the ED for concern of abdominal pain, this involves an extensive number of treatment options, and is a complaint that carries with it a high risk of complications and morbidity.  The differential diagnosis includes diverticulitis, pancreatitis, kidney stone, appendicitis, bowel perforation   Lab Tests:   I Ordered, reviewed, and interpreted labs, which included complete blood count, complete metabolic panel  Medicines ordered:   I ordered medication Zosyn for diverticulitis  Imaging Studies ordered:   I ordered imaging studies which included CT imaging   I independently visualized and interpreted imaging which showed diverticulitis with perforation  Additional history obtained:    Previous records obtained and reviewed   Consultations Obtained:   I consulted general surgery Dr. Dwain Sarna  and discussed lab and imaging findings  Reevaluation:  After the interventions stated above, I reevaluated the patient and found pt is stable  Critical Interventions:  . IV antibiotics and admission  to the hospital  Final Clinical Impression(s) / ED Diagnoses Final diagnoses:  Diverticulitis of large intestine with perforation without bleeding    Rx / DC Orders ED Discharge Orders    None       Zadie Rhine, MD 03/03/20 [id number]

## 2020-03-03 NOTE — ED Triage Notes (Signed)
Pt c/o severe RLQ pain radiating to mid low abd and R flank. Onset 1400 today, intermittent, nausea. Remote hx of kidney stones

## 2020-03-03 NOTE — H&P (Signed)
Paula Fitzgerald is an 44 y.o. female.   Chief Complaint: ab pain HPI: 54 yof with acute onset of periumbilical and left sided abdominal pain about 2 pm yesterday. Never had this before. No history csc. No fevers. Nausea no emesis. No issues urinating.  Pain persists but better since she got here. Nothing making it better.  She underwent evaluation with nl wbc and renal stone ct that shows likely perforated sigmoid diverticulum.    Past Medical History:  Diagnosis Date  . Asthma   . Bipolar 1 disorder (HCC)   . Depressed   . Hypertension   . Obesity   . Panic attacks     Past Surgical History:  Procedure Laterality Date  . TUBAL LIGATION      No family history on file. Social History:  reports that she has been smoking cigarettes. She has never used smokeless tobacco. She reports current alcohol use. She reports current drug use. Frequency: 3.00 times per week. Drug: Marijuana.  Allergies:  Allergies  Allergen Reactions  . Other Hives, Rash and Other (See Comments)    Bell Pepper- Rash  Unnamed B/P med- Hives   meds none  Results for orders placed or performed during the hospital encounter of 03/03/20 (from the past 48 hour(s))  CBC     Status: None   Collection Time: 03/03/20 12:55 AM  Result Value Ref Range   WBC 9.0 4.0 - 10.5 K/uL   RBC 4.03 3.87 - 5.11 MIL/uL   Hemoglobin 12.5 12.0 - 15.0 g/dL   HCT 73.7 36 - 46 %   MCV 92.8 80.0 - 100.0 fL   MCH 31.0 26.0 - 34.0 pg   MCHC 33.4 30.0 - 36.0 g/dL   RDW 10.6 26.9 - 48.5 %   Platelets 244 150 - 400 K/uL   nRBC 0.0 0.0 - 0.2 %    Comment: Performed at [facility], [address], [place], [place] [address]  Basic metabolic panel     Status: None   Collection Time: 03/03/20 12:55 AM  Result Value Ref Range   Sodium 135 135 - 145 mmol/L   Potassium 3.5 3.5 - 5.1 mmol/L   Chloride 102 98 - 111 mmol/L   CO2 24 22 - 32 mmol/L   Glucose, Bld 98 70 - 99 mg/dL    Comment: Glucose reference range applies  only to samples taken after fasting for at least 8 hours.   BUN 14 6 - 20 mg/dL   Creatinine, Ser 3.50 0.44 - 1.00 mg/dL   Calcium 9.0 8.9 - 09.3 mg/dL   GFR calc non Af Amer >60 >60 mL/min   GFR calc Af Amer >60 >60 mL/min   Anion gap 9 5 - 15    Comment: Performed at [facility], [address], [place], [place] [address]  I-Stat Beta hCG blood, ED ([facility], [facility], [facility] only)     Status: None   Collection Time: 03/03/20  1:11 AM  Result Value Ref Range   I-stat hCG, quantitative <5.0 <5 mIU/mL   Comment 3            Comment:   GEST. AGE      CONC.  (mIU/mL)   <=1 WEEK        5 - 50     2 WEEKS       50 - 500     3 WEEKS       100 - 10,000  4 WEEKS     1,000 - 30,000        FEMALE AND NON-PREGNANT FEMALE:     LESS THAN 5 mIU/mL    CT Renal Stone Study  Result Date: 03/03/2020 CLINICAL DATA:  Flank pain. Patient reports right-sided abdominal pain. Nausea. EXAM: CT ABDOMEN AND PELVIS WITHOUT CONTRAST TECHNIQUE: Multidetector CT imaging of the abdomen and pelvis was performed following the standard protocol without IV contrast. COMPARISON:  01/19/2012 FINDINGS: Lower chest: Stable punctate right lower lobe pulmonary nodule since 2013, considered benign. No focal consolidation or pleural fluid. Hepatobiliary: Prominent liver spanning 18 cm cranial caudal. Mild hepatic steatosis. Subcentimeter low-density lesion in the left lobe, slightly increased from prior exam but too small to accurately characterize. Gallbladder is unremarkable. Pancreas: No ductal dilatation or inflammation. Spleen: Normal in size without focal abnormality. Adrenals/Urinary Tract: Normal adrenal glands. No renal stones or hydronephrosis. No perinephric edema. Both ureters are decompressed without stones along the course. Urinary bladder is partially distended. No bladder stone. Stomach/Bowel: Large inflamed diverticulum involving the proximal sigmoid colon, series 3, image 63 with surrounding pericolonic fat stranding and  edema. Multiple additional noninflamed colonic diverticula. Small amount of adjacent non organized free fluid. No evidence of abscess. There is no regional free air, however small focus of free air is noted in the upper abdomen, series 3, image 9. Few occasional right colonic diverticula are seen. The appendix is normal. There is no small bowel dilatation, inflammation, or obstruction. Stomach is unremarkable. Vascular/Lymphatic: Normal caliber abdominal aorta. No bulky abdominopelvic adenopathy. Reproductive: Uterus and bilateral adnexa are unremarkable. Other: Tiny fat containing umbilical hernia. Small focus of free air in the upper abdomen no evidence of intra-abdominal abscess. Musculoskeletal: Degenerative change of both sacroiliac joints. There are no acute or suspicious osseous abnormalities. IMPRESSION: 1. Acute diverticulitis of the proximal sigmoid colon. There is an inflamed large diverticulum. There is no regional free air or abscess, however there is evidence of perforation with small foci of free air in the upper abdomen. 2. No renal stones or obstructive uropathy. 3. Mild hepatic steatosis. Critical Value/emergent results were called by telephone at the time of interpretation on 03/03/2020 at 2:11 am to Dr Ross Marcus , who verbally acknowledged these results. Electronically Signed   By: Narda Rutherford M.D.   On: 03/03/2020 02:11    Review of Systems  Constitutional: Negative for fever.  Gastrointestinal: Positive for abdominal pain and nausea. Negative for constipation, diarrhea and vomiting.  Genitourinary: Negative for difficulty urinating and dysuria.  All other systems reviewed and are negative.   Blood pressure (!) 146/88, pulse 87, temperature 99 F (37.2 C), temperature source Oral, resp. rate 20, last menstrual period 02/08/2020, SpO2 100 %. Physical Exam Constitutional:      Appearance: She is obese.  HENT:     Head: Normocephalic and atraumatic.  Eyes:     General:  No scleral icterus.    Extraocular Movements: Extraocular movements intact.  Cardiovascular:     Rate and Rhythm: Normal rate and regular rhythm.  Pulmonary:     Effort: Pulmonary effort is normal.     Breath sounds: Normal breath sounds.  Abdominal:     General: Bowel sounds are normal. There is no distension.     Tenderness: There is abdominal tenderness in the periumbilical area and left lower quadrant.     Hernia: No hernia is present.  Skin:    General: Skin is warm and dry.     Capillary Refill: Capillary refill  takes less than 2 seconds.  Neurological:     General: No focal deficit present.     Mental Status: She is alert.  Psychiatric:        Mood and Affect: Mood normal.        Behavior: Behavior normal.      Assessment/Plan Perforated sigmoid diverticulitis -noncon ct with some small dots free air, vitals normal, wbc normal, better in er already -admit, iv abx -discussed abx, drain and possible surgery HTN- home meds Lovenox, scds  Emelia Loron, MD 03/03/2020, 5:26 AM

## 2020-03-04 LAB — CBC
HCT: 33.8 % — ABNORMAL LOW (ref 36.0–46.0)
Hemoglobin: 11.4 g/dL — ABNORMAL LOW (ref 12.0–15.0)
MCH: 31.1 pg (ref 26.0–34.0)
MCHC: 33.7 g/dL (ref 30.0–36.0)
MCV: 92.1 fL (ref 80.0–100.0)
Platelets: 215 10*3/uL (ref 150–400)
RBC: 3.67 MIL/uL — ABNORMAL LOW (ref 3.87–5.11)
RDW: 13.9 % (ref 11.5–15.5)
WBC: 8.7 10*3/uL (ref 4.0–10.5)
nRBC: 0 % (ref 0.0–0.2)

## 2020-03-04 LAB — BASIC METABOLIC PANEL
Anion gap: 8 (ref 5–15)
BUN: 7 mg/dL (ref 6–20)
CO2: 25 mmol/L (ref 22–32)
Calcium: 8.5 mg/dL — ABNORMAL LOW (ref 8.9–10.3)
Chloride: 102 mmol/L (ref 98–111)
Creatinine, Ser: 0.96 mg/dL (ref 0.44–1.00)
GFR calc Af Amer: >60 mL/min (ref >60)
GFR calc non Af Amer: >60 mL/min (ref >60)
Glucose, Bld: 87 mg/dL (ref 70–99)
Potassium: 3.4 mmol/L — ABNORMAL LOW (ref 3.5–5.1)
Sodium: 135 mmol/L (ref 135–145)

## 2020-03-04 MED ORDER — POLYETHYLENE GLYCOL 3350 17 G PO PACK
17.0000 g | PACK | Freq: Every day | ORAL | Status: DC
  Administered 2020-03-04 – 2020-03-06 (×3): 17 g via ORAL
  Filled 2020-03-04 (×4): qty 1

## 2020-03-04 NOTE — Progress Notes (Signed)
Subjective/Chief Complaint: Still fair amount of pain when she moves.    Objective: Vital signs in last 24 hours: Temp:  [98.2 F (36.8 C)-99.1 F (37.3 C)] 98.6 F (37 C) (08/18 0734) Pulse Rate:  [75-85] 75 (08/18 0734) Resp:  [15-19] 17 (08/18 0734) BP: (134-162)/(81-99) 134/81 (08/18 0734) SpO2:  [93 %-100 %] 94 % (08/18 0734)    Intake/Output from previous day: 08/17 0701 - 08/18 0700 In: 161.7 [I.V.:136.6; IV Piggyback:25.1] Out: -  Intake/Output this shift: No intake/output data recorded.  General appearance: alert and cooperative Resp: unlabroed GI: obese, soft, TTP LLQ Skin: Skin color, texture, turgor normal. No rashes or lesions  Lab Results:  Recent Labs    03/03/20 0055 03/04/20 0320  WBC 9.0 8.7  HGB 12.5 11.4*  HCT 37.4 33.8*  PLT 244 215   BMET Recent Labs    03/03/20 0055 03/04/20 0320  NA 135 135  K 3.5 3.4*  CL 102 102  CO2 24 25  GLUCOSE 98 87  BUN 14 7  CREATININE 0.88 0.96  CALCIUM 9.0 8.5*   PT/INR No results for input(s): LABPROT, INR in the last 72 hours. ABG No results for input(s): PHART, HCO3 in the last 72 hours.  Invalid input(s): PCO2, PO2  Studies/Results: CT Renal Stone Study  Result Date: 03/03/2020 CLINICAL DATA:  Flank pain. Patient reports right-sided abdominal pain. Nausea. EXAM: CT ABDOMEN AND PELVIS WITHOUT CONTRAST TECHNIQUE: Multidetector CT imaging of the abdomen and pelvis was performed following the standard protocol without IV contrast. COMPARISON:  01/19/2012 FINDINGS: Lower chest: Stable punctate right lower lobe pulmonary nodule since 2013, considered benign. No focal consolidation or pleural fluid. Hepatobiliary: Prominent liver spanning 18 cm cranial caudal. Mild hepatic steatosis. Subcentimeter low-density lesion in the left lobe, slightly increased from prior exam but too small to accurately characterize. Gallbladder is unremarkable. Pancreas: No ductal dilatation or inflammation. Spleen: Normal  in size without focal abnormality. Adrenals/Urinary Tract: Normal adrenal glands. No renal stones or hydronephrosis. No perinephric edema. Both ureters are decompressed without stones along the course. Urinary bladder is partially distended. No bladder stone. Stomach/Bowel: Large inflamed diverticulum involving the proximal sigmoid colon, series 3, image 63 with surrounding pericolonic fat stranding and edema. Multiple additional noninflamed colonic diverticula. Small amount of adjacent non organized free fluid. No evidence of abscess. There is no regional free air, however small focus of free air is noted in the upper abdomen, series 3, image 9. Few occasional right colonic diverticula are seen. The appendix is normal. There is no small bowel dilatation, inflammation, or obstruction. Stomach is unremarkable. Vascular/Lymphatic: Normal caliber abdominal aorta. No bulky abdominopelvic adenopathy. Reproductive: Uterus and bilateral adnexa are unremarkable. Other: Tiny fat containing umbilical hernia. Small focus of free air in the upper abdomen no evidence of intra-abdominal abscess. Musculoskeletal: Degenerative change of both sacroiliac joints. There are no acute or suspicious osseous abnormalities. IMPRESSION: 1. Acute diverticulitis of the proximal sigmoid colon. There is an inflamed large diverticulum. There is no regional free air or abscess, however there is evidence of perforation with small foci of free air in the upper abdomen. 2. No renal stones or obstructive uropathy. 3. Mild hepatic steatosis. Critical Value/emergent results were called by telephone at the time of interpretation on 03/03/2020 at 2:11 am to Dr Ross Marcus , who verbally acknowledged these results. Electronically Signed   By: Narda Rutherford M.D.   On: 03/03/2020 02:11    Anti-infectives: Anti-infectives (From admission, onward)   Start  Dose/Rate Route Frequency Ordered Stop   03/03/20 0730  piperacillin-tazobactam (ZOSYN)  IVPB 3.375 g     Discontinue     3.375 g 12.5 mL/hr over 240 Minutes Intravenous Every 8 hours 03/03/20 0724     03/03/20 0400  piperacillin-tazobactam (ZOSYN) IVPB 3.375 g        3.375 g 100 mL/hr over 30 Minutes Intravenous  Once 03/03/20 0354 03/03/20 0724      Assessment/Plan: Perforated sigmoid diverticulitis -noncon ct with some small dots free air, vitals normal, wbc normal, still with pain though has been trying not to take meds -Continue bowel rest, zosyn HTN- home meds Lovenox, scds  LOS: 1 day    Berna Bue 03/04/2020

## 2020-03-04 NOTE — Plan of Care (Signed)

## 2020-03-05 LAB — CBC
HCT: 33.7 % — ABNORMAL LOW (ref 36.0–46.0)
Hemoglobin: 11.6 g/dL — ABNORMAL LOW (ref 12.0–15.0)
MCH: 31.7 pg (ref 26.0–34.0)
MCHC: 34.4 g/dL (ref 30.0–36.0)
MCV: 92.1 fL (ref 80.0–100.0)
Platelets: 219 10*3/uL (ref 150–400)
RBC: 3.66 MIL/uL — ABNORMAL LOW (ref 3.87–5.11)
RDW: 13.6 % (ref 11.5–15.5)
WBC: 7.8 10*3/uL (ref 4.0–10.5)
nRBC: 0 % (ref 0.0–0.2)

## 2020-03-05 LAB — BASIC METABOLIC PANEL
Anion gap: 11 (ref 5–15)
BUN: 6 mg/dL (ref 6–20)
CO2: 23 mmol/L (ref 22–32)
Calcium: 8.5 mg/dL — ABNORMAL LOW (ref 8.9–10.3)
Chloride: 102 mmol/L (ref 98–111)
Creatinine, Ser: 1 mg/dL (ref 0.44–1.00)
GFR calc Af Amer: >60 mL/min (ref >60)
GFR calc non Af Amer: >60 mL/min (ref >60)
Glucose, Bld: 77 mg/dL (ref 70–99)
Potassium: 3.3 mmol/L — ABNORMAL LOW (ref 3.5–5.1)
Sodium: 136 mmol/L (ref 135–145)

## 2020-03-05 LAB — MAGNESIUM: Magnesium: 1.9 mg/dL (ref 1.7–2.4)

## 2020-03-05 MED ORDER — POTASSIUM CHLORIDE 10 MEQ/100ML IV SOLN
10.0000 meq | INTRAVENOUS | Status: DC
  Filled 2020-03-05: qty 100

## 2020-03-05 MED ORDER — ENOXAPARIN SODIUM 80 MG/0.8ML ~~LOC~~ SOLN
70.0000 mg | Freq: Every day | SUBCUTANEOUS | Status: DC
  Administered 2020-03-06 – 2020-03-07 (×2): 70 mg via SUBCUTANEOUS
  Filled 2020-03-05 (×2): qty 0.7

## 2020-03-05 MED ORDER — POTASSIUM CHLORIDE 10 MEQ/100ML IV SOLN
10.0000 meq | INTRAVENOUS | Status: AC
  Administered 2020-03-05 (×2): 10 meq via INTRAVENOUS
  Filled 2020-03-05: qty 100

## 2020-03-05 MED ORDER — POTASSIUM CHLORIDE 20 MEQ PO PACK
40.0000 meq | PACK | Freq: Once | ORAL | Status: AC
  Administered 2020-03-05: 40 meq via ORAL
  Filled 2020-03-05: qty 2

## 2020-03-05 NOTE — Progress Notes (Signed)
   Subjective/Chief Complaint: Pain is much better. Has not taken any pain meds since yesterday PM. Denies BM or flatus   Objective: Vital signs in last 24 hours: Temp:  [98 F (36.7 C)-99.2 F (37.3 C)] 98.7 F (37.1 C) (08/19 0426) Pulse Rate:  [74-78] 74 (08/19 0426) Resp:  [15-17] 16 (08/19 0426) BP: (137-158)/(81-93) 137/88 (08/19 0426) SpO2:  [86 %-97 %] 90 % (08/19 0426)    Intake/Output from previous day: No intake/output data recorded. Intake/Output this shift: No intake/output data recorded.  General appearance: alert and cooperative Resp: unlabroed GI: obese, soft, mildly TTP LLQ- much better than yesterday Skin: Skin color, texture, turgor normal. No rashes or lesions  Lab Results:  Recent Labs    03/04/20 0320 03/05/20 0254  WBC 8.7 7.8  HGB 11.4* 11.6*  HCT 33.8* 33.7*  PLT 215 219   BMET Recent Labs    03/04/20 0320 03/05/20 0254  NA 135 136  K 3.4* 3.3*  CL 102 102  CO2 25 23  GLUCOSE 87 77  BUN 7 6  CREATININE 0.96 1.00  CALCIUM 8.5* 8.5*   PT/INR No results for input(s): LABPROT, INR in the last 72 hours. ABG No results for input(s): PHART, HCO3 in the last 72 hours.  Invalid input(s): PCO2, PO2  Studies/Results: No results found.  Anti-infectives: Anti-infectives (From admission, onward)   Start     Dose/Rate Route Frequency Ordered Stop   03/03/20 0730  piperacillin-tazobactam (ZOSYN) IVPB 3.375 g        3.375 g 12.5 mL/hr over 240 Minutes Intravenous Every 8 hours 03/03/20 0724     03/03/20 0400  piperacillin-tazobactam (ZOSYN) IVPB 3.375 g        3.375 g 100 mL/hr over 30 Minutes Intravenous  Once 03/03/20 0354 03/03/20 0724      Assessment/Plan: Perforated sigmoid diverticulitis -noncon ct with some small dots free air, vitals normal, wbc normal, pain much improved -Try clears today, continue zosyn, miralax HTN- home meds Lovenox, scds  LOS: 2 days    Berna Bue 03/05/2020

## 2020-03-05 NOTE — Plan of Care (Signed)
  Problem: Education: Goal: Knowledge of General Education information will improve Description: Including pain rating scale, medication(s)/side effects and non-pharmacologic comfort measures Outcome: Progressing   Problem: Health Behavior/Discharge Planning: Goal: Ability to manage health-related needs will improve Outcome: Progressing   Problem: Clinical Measurements: Goal: Ability to maintain clinical measurements within normal limits will improve Outcome: Progressing Goal: Will remain free from infection Outcome: Progressing Goal: Diagnostic test results will improve Outcome: Progressing Goal: Respiratory complications will improve Outcome: Progressing Goal: Cardiovascular complication will be avoided Outcome: Progressing   Problem: Clinical Measurements: Goal: Will remain free from infection Outcome: Progressing   Problem: Clinical Measurements: Goal: Ability to maintain clinical measurements within normal limits will improve Outcome: Progressing Goal: Will remain free from infection Outcome: Progressing Goal: Diagnostic test results will improve Outcome: Progressing Goal: Respiratory complications will improve Outcome: Progressing Goal: Cardiovascular complication will be avoided Outcome: Progressing

## 2020-03-06 LAB — CBC
HCT: 34.1 % — ABNORMAL LOW (ref 36.0–46.0)
Hemoglobin: 11.7 g/dL — ABNORMAL LOW (ref 12.0–15.0)
MCH: 31.5 pg (ref 26.0–34.0)
MCHC: 34.3 g/dL (ref 30.0–36.0)
MCV: 91.9 fL (ref 80.0–100.0)
Platelets: 242 10*3/uL (ref 150–400)
RBC: 3.71 MIL/uL — ABNORMAL LOW (ref 3.87–5.11)
RDW: 13.7 % (ref 11.5–15.5)
WBC: 6.1 10*3/uL (ref 4.0–10.5)
nRBC: 0 % (ref 0.0–0.2)

## 2020-03-06 LAB — BASIC METABOLIC PANEL
Anion gap: 7 (ref 5–15)
BUN: 5 mg/dL — ABNORMAL LOW (ref 6–20)
CO2: 26 mmol/L (ref 22–32)
Calcium: 8.3 mg/dL — ABNORMAL LOW (ref 8.9–10.3)
Chloride: 102 mmol/L (ref 98–111)
Creatinine, Ser: 0.93 mg/dL (ref 0.44–1.00)
GFR calc Af Amer: >60 mL/min (ref >60)
GFR calc non Af Amer: >60 mL/min (ref >60)
Glucose, Bld: 115 mg/dL — ABNORMAL HIGH (ref 70–99)
Potassium: 3.2 mmol/L — ABNORMAL LOW (ref 3.5–5.1)
Sodium: 135 mmol/L (ref 135–145)

## 2020-03-06 LAB — MAGNESIUM: Magnesium: 1.9 mg/dL (ref 1.7–2.4)

## 2020-03-06 MED ORDER — POTASSIUM CHLORIDE CRYS ER 20 MEQ PO TBCR
30.0000 meq | EXTENDED_RELEASE_TABLET | Freq: Two times a day (BID) | ORAL | Status: AC
  Administered 2020-03-06 (×2): 30 meq via ORAL
  Filled 2020-03-06 (×2): qty 1

## 2020-03-06 NOTE — Discharge Instructions (Signed)
Diverticulitis  Diverticulitis is when small pockets in your large intestine (colon) get infected or swollen. This causes stomach pain and watery poop (diarrhea). These pouches are called diverticula. They form in people who have a condition called diverticulosis. Follow these instructions at home: Medicines  Take over-the-counter and prescription medicines only as told by your doctor. These include: ? Antibiotics. ? Pain medicines. ? Fiber pills. ? Probiotics. ? Stool softeners.  Do not drive or use heavy machinery while taking prescription pain medicine.  If you were prescribed an antibiotic, take it as told. Do not stop taking it even if you feel better. General instructions   Follow a diet as told by your doctor.  When you feel better, your doctor may tell you to change your diet. You may need to eat a lot of fiber. Fiber makes it easier to poop (have bowel movements). Healthy foods with fiber include: ? Berries. ? Beans. ? Lentils. ? Green vegetables.  Exercise 3 or more times a week. Aim for 30 minutes each time. Exercise enough to sweat and make your heart beat faster.  Keep all follow-up visits as told. This is important. You may need to have an exam of the large intestine. This is called a colonoscopy. Contact a doctor if:  Your pain does not get better.  You have a hard time eating or drinking.  You are not pooping like normal. Get help right away if:  Your pain gets worse.  Your problems do not get better.  Your problems get worse very fast.  You have a fever.  You throw up (vomit) more than one time.  You have poop that is: ? Bloody. ? Black. ? Tarry. Summary  Diverticulitis is when small pockets in your large intestine (colon) get infected or swollen.  Take medicines only as told by your doctor.  Follow a diet as told by your doctor. This information is not intended to replace advice given to you by your health care provider. Make sure you  discuss any questions you have with your health care provider. Document Revised: 06/16/2017 Document Reviewed: 07/21/2016 Elsevier Patient Education  [place], 4-6 weeks Fiber is found in fruits, vegetables, whole grains, and beans. Eating a diet low in fiber helps to reduce how often you have bowel movements and how much you produce during a bowel movement. A low-fiber eating plan may help your digestive system heal if:  You have certain conditions, such as Crohn's disease or diverticulitis.  You recently had radiation therapy on your pelvis or bowel.  You recently had intestinal surgery.  You have a new surgical opening in your abdomen (colostomy or ileostomy).  Your intestine is narrowed (stricture). Your health care provider will determine how long you need to stay on this diet. Your health care provider may recommend that you work with a diet and nutrition specialist (dietitian). What are tips for following this plan? General guidelines  Follow recommendations from your dietitian about how much fiber you should have each day.  Most people on this eating plan should try to eat less than 10 grams (g) of fiber each day. Your daily fiber goal is _________________ g.  Take vitamin and mineral supplements as told by your health care provider or dietitian. Chewable or liquid forms are best when on this eating plan. Reading food labels  Check food labels for the amount of dietary fiber.  Choose foods that have less than 2 grams of fiber in  one serving. Cooking  Use white flour and other allowed grains for baking and cooking.  Cook meat using methods that keep it tender, such as braising or poaching.  Cook eggs until the yolk is completely solid.  Cook with healthy oils, such as olive oil or canola oil. Meal planning   Eat 5-6 small meals throughout the day instead of 3 large meals.  If you are lactose intolerant: ? Choose low-lactose dairy  foods. ? Do not eat dairy foods, if told by your dietitian.  Limit fat and oils to less than 8 teaspoons a day.  Eat small portions of desserts. What foods are allowed? The items listed below may not be a complete list. Talk with your dietitian about what dietary choices are best for you. Grains All bread and crackers made with white flour. Waffles, pancakes, and [place] toast. Bagels. Pretzels. Melba toast, zwieback, and matzoh. Cooked and dried cereals that do not contain whole grains, added fiber, seeds, or dried fruit. CornmealDomenick Gong. Hot and cold cereals made with refined corn, wheat, rice, or oats. Plain pasta and noodles. White rice. Vegetables Well-cooked or canned vegetables without skin, seeds, or stems. Cooked potatoes without skins. Vegetable juice. Fruits Soft-cooked or canned fruits without skin and seeds. Peeled ripe banana. Applesauce. Fruit juice without pulp. Meats and other protein foods Ground meat. Tender cuts of meat or poultry. Eggs. Fish, seafood, and shellfish. Smooth nut butters. Tofu. Dairy All milk products and drinks. Lactose-free milks, including rice, soy, and almond milks. Yogurt without fruit, nuts, chocolate, or granola mix-ins. Sour cream. Cottage cheese. Cheese. Beverages Decaf coffee. Fruit and vegetable juices or smoothies (in small amounts, with no pulp or skins, and with fruits from allowed list). Sports drinks. Herbal tea. Fats and oils Olive oil, canola oil, sunflower oil, flaxseed oil, and grapeseed oil. Mayonnaise. Cream cheese. Margarine. Butter. Sweets and desserts Plain cakes and cookies. Cream pies and pies made with allowed fruits. Pudding. Custard. Fruit gelatin. Sherbet. Popsicles. Ice cream without nuts. Plain hard candy. Honey. Jelly. Molasses. Syrups, including chocolate syrup. Chocolate. Marshmallows. Gumdrops. Seasoning and other foods Bouillon. Broth. Cream soups made from allowed foods. Strained soup. Casseroles made with allowed  foods. Ketchup. Mild mustard. Mild salad dressings. Plain gravies. Vinegar. Spices in moderation. Salt. Sugar. What foods are not allowed? The items listed below may not be a complete list. Talk with your dietitian about what dietary choices are best for you. Grains Whole wheat and whole grain breads and crackers. Multigrain breads and crackers. Rye bread. Whole grain or multigrain cereals. Cereals with nuts, raisins, or coconut. Bran. Coarse wheat cereals. Granola. High-fiber cereals. Cornmeal or corn bread. Whole grain pasta. Wild or brown rice. Quinoa. Popcorn. Buckwheat. Wheat germ. Vegetables Potato skins. Raw or undercooked vegetables. All beans and bean sprouts. Cooked greens. Corn. Peas. Cabbage. Beets. Broccoli. Brussels sprouts. Cauliflower. Mushrooms. Onions. Peppers. Parsnips. Okra. Sauerkraut. Fruit Raw or dried fruit. Berries. Fruit juice with pulp. Prune juice. Meats and other protein foods Tough, fibrous meats with gristle. Fatty meat. Poultry with skin. Fried meat, Sales executive, or fish. Deli or lunch meats. Sausage, bacon, and hot dogs. Nuts and chunky nut butter. Dried peas, beans, and lentils. Dairy Yogurt with fruit, nuts, chocolate, or granola mix-ins. Beverages Caffeinated coffee and teas. Fats and oils Avocado. Coconut. Sweets and desserts Desserts, cookies, or candies that contain nuts or coconut. Dried fruit. Jams and preserves with seeds. Marmalade. Any dessert made with fruits or grains that are not allowed. Seasoning and other foods Corn  tortilla chips. Soups made with vegetables or grains that are not allowed. Relish. Horseradish. Angie Fava. Olives. Summary  Most people on a low-fiber eating plan should eat less than 10 grams of fiber a day. Follow recommendations from your dietitian about how much fiber you should have each day.  Always check food labels to see the dietary fiber content of packaged foods. In general, a low-fiber food will have fewer than 2 grams of  fiber per serving.  In general, try to avoid whole grains, raw fruits and vegetables, dried fruit, tough cuts of meat, nuts, and seeds.  Take a vitamin and mineral supplement as told by your health care provider or dietitian. This information is not intended to replace advice given to you by your health care provider. Make sure you discuss any questions you have with your health care provider. Document Revised: 10/26/2018 Document Reviewed: 09/06/2016 Elsevier Patient Education  [place].   High-Fiber Diet Fiber, also called dietary fiber, is a type of carbohydrate that is found in fruits, vegetables, whole grains, and beans. A high-fiber diet can have many health benefits. Your health care provider may recommend a high-fiber diet to help:  Prevent constipation. Fiber can make your bowel movements more regular.  Lower your cholesterol.  Relieve the following conditions: ? Swelling of veins in the anus (hemorrhoids). ? Swelling and irritation (inflammation) of specific areas of the digestive tract (uncomplicated diverticulosis). ? A problem of the large intestine (colon) that sometimes causes pain and diarrhea (irritable bowel syndrome, IBS).  Prevent overeating as part of a weight-loss plan.  Prevent heart disease, type 2 diabetes, and certain cancers. What is my plan? The recommended daily fiber intake in grams (g) includes:  38 g for men age 55 or younger.  30 g for men over age 88.  81 g for women age 45 or younger.  21 g for women over age 59. You can get the recommended daily intake of dietary fiber by:  Eating a variety of fruits, vegetables, grains, and beans.  Taking a fiber supplement, if it is not possible to get enough fiber through your diet. What do I need to know about a high-fiber diet?  It is better to get fiber through food sources rather than from fiber supplements. There is not a lot of research about how effective supplements are.  Always  check the fiber content on the nutrition facts label of any prepackaged food. Look for foods that contain 5 g of fiber or more per serving.  Talk with a diet and nutrition specialist (dietitian) if you have questions about specific foods that are recommended or not recommended for your medical condition, especially if those foods are not listed below.  Gradually increase how much fiber you consume. If you increase your intake of dietary fiber too quickly, you may have bloating, cramping, or gas.  Drink plenty of water. Water helps you to digest fiber. What are tips for following this plan?  Eat a wide variety of high-fiber foods.  Make sure that half of the grains that you eat each day are whole grains.  Eat breads and cereals that are made with whole-grain flour instead of refined flour or white flour.  Eat brown rice, bulgur wheat, or millet instead of white rice.  Start the day with a breakfast that is high in fiber, such as a cereal that contains 5 g of fiber or more per serving.  Use beans in place of meat in soups, salads, and pasta  dishes.  Eat high-fiber snacks, such as berries, raw vegetables, nuts, and popcorn.  Choose whole fruits and vegetables instead of processed forms like juice or sauce. What foods can I eat?  Fruits Berries. Pears. Apples. Oranges. Avocado. Prunes and raisins. Dried figs. Vegetables Sweet potatoes. Spinach. Kale. Artichokes. Cabbage. Broccoli. Cauliflower. Green peas. Carrots. Squash. Grains Whole-grain breads. Multigrain cereal. Oats and oatmeal. Brown rice. Barley. Bulgur wheat. Millet. Quinoa. Bran muffins. Popcorn. Rye wafer crackers. Meats and other proteins Navy, kidney, and pinto beans. Soybeans. Split peas. Lentils. Nuts and seeds. Dairy Fiber-fortified yogurt. Beverages Fiber-fortified soy milk. Fiber-fortified orange juice. Other foods Fiber bars. The items listed above may not be a complete list of recommended foods and beverages.  Contact a dietitian for more options. What foods are not recommended? Fruits Fruit juice. Cooked, strained fruit. Vegetables Fried potatoes. Canned vegetables. Well-cooked vegetables. Grains White bread. Pasta made with refined flour. White rice. Meats and other proteins Fatty cuts of meat. Fried chicken or fried fish. Dairy Milk. Yogurt. Cream cheese. Sour cream. Fats and oils Butters. Beverages Soft drinks. Other foods Cakes and pastries. The items listed above may not be a complete list of foods and beverages to avoid. Contact a dietitian for more information. Summary  Fiber is a type of carbohydrate. It is found in fruits, vegetables, whole grains, and beans.  There are many health benefits of eating a high-fiber diet, such as preventing constipation, lowering blood cholesterol, helping with weight loss, and reducing your risk of heart disease, diabetes, and certain cancers.  Gradually increase your intake of fiber. Increasing too fast can result in cramping, bloating, and gas. Drink plenty of water while you increase your fiber.  The best sources of fiber include whole fruits and vegetables, whole grains, nuts, seeds, and beans. This information is not intended to replace advice given to you by your health care provider. Make sure you discuss any questions you have with your health care provider. Document Revised: 05/08/2017 Document Reviewed: 05/08/2017 Elsevier Patient Education  2020 [employer].

## 2020-03-06 NOTE — Plan of Care (Signed)
Nutrition Education Note  RD consulted for nutrition education regarding nutrition management for diverticulosis/ Diverticulitis.    RD provided "Fiber Restricted Nutrition Therapy" handout as well as "5 Sample Menus for Gradually Increasing Fiber" handout from the Academy of Nutrition and Dietetics. Reviewed patient's dietary recall and discussed ways for pt to meet nutrition goals over the next several weeks. Explained reasons for pt to follow a low fiber diet over the next 4-6 weeks. Reviewed low fiber foods and high fiber foods. Discussed best practice for long term management of diverticulosis is a high fiber diet and discussed ways to gradually increase fiber in the diet.   Teach back method used. Pt verbalizes understanding of information provided.   Expect good compliance.  Body mass index is Body mass index is 54.39 kg/m.Marland Kitchen Pt meets criteria for extreme obesity, class III based on current BMI.  Current diet order is full liquid, patient is consuming approximately 100% of meals at this time. Labs and medications reviewed. No further nutrition interventions warranted at this time. RD contact information provided. If additional nutrition issues arise, please re-consult RD.  Levada Schilling, RD, LDN, CDCES Registered Dietitian II Certified Diabetes Care and Education Specialist Please refer to [facility] for RD and/or RD on-call/weekend/after hours pager

## 2020-03-06 NOTE — Progress Notes (Signed)
Patient ID: Paula Fitzgerald, female   DOB: 13-Nov-1975, 44 y.o.   MRN: [id number]       Subjective: Doing much better today.  Had episode of painful gas yesterday, but resolved after a large belch.  Tolerating CLD with significant improvement in her symptoms and pain.  ROS: See above, otherwise other systems negative  Objective: Vital signs in last 24 hours: Temp:  [98.2 F (36.8 C)-98.7 F (37.1 C)] 98.5 F (36.9 C) (08/20 0859) Pulse Rate:  [67-77] 67 (08/20 0859) Resp:  [15-18] 18 (08/20 0859) BP: (124-144)/(58-92) 136/92 (08/20 0859) SpO2:  [92 %-100 %] 95 % (08/20 0859) Weight:  [142.6 kg] 142.6 kg (08/19 1449)    Intake/Output from previous day: 08/19 0701 - 08/20 0700 In: 771.8 [P.O.:400; IV Piggyback:371.8] Out: -  Intake/Output this shift: No intake/output data recorded.  PE: Gen: NAD, ambulating around the room Abd: soft, obese, NT, ND  Lab Results:  Recent Labs    03/05/20 0254 03/06/20 0418  WBC 7.8 6.1  HGB 11.6* 11.7*  HCT 33.7* 34.1*  PLT 219 242   BMET Recent Labs    03/05/20 0254 03/06/20 0418  NA 136 135  K 3.3* 3.2*  CL 102 102  CO2 23 26  GLUCOSE 77 115*  BUN 6 5*  CREATININE 1.00 0.93  CALCIUM 8.5* 8.3*   PT/INR No results for input(s): LABPROT, INR in the last 72 hours. CMP     Component Value Date/Time   NA 135 03/06/2020 0418   K 3.2 (L) 03/06/2020 0418   CL 102 03/06/2020 0418   CO2 26 03/06/2020 0418   GLUCOSE 115 (H) 03/06/2020 0418   BUN 5 (L) 03/06/2020 0418   CREATININE 0.93 03/06/2020 0418   CALCIUM 8.3 (L) 03/06/2020 0418   PROT 6.9 10/11/2013 0821   ALBUMIN 3.6 10/11/2013 0821   AST 16 10/11/2013 0821   ALT 14 10/11/2013 0821   ALKPHOS 74 10/11/2013 0821   BILITOT 0.4 10/11/2013 0821   GFRNONAA >60 03/06/2020 0418   GFRAA >60 03/06/2020 0418   Lipase     Component Value Date/Time   LIPASE 32 10/11/2013 0821       Studies/Results: No results found.  Anti-infectives: Anti-infectives  (From admission, onward)   Start     Dose/Rate Route Frequency Ordered Stop   03/03/20 0730  piperacillin-tazobactam (ZOSYN) IVPB 3.375 g        3.375 g 12.5 mL/hr over 240 Minutes Intravenous Every 8 hours 03/03/20 0724     03/03/20 0400  piperacillin-tazobactam (ZOSYN) IVPB 3.375 g        3.375 g 100 mL/hr over 30 Minutes Intravenous  Once 03/03/20 0354 03/03/20 0724       Assessment/Plan HTN - home meds Hypokalemia - replace K today, check BMET in am  Perforated sigmoid diverticulitis -significantly improved.  Pain is resolved. -adv to FLD -cont zosyn today, but can transition to oral abx tomorrow.  WBC normal at 6 -discussed diet at time of discharge and transition to high fiber diet.  Dietitian consult for diet education -will need GI follow up for c-scope in 6-8 weeks -may be able to DC home tomorrow if does well with her diet  FEN - FLD, SLIV VTE - lovenox ID - zosyn 8/17 -->   LOS: 3 days    Letha Cape , [facility] 03/06/2020, 9:42 AM Please see Amion for pager number during day hours 7:00am-4:30pm or 7:00am -11:30am on weekends

## 2020-03-07 LAB — BASIC METABOLIC PANEL
Anion gap: 10 (ref 5–15)
BUN: <5 mg/dL — ABNORMAL LOW (ref 6–20)
CO2: 25 mmol/L (ref 22–32)
Calcium: 8.7 mg/dL — ABNORMAL LOW (ref 8.9–10.3)
Chloride: 104 mmol/L (ref 98–111)
Creatinine, Ser: 0.98 mg/dL (ref 0.44–1.00)
GFR calc Af Amer: >60 mL/min (ref >60)
GFR calc non Af Amer: >60 mL/min (ref >60)
Glucose, Bld: 106 mg/dL — ABNORMAL HIGH (ref 70–99)
Potassium: 3.9 mmol/L (ref 3.5–5.1)
Sodium: 139 mmol/L (ref 135–145)

## 2020-03-07 MED ORDER — AMOXICILLIN-POT CLAVULANATE 875-125 MG PO TABS: 1.0000 | ORAL_TABLET | Freq: Two times a day (BID) | ORAL | 0 refills | Status: AC

## 2020-03-07 MED ORDER — POLYETHYLENE GLYCOL 3350 17 G PO PACK: 17.0000 g | PACK | Freq: Every day | ORAL | 0 refills | Status: AC | PRN

## 2020-03-07 NOTE — Plan of Care (Signed)
  Problem: Pain Managment: Goal: General experience of comfort will improve Outcome: Progressing   Problem: Safety: Goal: Ability to remain free from injury will improve Outcome: Progressing   Problem: Skin Integrity: Goal: Risk for impaired skin integrity will decrease Outcome: Progressing   

## 2020-03-07 NOTE — Discharge Summary (Signed)
[facility] Discharge Summary   Patient ID: NHYLA NAPPI MRN: [id number] DOB/AGE: [birth year] 46 y.o.  Admit date: 03/03/2020 Discharge date: 03/07/2020  Admitting Diagnosis: Acute sigmoid diverticulitis with microperforation  Discharge Diagnosis Patient Active Problem List   Diagnosis Date Noted  . Diverticulitis of colon with perforation 03/03/2020  . Severe uncontrolled hypertension 07/11/2011  . Morbid obesity (HCC) 07/11/2011  . PNA (pneumonia) 07/04/2011  . OBSTRUCTIVE SLEEP APNEA 01/22/2010  . BIPOLAR DISORDER UNSPECIFIED 12/23/2009  . HYPERTENSION 12/23/2009  . ASTHMA 12/23/2009    Consultants None  Imaging: CT RENAL STONE STUDY 03/03/20  IMPRESSION: 1. Acute diverticulitis of the proximal sigmoid colon. There is an inflamed large diverticulum. There is no regional free air or abscess, however there is evidence of perforation with small foci of free air in the upper abdomen. 2. No renal stones or obstructive uropathy. 3. Mild hepatic steatosis.  Procedures None  Hospital Course:  44 y/o F with PMH HTN who presented with a cc acute onset periumbilical and left-sided abdominal pain without associated sxs. CT abd (above) revealed likely perforated diverticulitis of the sigmoid. Vitals and WBC were WNL. Patient admitted and started on bowel rest and IV abx. Diet advanced as pain improved. On 03/08/11 vitals were stable, pain improved, tolerating PO, and felt stable for D/C home. She will require outpatient F/I with GI for colonoscpy in 6-8 weeks.  Physical Exam: General:  Alert, NAD, pleasant, comfortable Abd:  Soft, mild central abdominal tenderness, no lower abdominal tenderness, no guarding, NT/ND, +BS   Allergies as of 03/07/2020      Reactions   Other Hives, Rash, Other (See Comments)   Bell Pepper- Rash Unnamed B/P med- Hives      Medication List    STOP taking these medications   amoxicillin 500 MG capsule Commonly known as:  AMOXIL   predniSONE 50 MG tablet Commonly known as: DELTASONE     TAKE these medications   acetaminophen 500 MG tablet Commonly known as: TYLENOL Take 1,000 mg by mouth every 6 (six) hours as needed (for pain).   albuterol 108 (90 Base) MCG/ACT inhaler Commonly known as: VENTOLIN HFA Inhale 2 puffs into the lungs every 4 (four) hours as needed for wheezing or shortness of breath (or coughing).   amoxicillin-clavulanate 875-125 MG tablet Commonly known as: Augmentin Take 1 tablet by mouth 2 (two) times daily for 10 days.   hydrochlorothiazide 25 MG tablet Commonly known as: HYDRODIURIL Take 25 mg by mouth daily.   losartan 100 MG tablet Commonly known as: COZAAR Take 100 mg by mouth daily.   Pamprin Max 338-250-53 MG tablet Generic drug: aspirin-acetaminophen-caffeine Take 1 tablet by mouth every 6 (six) hours as needed (for pain).   polyethylene glycol 17 g packet Commonly known as: MIRALAX / GLYCOLAX Take 17 g by mouth daily as needed for mild constipation or moderate constipation.         Follow-up Information    Loyal Jacobson, MD Follow up.   Specialty: Family Medicine Why: as needed  Contact information: [address] [place] [address] [phone]        Gastroeneterology Follow up in 6 week(s).   Why: You will need to obtain a GI doctor to set up an appointment to discuss an colonoscopy for 6-8 weeks after discharge because of diverticulitis       Berna Bue, MD Follow up today.   Specialty: General Surgery Why: No definitive follow up needed, but you may  call if you have questions or concerns Contact information: [address] [place] [address] [phone]               Signed: Hosie Spangle, [facility] Surgery 03/07/2020, 9:44 AM

## 2020-03-07 NOTE — Progress Notes (Signed)
Pt tolerated a large breakfast well and wishes to go home before lunch. Pt AVS reviewed, all questions answered, IV removed, belongings gathered and pt dressed. Pt reports that her abdominal pain has ceased, she has no nausea or vomiting and she had a formed bowel movement after breakfast.

## 2020-03-07 NOTE — Plan of Care (Signed)
  Problem: Education: Goal: Knowledge of General Education information will improve Description Including pain rating scale, medication(s)/side effects and non-pharmacologic comfort measures Outcome: Progressing   Problem: Health Behavior/Discharge Planning: Goal: Ability to manage health-related needs will improve Outcome: Progressing   

## 2020-05-18 ENCOUNTER — Emergency Department (HOSPITAL_COMMUNITY)
Admission: EM | Admit: 2020-05-18 | Discharge: 2020-05-19 | Disposition: A | Discharge status: Home / Self Care | Payer: Medicaid Other | Attending: Emergency Medicine | Admitting: Emergency Medicine

## 2020-05-18 ENCOUNTER — Encounter (HOSPITAL_COMMUNITY): Payer: Self-pay | Admitting: Emergency Medicine

## 2020-05-18 ENCOUNTER — Other Ambulatory Visit: Payer: Self-pay

## 2020-05-18 ENCOUNTER — Emergency Department (HOSPITAL_COMMUNITY): Payer: Medicaid Other

## 2020-05-18 DIAGNOSIS — Z87891 Personal history of nicotine dependence: Secondary | ICD-10-CM | POA: Diagnosis not present

## 2020-05-18 DIAGNOSIS — R197 Diarrhea, unspecified: Secondary | ICD-10-CM | POA: Diagnosis not present

## 2020-05-18 DIAGNOSIS — R142 Eructation: Secondary | ICD-10-CM | POA: Insufficient documentation

## 2020-05-18 DIAGNOSIS — M549 Dorsalgia, unspecified: Secondary | ICD-10-CM | POA: Diagnosis not present

## 2020-05-18 DIAGNOSIS — J45909 Unspecified asthma, uncomplicated: Secondary | ICD-10-CM | POA: Diagnosis not present

## 2020-05-18 DIAGNOSIS — Z7982 Long term (current) use of aspirin: Secondary | ICD-10-CM | POA: Insufficient documentation

## 2020-05-18 DIAGNOSIS — I10 Essential (primary) hypertension: Secondary | ICD-10-CM | POA: Insufficient documentation

## 2020-05-18 DIAGNOSIS — Z79899 Other long term (current) drug therapy: Secondary | ICD-10-CM | POA: Diagnosis not present

## 2020-05-18 DIAGNOSIS — R110 Nausea: Secondary | ICD-10-CM | POA: Insufficient documentation

## 2020-05-18 DIAGNOSIS — R1011 Right upper quadrant pain: Secondary | ICD-10-CM | POA: Diagnosis not present

## 2020-05-18 LAB — CBC
HCT: 41.9 % (ref 36.0–46.0)
Hemoglobin: 13.5 g/dL (ref 12.0–15.0)
MCH: 30.3 pg (ref 26.0–34.0)
MCHC: 32.2 g/dL (ref 30.0–36.0)
MCV: 93.9 fL (ref 80.0–100.0)
Platelets: 312 10*3/uL (ref 150–400)
RBC: 4.46 MIL/uL (ref 3.87–5.11)
RDW: 14.3 % (ref 11.5–15.5)
WBC: 7.1 10*3/uL (ref 4.0–10.5)
nRBC: 0 % (ref 0.0–0.2)

## 2020-05-18 LAB — COMPREHENSIVE METABOLIC PANEL
ALT: 15 U/L (ref 0–44)
AST: 13 U/L — ABNORMAL LOW (ref 15–41)
Albumin: 3.6 g/dL (ref 3.5–5.0)
Alkaline Phosphatase: 66 U/L (ref 38–126)
Anion gap: 13 (ref 5–15)
BUN: 18 mg/dL (ref 6–20)
CO2: 25 mmol/L (ref 22–32)
Calcium: 9.3 mg/dL (ref 8.9–10.3)
Chloride: 101 mmol/L (ref 98–111)
Creatinine, Ser: 1.35 mg/dL — ABNORMAL HIGH (ref 0.44–1.00)
GFR, Estimated: 49 mL/min — ABNORMAL LOW (ref >60)
Glucose, Bld: 117 mg/dL — ABNORMAL HIGH (ref 70–99)
Potassium: 3.7 mmol/L (ref 3.5–5.1)
Sodium: 139 mmol/L (ref 135–145)
Total Bilirubin: 0.7 mg/dL (ref 0.3–1.2)
Total Protein: 7.1 g/dL (ref 6.5–8.1)

## 2020-05-18 LAB — LIPASE, BLOOD: Lipase: 58 U/L — ABNORMAL HIGH (ref 11–51)

## 2020-05-18 LAB — URINALYSIS, ROUTINE W REFLEX MICROSCOPIC
Bilirubin Urine: NEGATIVE
Glucose, UA: NEGATIVE mg/dL
Hgb urine dipstick: NEGATIVE
Ketones, ur: NEGATIVE mg/dL
Leukocytes,Ua: NEGATIVE
Nitrite: NEGATIVE
Protein, ur: NEGATIVE mg/dL
Specific Gravity, Urine: 1.026 (ref 1.005–1.030)
pH: 6 (ref 5.0–8.0)

## 2020-05-18 LAB — POC URINE PREG, ED: Preg Test, Ur: NEGATIVE

## 2020-05-18 MED ORDER — OXYCODONE-ACETAMINOPHEN 5-325 MG PO TABS
1.0000 | ORAL_TABLET | Freq: Once | ORAL | Status: AC
  Administered 2020-05-18: 1 via ORAL
  Filled 2020-05-18: qty 1

## 2020-05-18 NOTE — ED Triage Notes (Signed)
Pt c/o sudden onset low back pain radiating around to low R abdomen onset today @ 1500. Diarrhea x 3.  Pt tearful, yelling out at times.

## 2020-05-19 ENCOUNTER — Emergency Department (HOSPITAL_COMMUNITY): Payer: Medicaid Other

## 2020-05-19 MED ORDER — ONDANSETRON HCL 4 MG PO TABS: 4.0000 mg | ORAL_TABLET | Freq: Three times a day (TID) | ORAL | 0 refills | Status: DC | PRN

## 2020-05-19 MED ORDER — OXYCODONE-ACETAMINOPHEN 5-325 MG PO TABS: 1.0000 | ORAL_TABLET | ORAL | 0 refills | Status: DC | PRN

## 2020-05-19 MED ORDER — ONDANSETRON HCL 4 MG/2ML IJ SOLN
4.0000 mg | Freq: Once | INTRAMUSCULAR | Status: AC
  Administered 2020-05-19: 4 mg via INTRAVENOUS
  Filled 2020-05-19: qty 2

## 2020-05-19 MED ORDER — MORPHINE SULFATE (PF) 4 MG/ML IV SOLN
4.0000 mg | Freq: Once | INTRAVENOUS | Status: AC
  Administered 2020-05-19: 4 mg via INTRAVENOUS
  Filled 2020-05-19: qty 1

## 2020-05-19 NOTE — Discharge Instructions (Signed)
Your work-up today did not show subacute cholecystitis, abscess, obstruction, or diverticulitis however I do suspect either your gallbladder versus your GI tract was causing your pain.  As you responded well to pain medicine, please take the prescription for pain medicine and nausea medicine to help maintain hydration and help with your discomfort.  Please rest and stay hydrated.  Please follow-up with your primary doctor.  If any symptoms change or worsen, please return to the nearest emergency department.

## 2020-05-19 NOTE — ED Notes (Signed)
Pt resting in bed. NADN 

## 2020-05-19 NOTE — ED Notes (Signed)
First contact. Pt to ED stretcher. Report moderate ABD pain

## 2020-05-19 NOTE — ED Provider Notes (Signed)
[facility] EMERGENCY DEPARTMENT Provider Note   CSN: [id number] Arrival date & time: 05/18/20  2020     History Chief Complaint  Patient presents with  . Abdominal Pain    Paula Fitzgerald is a 44 y.o. female.  The history is provided by the patient and medical records. No language interpreter was used.  Abdominal Pain Pain location:  RUQ, RLQ and R flank Pain quality: aching and cramping   Pain radiates to:  Back and RUQ Pain severity:  Severe Onset quality:  Sudden Timing:  Constant Progression:  Unchanged Chronicity:  Recurrent Context: eating   Relieved by:  Nothing Worsened by:  Movement Ineffective treatments:  None tried Associated symptoms: belching, diarrhea and nausea   Associated symptoms: no anorexia, no chest pain, no chills, no constipation, no cough, no dysuria, no fatigue, no fever, no shortness of breath, no vaginal bleeding, no vaginal discharge and no vomiting   Risk factors: obesity        Past Medical History:  Diagnosis Date  . Asthma   . Bipolar 1 disorder (HCC)   . Depressed   . Diverticulitis 03/03/2020  . Hypertension   . Obesity   . Panic attacks     Patient Active Problem List   Diagnosis Date Noted  . Diverticulitis of colon with perforation 03/03/2020  . Severe uncontrolled hypertension 07/11/2011  . Morbid obesity (HCC) 07/11/2011  . PNA (pneumonia) 07/04/2011  . OBSTRUCTIVE SLEEP APNEA 01/22/2010  . BIPOLAR DISORDER UNSPECIFIED 12/23/2009  . HYPERTENSION 12/23/2009  . ASTHMA 12/23/2009    Past Surgical History:  Procedure Laterality Date  . TUBAL LIGATION       OB History   No obstetric history on file.     No family history on file.  Social History   Tobacco Use  . Smoking status: Former Smoker    Types: Cigarettes    Quit date: 10/02/2018    Years since quitting: 1.6  . Smokeless tobacco: Never Used  Vaping Use  . Vaping Use: Never used  Substance Use Topics  . Alcohol use:  Yes    Comment: sociallly  . Drug use: Yes    Frequency: 3.0 times per week    Types: Marijuana    Home Medications Prior to Admission medications   Medication Sig Start Date End Date Taking? Authorizing Provider  acetaminophen (TYLENOL) 500 MG tablet Take 1,000 mg by mouth every 6 (six) hours as needed (for pain).    [provider]  albuterol (PROVENTIL HFA;VENTOLIN HFA) 108 (90 BASE) MCG/ACT inhaler Inhale 2 puffs into the lungs every 4 (four) hours as needed for wheezing or shortness of breath (or coughing). Patient not taking: Reported on 03/03/2020 04/10/15   [name], MD  aspirin-acetaminophen-caffeine (PAMPRIN MAX) [phone] MG tablet Take 1 tablet by mouth every 6 (six) hours as needed (for pain).    [provider]  hydrochlorothiazide (HYDRODIURIL) 25 MG tablet Take 25 mg by mouth daily. 08/03/17   [provider]  losartan (COZAAR) 100 MG tablet Take 100 mg by mouth daily. 08/03/17   [provider]  polyethylene glycol (MIRALAX / GLYCOLAX) 17 g packet Take 17 g by mouth daily as needed for mild constipation or moderate constipation. 03/07/20   Adam Phenix, PA-C    Allergies    Other  Review of Systems   Review of Systems  Constitutional: Negative for chills, diaphoresis, fatigue and fever.  HENT: Negative for congestion.   Respiratory: Negative for cough,  chest tightness, shortness of breath and wheezing.   Cardiovascular: Negative for chest pain, palpitations and leg swelling.  Gastrointestinal: Positive for abdominal pain, diarrhea and nausea. Negative for abdominal distention, anorexia, constipation and vomiting.  Genitourinary: Positive for flank pain. Negative for dysuria, vaginal bleeding and vaginal discharge.  Musculoskeletal: Positive for back pain. Negative for neck pain and neck stiffness.  Skin: Negative for rash and wound.  Neurological: Negative for light-headedness and headaches.  Psychiatric/Behavioral: Negative  for agitation.  All other systems reviewed and are negative.   Physical Exam Updated Vital Signs BP 125/69 (BP Location: Right Arm)   Pulse 79   Temp 98.6 F (37 C) (Oral)   Resp 17   LMP 05/08/2020   SpO2 99%   Physical Exam Vitals and nursing note reviewed.  Constitutional:      General: She is not in acute distress.    Appearance: She is well-developed. She is not ill-appearing, toxic-appearing or diaphoretic.  HENT:     Head: Normocephalic and atraumatic.     Right Ear: External ear normal.     Left Ear: External ear normal.     Nose: Nose normal.     Mouth/Throat:     Pharynx: No oropharyngeal exudate.  Eyes:     General: No scleral icterus.    Extraocular Movements: Extraocular movements intact.     Conjunctiva/sclera: Conjunctivae normal.     Pupils: Pupils are equal, round, and reactive to light.  Cardiovascular:     Rate and Rhythm: Normal rate.     Heart sounds: Normal heart sounds. No murmur heard.   Pulmonary:     Effort: No respiratory distress.     Breath sounds: No stridor. No wheezing, rhonchi or rales.  Chest:     Chest wall: No tenderness.  Abdominal:     General: Abdomen is flat. Bowel sounds are normal. There is no distension.     Palpations: Abdomen is soft.     Tenderness: There is abdominal tenderness in the right upper quadrant, right lower quadrant and epigastric area. There is no right CVA tenderness, left CVA tenderness, guarding or rebound.  Musculoskeletal:     Cervical back: Normal range of motion and neck supple.  Skin:    General: Skin is warm.     Findings: No erythema or rash.  Neurological:     General: No focal deficit present.     Mental Status: She is alert and oriented to person, place, and time.     Motor: No abnormal muscle tone.     Coordination: Coordination normal.     Deep Tendon Reflexes: Reflexes are normal and symmetric.  Psychiatric:        Mood and Affect: Mood normal.     ED Results / Procedures /  Treatments   Labs (all labs ordered are listed, but only abnormal results are displayed) Labs Reviewed  LIPASE, BLOOD - Abnormal; Notable for the following components:      Result Value   Lipase 58 (*)    All other components within normal limits  COMPREHENSIVE METABOLIC PANEL - Abnormal; Notable for the following components:   Glucose, Bld 117 (*)    Creatinine, Ser 1.35 (*)    AST 13 (*)    GFR, Estimated 49 (*)    All other components within normal limits  URINALYSIS, ROUTINE W REFLEX MICROSCOPIC - Abnormal; Notable for the following components:   APPearance HAZY (*)    All other components within normal limits  CBC  I-STAT BETA HCG BLOOD, ED ([facility], [facility], [facility] ONLY)  POC URINE PREG, ED    EKG None  Radiology CT Renal Stone Study  Result Date: 05/18/2020 CLINICAL DATA:  Right flank pain.  Kidney stone suspected. EXAM: CT ABDOMEN AND PELVIS WITHOUT CONTRAST TECHNIQUE: Multidetector CT imaging of the abdomen and pelvis was performed following the standard protocol without IV contrast. COMPARISON:  Renal protocol CT 03/03/2020 FINDINGS: Lower chest: Lung bases are clear Hepatobiliary: Prominent liver spanning 18 cm cranial caudal. Similar low-density lesion involving the left lobe from prior exam, too small to characterize. Unremarkable gallbladder. No calcified gallstone. No pericholecystic inflammation. Pancreas: No ductal dilatation or inflammation. Spleen: Normal in size without focal abnormality. Adrenals/Urinary Tract: Normal adrenal glands. No renal calculi. No hydronephrosis. No perinephric edema. Both ureters are decompressed without stones along the course. Urinary bladder partially distended. No bladder stone or wall thickening. Stomach/Bowel: The stomach is distended with ingested material. There is no gastric wall thickening. Normal positioning of the duodenum and ligament of Treitz. No small bowel obstruction or inflammatory change. Normal appendix. Colonic diverticulosis without  evidence of diverticulitis or acute inflammation. Normal scarring adjacent to the previously inflamed diverticulum in the sigmoid colon. No colonic wall thickening or pericolonic edema. Vascular/Lymphatic: Normal caliber abdominal aorta. Trace aortic atherosclerosis. No abdominopelvic adenopathy. Reproductive: Uterus and bilateral adnexa are unremarkable. Other: No free air or free fluid. Free air on prior exam has resolved. Calcification in the right pelvis is unchanged from prior exam and represents a phlebolith. Tiny fat containing umbilical hernia. Musculoskeletal: There are no acute or suspicious osseous abnormalities. No muscular findings to explain flank pain. IMPRESSION: 1. No renal stones or obstructive uropathy. No acute abnormality in the abdomen/pelvis. 2. Colonic diverticulosis without acute inflammation. Aortic Atherosclerosis (ICD10-I70.0). Electronically Signed   By: Narda RutherfordMelanie  Sanford M.D.   On: 05/18/2020 23:34   US Abdomen Limited RUQ (LIVER/GB)  Result Date: 05/19/2020 CLINICAL DATA:  Right upper quadrant pain over the last day. EXAM: ULTRASOUND ABDOMEN LIMITED RIGHT UPPER QUADRANT COMPARISON:  10/05/2009 FINDINGS: Gallbladder: No gallstones or wall thickening visualized. No sonographic Murphy sign noted by sonographer. Common bile duct: Diameter: 3.9 mm, normal Liver: Normal echogenicity. Incidental 1.4 cm cyst in the left lobe. Portal vein is patent on color Doppler imaging with normal direction of blood flow towards the liver. Other: None. IMPRESSION: Normal examination. No abnormality seen to explain right upper quadrant pain. Electronically Signed   By: Paulina FusiMark  Shogry M.D.   On: 05/19/2020 10:21    Procedures Procedures (including critical care time)  Medications Ordered in ED Medications  oxyCODONE-acetaminophen (PERCOCET/ROXICET) 5-325 MG per tablet 1 tablet (1 tablet Oral Given 05/18/20 2106)  ondansetron (ZOFRAN) injection 4 mg (4 mg Intravenous Given 05/19/20 0835)  morphine 4  MG/ML injection 4 mg (4 mg Intravenous Given 05/19/20 0835)    ED Course  I have reviewed the triage vital signs and the nursing notes.  Pertinent labs & imaging results that were available during my care of the patient were reviewed by me and considered in my medical decision making (see chart for details).    MDM Rules/Calculators/A&P                          Paula Fitzgerald is a 44 y.o. female with a past medical history significant for hypertension, sleep apnea, asthma, prior diverticulitis with perforation, bipolar disorder, and prior gallstones who presents with abdominal pain.  Patient reports that since yesterday, patient  has been having significant pain in her right abdomen primarily in the right upper quadrant, right lower quadrant, and radiating around towards her back.  She reports the pain is severe and started not long after eating a salad yesterday.  Of note, patient has been in the emergency department approximately 11 hours and 45 minutes prior to my initial evaluation.  She reports some diarrhea and then some nausea but did not vomit.  She says this feels different than the prior diverticulitis she has had in the past.  She denies any trauma.  She denies fevers, chills, urinary symptoms.  She denies any chest pain, shortness of breath, or cough.  She says that years ago, she was told she needed her gallbladder taken out but initially refused and never had further symptoms.  She says that the pain in the right upper quadrant does feel similar to how it did back then.  She reports the pain is currently severe.  On exam, patient did have tenderness in her epigastric and right upper quadrant.  There is also some tenderness in her right mid abdomen going around towards her right flank.  There is no rash to suggest shingles.  No CVA tenderness.  No midline back tenderness.  No lacerations.  Lungs clear and chest nontender.  Good pulses in extremities.  No leg tenderness or leg  swelling.  Patient had labs and some imaging done in triage.  Patient had a CT abdomen pelvis which did not show evidence of kidney stone, diverticulitis, or appendicitis.  It did not comment on any gallbladder etiology however the patient is still having pain in that location.  Patient's other labs also were somewhat reassuring.  Patient had very mild elevation of lipase up to 58 but doubt acute pancreatitis.  CBC reassuring.  Metabolic panel showed likely mild dehydration with creatinine up to 1.35.  Urinalysis does not show evidence of infection or bleeding.  She is not pregnant.  Given the patient's continued pain in the right upper quadrant despite reassuring CT, will get a rapid ultrasound to look for concern for acute cholecystitis with her known stone history.  We also discussed this could be either irritation from the salad causing diarrhea and pain in her already sensitive GI tract from the prior diverticulitis versus musculoskeletal pain.  If work-up is reassuring, anticipate discharge home.  1:56 PM Patient's ultrasound not show acute cholecystitis or other gallbladder abnormality.  Patient likely has symptoms from abdominal wall pain versus abdominal pain related to eating yesterday.  She was p.o. challenged and was able to do better and did better with pain medicine.  Will prescribe her some pain medicine nausea medicine and allow her to go home.  Patient understands return precautions follow-up instructions.  Patient discharged in good condition improving symptoms.   Final Clinical Impression(s) / ED Diagnoses Final diagnoses:  RUQ abdominal pain  Nausea    Rx / DC Orders ED Discharge Orders         Ordered    oxyCODONE-acetaminophen (PERCOCET/ROXICET) 5-325 MG tablet  Every 4 hours PRN        05/19/20 1359    ondansetron (ZOFRAN) 4 MG tablet  Every 8 hours PRN        05/19/20 1359          Clinical Impression: 1. Nausea   2. RUQ abdominal pain     Disposition:  Discharge  Condition: Good  I have discussed the results, Dx and Tx plan with the pt(& family  if present). He/she/they expressed understanding and agree(s) with the plan. Discharge instructions discussed at great length. Strict return precautions discussed and pt &/or family have verbalized understanding of the instructions. No further questions at time of discharge.    New Prescriptions   ONDANSETRON (ZOFRAN) 4 MG TABLET    Take 1 tablet (4 mg total) by mouth every 8 (eight) hours as needed for nausea or vomiting.   OXYCODONE-ACETAMINOPHEN (PERCOCET/ROXICET) 5-325 MG TABLET    Take 1 tablet by mouth every 4 (four) hours as needed for severe pain.    Follow Up: No follow-up provider specified.    Aleck Locklin, Canary Brim, MD 05/19/20 1400

## 2020-09-28 DIAGNOSIS — K219 Gastro-esophageal reflux disease without esophagitis: Secondary | ICD-10-CM | POA: Insufficient documentation

## 2020-09-28 DIAGNOSIS — M545 Low back pain, unspecified: Secondary | ICD-10-CM | POA: Insufficient documentation

## 2020-10-04 ENCOUNTER — Emergency Department (HOSPITAL_COMMUNITY): Payer: Medicaid Other

## 2020-10-04 ENCOUNTER — Encounter (HOSPITAL_COMMUNITY): Payer: Self-pay | Admitting: Emergency Medicine

## 2020-10-04 ENCOUNTER — Emergency Department (HOSPITAL_COMMUNITY)
Admission: EM | Admit: 2020-10-04 | Discharge: 2020-10-04 | Disposition: A | Discharge status: Home / Self Care | Payer: Medicaid Other | Attending: Emergency Medicine | Admitting: Emergency Medicine

## 2020-10-04 ENCOUNTER — Other Ambulatory Visit: Payer: Self-pay

## 2020-10-04 DIAGNOSIS — Z87891 Personal history of nicotine dependence: Secondary | ICD-10-CM | POA: Insufficient documentation

## 2020-10-04 DIAGNOSIS — K5730 Diverticulosis of large intestine without perforation or abscess without bleeding: Secondary | ICD-10-CM | POA: Insufficient documentation

## 2020-10-04 DIAGNOSIS — R748 Abnormal levels of other serum enzymes: Secondary | ICD-10-CM | POA: Diagnosis not present

## 2020-10-04 DIAGNOSIS — R1084 Generalized abdominal pain: Secondary | ICD-10-CM | POA: Diagnosis present

## 2020-10-04 DIAGNOSIS — J45909 Unspecified asthma, uncomplicated: Secondary | ICD-10-CM | POA: Diagnosis not present

## 2020-10-04 DIAGNOSIS — Z79899 Other long term (current) drug therapy: Secondary | ICD-10-CM | POA: Diagnosis not present

## 2020-10-04 DIAGNOSIS — Z7982 Long term (current) use of aspirin: Secondary | ICD-10-CM | POA: Diagnosis not present

## 2020-10-04 DIAGNOSIS — K5792 Diverticulitis of intestine, part unspecified, without perforation or abscess without bleeding: Secondary | ICD-10-CM | POA: Diagnosis not present

## 2020-10-04 DIAGNOSIS — I10 Essential (primary) hypertension: Secondary | ICD-10-CM | POA: Insufficient documentation

## 2020-10-04 LAB — COMPREHENSIVE METABOLIC PANEL
ALT: 13 U/L (ref 0–44)
AST: 13 U/L — ABNORMAL LOW (ref 15–41)
Albumin: 3.2 g/dL — ABNORMAL LOW (ref 3.5–5.0)
Alkaline Phosphatase: 58 U/L (ref 38–126)
Anion gap: 7 (ref 5–15)
BUN: 11 mg/dL (ref 6–20)
CO2: 25 mmol/L (ref 22–32)
Calcium: 8.6 mg/dL — ABNORMAL LOW (ref 8.9–10.3)
Chloride: 107 mmol/L (ref 98–111)
Creatinine, Ser: 0.85 mg/dL (ref 0.44–1.00)
GFR, Estimated: >60 mL/min (ref >60)
Glucose, Bld: 117 mg/dL — ABNORMAL HIGH (ref 70–99)
Potassium: 3.8 mmol/L (ref 3.5–5.1)
Sodium: 139 mmol/L (ref 135–145)
Total Bilirubin: 0.8 mg/dL (ref 0.3–1.2)
Total Protein: 6.5 g/dL (ref 6.5–8.1)

## 2020-10-04 LAB — CBC
HCT: 37 % (ref 36.0–46.0)
Hemoglobin: 12.3 g/dL (ref 12.0–15.0)
MCH: 30.5 pg (ref 26.0–34.0)
MCHC: 33.2 g/dL (ref 30.0–36.0)
MCV: 91.8 fL (ref 80.0–100.0)
Platelets: 263 10*3/uL (ref 150–400)
RBC: 4.03 MIL/uL (ref 3.87–5.11)
RDW: 15.1 % (ref 11.5–15.5)
WBC: 6.4 10*3/uL (ref 4.0–10.5)
nRBC: 0 % (ref 0.0–0.2)

## 2020-10-04 LAB — LIPASE, BLOOD: Lipase: 205 U/L — ABNORMAL HIGH (ref 11–51)

## 2020-10-04 LAB — I-STAT BETA HCG BLOOD, ED (MC, WL, AP ONLY): I-stat hCG, quantitative: <5 m[IU]/mL (ref <5)

## 2020-10-04 MED ORDER — SODIUM CHLORIDE 0.9 % IV BOLUS
1000.0000 mL | Freq: Once | INTRAVENOUS | Status: AC
  Administered 2020-10-04: 1000 mL via INTRAVENOUS

## 2020-10-04 MED ORDER — ONDANSETRON HCL 4 MG PO TABS: 4.0000 mg | ORAL_TABLET | Freq: Three times a day (TID) | ORAL | 0 refills | Status: DC | PRN

## 2020-10-04 MED ORDER — ONDANSETRON HCL 4 MG/2ML IJ SOLN
4.0000 mg | Freq: Once | INTRAMUSCULAR | Status: AC
  Administered 2020-10-04: 4 mg via INTRAVENOUS
  Filled 2020-10-04: qty 2

## 2020-10-04 MED ORDER — AMOXICILLIN-POT CLAVULANATE 875-125 MG PO TABS: 1.0000 | ORAL_TABLET | Freq: Two times a day (BID) | ORAL | 0 refills | Status: DC

## 2020-10-04 MED ORDER — IOHEXOL 300 MG/ML  SOLN
100.0000 mL | Freq: Once | INTRAMUSCULAR | Status: AC | PRN
  Administered 2020-10-04: 100 mL via INTRAVENOUS

## 2020-10-04 MED ORDER — OXYCODONE-ACETAMINOPHEN 5-325 MG PO TABS: 1.0000 | ORAL_TABLET | Freq: Four times a day (QID) | ORAL | 0 refills | Status: DC | PRN

## 2020-10-04 MED ORDER — MORPHINE SULFATE (PF) 4 MG/ML IV SOLN
4.0000 mg | Freq: Once | INTRAVENOUS | Status: AC
  Administered 2020-10-04: 4 mg via INTRAVENOUS
  Filled 2020-10-04: qty 1

## 2020-10-04 NOTE — Discharge Instructions (Signed)
You were seen in the emergency department for abdominal pain and back pain.  Your lab work showed your lipase to be elevated.  Your CAT scan showed acute diverticulitis.  We are treating you with antibiotics pain medication and nausea medication.  You should do a clear liquid diet and advance as tolerated.  Return to the emergency department if any fevers or worsening symptoms.

## 2020-10-04 NOTE — ED Triage Notes (Signed)
C/o lower abd pain, lower back pain, and rectal pain since Thursday.  Took Miralax on Friday.  Reports nausea and diarrhea yesterday.  States it feels the same as when she was diagnosed with diverticulitis.

## 2020-10-04 NOTE — ED Provider Notes (Signed)
[facility] EMERGENCY DEPARTMENT Provider Note   CSN: [id number] Arrival date & time: 10/04/20  1208     History Chief Complaint  Patient presents with  . Abdominal Pain    Paula Fitzgerald is a 45 y.o. female.  She has a history of asthma and hypertension.  She had an episode of diverticulitis last year.  She is complaining of 4 days of diffuse abdominal pain radiating through to her low back.  She also had an intense spasm in her rectum once although that is resolved.  She is tried some leftover Percocet and tried MiraLAX without any improvement in her symptoms.  No known fevers chills cough shortness of breath.  No diarrhea or constipation.  No blood in her bowel movements.  No urinary symptoms.  She has a prior history of a tubal ligation.  Rates the pain as 5 out of 10. The history is provided by the patient.  Abdominal Pain Pain location:  Generalized Pain quality: aching   Pain radiates to:  Back Pain severity:  Moderate Onset quality:  Gradual Duration:  4 days Timing:  Constant Progression:  Worsening Chronicity:  Recurrent Context: not recent travel and not trauma   Relieved by:  Nothing Worsened by:  Nothing Ineffective treatments:  Bowel activity Associated symptoms: nausea   Associated symptoms: no chest pain, no constipation, no cough, no diarrhea, no dysuria, no fever, no hematemesis, no hematochezia, no hematuria, no shortness of breath, no sore throat, no vaginal bleeding, no vaginal discharge and no vomiting   Risk factors: no alcohol abuse        Past Medical History:  Diagnosis Date  . Asthma   . Bipolar 1 disorder (HCC)   . Depressed   . Diverticulitis 03/03/2020  . Hypertension   . Obesity   . Panic attacks     Patient Active Problem List   Diagnosis Date Noted  . Diverticulitis of colon with perforation 03/03/2020  . Severe uncontrolled hypertension 07/11/2011  . Morbid obesity (HCC) 07/11/2011  . PNA (pneumonia)  07/04/2011  . OBSTRUCTIVE SLEEP APNEA 01/22/2010  . BIPOLAR DISORDER UNSPECIFIED 12/23/2009  . HYPERTENSION 12/23/2009  . ASTHMA 12/23/2009    Past Surgical History:  Procedure Laterality Date  . TUBAL LIGATION       OB History   No obstetric history on file.     History reviewed. No pertinent family history.  Social History   Tobacco Use  . Smoking status: Former Smoker    Types: Cigarettes    Quit date: 10/02/2018    Years since quitting: 2.0  . Smokeless tobacco: Never Used  Vaping Use  . Vaping Use: Never used  Substance Use Topics  . Alcohol use: Yes    Comment: sociallly  . Drug use: Yes    Frequency: 3.0 times per week    Types: Marijuana    Home Medications Prior to Admission medications   Medication Sig Start Date End Date Taking? Authorizing Provider  acetaminophen (TYLENOL) 500 MG tablet Take 1,000 mg by mouth every 6 (six) hours as needed (for pain).    [provider]  albuterol (PROVENTIL HFA;VENTOLIN HFA) 108 (90 BASE) MCG/ACT inhaler Inhale 2 puffs into the lungs every 4 (four) hours as needed for wheezing or shortness of breath (or coughing). Patient not taking: Reported on 03/03/2020 04/10/15   [name], MD  aspirin-acetaminophen-caffeine (PAMPRIN MAX) [phone] MG tablet Take 1 tablet by mouth every 6 (six) hours as needed (for pain).  [provider]  hydrochlorothiazide (HYDRODIURIL) 25 MG tablet Take 25 mg by mouth daily. 08/03/17   [provider]  losartan (COZAAR) 100 MG tablet Take 100 mg by mouth daily. 08/03/17   [provider]  ondansetron (ZOFRAN) 4 MG tablet Take 1 tablet (4 mg total) by mouth every 8 (eight) hours as needed for nausea or vomiting. 05/19/20   Tegeler, Canary Brim, MD  oxyCODONE-acetaminophen (PERCOCET/ROXICET) 5-325 MG tablet Take 1 tablet by mouth every 4 (four) hours as needed for severe pain. 05/19/20   Tegeler, Canary Brim, MD  polyethylene glycol (MIRALAX / GLYCOLAX) 17 g  packet Take 17 g by mouth daily as needed for mild constipation or moderate constipation. 03/07/20   Adam Phenix, PA-C    Allergies    Other  Review of Systems   Review of Systems  Constitutional: Negative for fever.  HENT: Negative for sore throat.   Eyes: Negative for visual disturbance.  Respiratory: Negative for cough and shortness of breath.   Cardiovascular: Negative for chest pain.  Gastrointestinal: Positive for abdominal pain and nausea. Negative for constipation, diarrhea, hematemesis, hematochezia and vomiting.  Genitourinary: Negative for dysuria, hematuria, vaginal bleeding and vaginal discharge.  Musculoskeletal: Positive for back pain.  Skin: Negative for rash.  Neurological: Negative for headaches.    Physical Exam Updated Vital Signs BP (!) 155/97 (BP Location: Left Arm)   Pulse 83   Temp 98 F (36.7 C)   Resp 20   LMP 09/09/2020   SpO2 100%   Physical Exam Vitals and nursing note reviewed.  Constitutional:      General: She is not in acute distress.    Appearance: Normal appearance. She is well-developed.  HENT:     Head: Normocephalic and atraumatic.  Eyes:     Conjunctiva/sclera: Conjunctivae normal.  Cardiovascular:     Rate and Rhythm: Normal rate and regular rhythm.     Heart sounds: No murmur heard.   Pulmonary:     Effort: Pulmonary effort is normal. No respiratory distress.     Breath sounds: Normal breath sounds.  Abdominal:     Palpations: Abdomen is soft.     Tenderness: There is generalized abdominal tenderness. There is no guarding or rebound.  Musculoskeletal:        General: No deformity or signs of injury. Normal range of motion.     Cervical back: Neck supple.  Skin:    General: Skin is warm and dry.     Capillary Refill: Capillary refill takes less than 2 seconds.  Neurological:     General: No focal deficit present.     Mental Status: She is alert.     ED Results / Procedures / Treatments   Labs (all labs  ordered are listed, but only abnormal results are displayed) Labs Reviewed  LIPASE, BLOOD - Abnormal; Notable for the following components:      Result Value   Lipase 205 (*)    All other components within normal limits  COMPREHENSIVE METABOLIC PANEL - Abnormal; Notable for the following components:   Glucose, Bld 117 (*)    Calcium 8.6 (*)    Albumin 3.2 (*)    AST 13 (*)    All other components within normal limits  CBC  URINALYSIS, ROUTINE W REFLEX MICROSCOPIC  I-STAT BETA HCG BLOOD, ED ([facility], [facility], [facility] ONLY)    EKG None  Radiology CT Abdomen Pelvis W Contrast  Result Date: 10/04/2020 CLINICAL DATA:  Lower abdominal pain EXAM: CT  ABDOMEN AND PELVIS WITH CONTRAST TECHNIQUE: Multidetector CT imaging of the abdomen and pelvis was performed using the standard protocol following bolus administration of intravenous contrast. CONTRAST:  OMNIPAQUE IOHEXOL 300 MG/ML  SOLN COMPARISON:  05/18/2020 FINDINGS: Lower chest: Lung bases are clear. No effusions. Heart is normal size. Hepatobiliary: Small cyst in the left hepatic lobe, stable. No suspicious focal hepatic abnormality. Gallbladder unremarkable. Pancreas: No focal abnormality or ductal dilatation. Spleen: No focal abnormality.  Normal size. Adrenals/Urinary Tract: No adrenal abnormality. No focal renal abnormality. No stones or hydronephrosis. Urinary bladder is unremarkable. Stomach/Bowel: Normal appendix. Sigmoid diverticulosis. Inflammatory stranding around the mid sigmoid colon compatible with active diverticulitis. Stomach and small bowel decompressed, unremarkable. Vascular/Lymphatic: No evidence of aneurysm or adenopathy. Reproductive: Uterus and adnexa unremarkable.  No mass. Other: No free fluid or free air. Musculoskeletal: No acute bony abnormality. IMPRESSION: Sigmoid diverticulosis. Inflammatory stranding around the mid sigmoid colon compatible with active diverticulitis. Electronically Signed   By: Charlett Nose M.D.   On:  10/04/2020 15:20    Procedures Procedures   Medications Ordered in ED Medications  morphine 4 MG/ML injection 4 mg (4 mg Intravenous Given 10/04/20 1348)  ondansetron (ZOFRAN) injection 4 mg (4 mg Intravenous Given 10/04/20 1348)  sodium chloride 0.9 % bolus 1,000 mL (0 mLs Intravenous Stopped 10/04/20 1542)  iohexol (OMNIPAQUE) 300 MG/ML solution 100 mL (100 mLs Intravenous Contrast Given 10/04/20 1507)    ED Course  I have reviewed the triage vital signs and the nursing notes.  Pertinent labs & imaging results that were available during my care of the patient were reviewed by me and considered in my medical decision making (see chart for details).  Clinical Course as of 10/04/20 1800  Sun Oct 04, 2020  1536 Reviewed results with patient.  She is comfortable going home.  We will send prescriptions for pain medicine antibiotics and nausea medication.  She understands to return  if any worsening symptoms [MB]    Clinical Course User Index [MB] Terrilee Files, MD   MDM Rules/Calculators/A&P                         This patient complains of abdominal pain radiating into back; this involves an extensive number of treatment Options and is a complaint that carries with it a high risk of complications and Morbidity. The differential includes diverticulitis, colitis, obstruction, pancreatitis, cholelithiasis, cholecystitis, UTI, renal colic  I ordered, reviewed and interpreted labs, which included CBC with normal white count normal hemoglobin, chemistries fairly normal, LFTs unremarkable, lipase elevated at 205, pregnancy negative I ordered medication IV fluids IV pain and nausea medication with improvement in her symptoms I ordered imaging studies which included CT abdomen and pelvis and I independently    visualized and interpreted imaging which showed acute sigmoid diverticulitis.  No evidence of pancreatic inflammation.  Previous records obtained and reviewed in epic, was admitted for  diverticulitis in 2021  After the interventions stated above, I reevaluated the patient and found patient symptoms to be controlled.  We discussed outpatient management versus inpatient and she is comfortable plan for trial of outpatient antibiotics and pain medicine.  Return instructions discussed.   Final Clinical Impression(s) / ED Diagnoses Final diagnoses:  Diverticulitis  Abnormal serum level of lipase    Rx / DC Orders ED Discharge Orders         Ordered    oxyCODONE-acetaminophen (PERCOCET/ROXICET) 5-325 MG tablet  Every 6 hours PRN  10/04/20 1539    amoxicillin-clavulanate (AUGMENTIN) 875-125 MG tablet  Every 12 hours        10/04/20 1539    ondansetron (ZOFRAN) 4 MG tablet  Every 8 hours PRN        10/04/20 1539           Terrilee FilesButler, Jacklyn Branan C, MD 10/04/20 [phone]

## 2020-10-04 NOTE — ED Notes (Signed)
Pt verbalizes understanding of discharge instructions. Opportunity for questions and answers were provided. Pt discharged from the ED.   ?

## 2021-05-13 DIAGNOSIS — Z8719 Personal history of other diseases of the digestive system: Secondary | ICD-10-CM | POA: Insufficient documentation

## 2021-09-15 DIAGNOSIS — F1220 Cannabis dependence, uncomplicated: Secondary | ICD-10-CM | POA: Insufficient documentation

## 2022-05-27 ENCOUNTER — Encounter: Payer: Self-pay | Admitting: Physician Assistant

## 2022-05-27 ENCOUNTER — Ambulatory Visit (INDEPENDENT_AMBULATORY_CARE_PROVIDER_SITE_OTHER): Payer: No Typology Code available for payment source | Admitting: Physician Assistant

## 2022-05-27 VITALS — BP 152/95 | HR 62 | Ht 63.0 in | Wt 298.0 lb

## 2022-05-27 DIAGNOSIS — Z1329 Encounter for screening for other suspected endocrine disorder: Secondary | ICD-10-CM | POA: Diagnosis not present

## 2022-05-27 DIAGNOSIS — R7303 Prediabetes: Secondary | ICD-10-CM

## 2022-05-27 DIAGNOSIS — Z79899 Other long term (current) drug therapy: Secondary | ICD-10-CM | POA: Diagnosis not present

## 2022-05-27 DIAGNOSIS — Z6841 Body Mass Index (BMI) 40.0 and over, adult: Secondary | ICD-10-CM

## 2022-05-27 DIAGNOSIS — Z23 Encounter for immunization: Secondary | ICD-10-CM | POA: Diagnosis not present

## 2022-05-27 DIAGNOSIS — Z13220 Encounter for screening for lipoid disorders: Secondary | ICD-10-CM

## 2022-05-27 DIAGNOSIS — I10 Essential (primary) hypertension: Secondary | ICD-10-CM

## 2022-05-27 NOTE — Progress Notes (Unsigned)
New Patient Office Visit  Subjective    Patient ID: Paula Fitzgerald, female    DOB: 12-27-1975  Age: 46 y.o. MRN: [id number]  CC: No chief complaint on file.   HPI [name] presents to establish care. She is actively working on weight loss with diet and exercise. She has lost 15-20lbs on her own. She has not been able to get her wegovy to start for weight loss. She is active and on her feet at work all day. She denies any CP, palpitations, headaches or vision changes.   Outpatient Encounter Medications as of 05/27/2022  Medication Sig   naproxen (NAPROSYN) 500 MG tablet Take by mouth.   Semaglutide-Weight Management 0.25 MG/0.5ML SOAJ Inject into the skin.   valsartan-hydrochlorothiazide (DIOVAN-HCT) 320-25 MG tablet Take 1 tablet by mouth daily.   acetaminophen (TYLENOL) 500 MG tablet Take 1,000 mg by mouth every 6 (six) hours as needed (for pain).   aspirin-acetaminophen-caffeine (PAMPRIN MAX) 250-250-65 MG tablet Take 1 tablet by mouth every 6 (six) hours as needed (for pain).   hydrochlorothiazide (HYDRODIURIL) 25 MG tablet Take 25 mg by mouth daily.   losartan (COZAAR) 100 MG tablet Take 100 mg by mouth daily.   Multiple Vitamin (QUINTABS) TABS Take 1 tablet by mouth daily.   oxyCODONE-acetaminophen (PERCOCET/ROXICET) 5-325 MG tablet Take 1-2 tablets by mouth every 6 (six) hours as needed for severe pain.   polyethylene glycol (MIRALAX / GLYCOLAX) 17 g packet Take 17 g by mouth daily as needed for mild constipation or moderate constipation.   [DISCONTINUED] albuterol (PROVENTIL HFA;VENTOLIN HFA) 108 (90 BASE) MCG/ACT inhaler Inhale 2 puffs into the lungs every 4 (four) hours as needed for wheezing or shortness of breath (or coughing). (Patient not taking: Reported on 03/03/2020)   [DISCONTINUED] amoxicillin-clavulanate (AUGMENTIN) 875-125 MG tablet Take 1 tablet by mouth every 12 (twelve) hours.   [DISCONTINUED] ondansetron (ZOFRAN) 4 MG tablet Take 1  tablet (4 mg total) by mouth every 8 (eight) hours as needed for nausea or vomiting.   No facility-administered encounter medications on file as of 05/27/2022.    Past Medical History:  Diagnosis Date   Asthma    Bipolar 1 disorder (HCC)    Depressed    Diverticulitis 03/03/2020   Hypertension    Obesity    Panic attacks     Past Surgical History:  Procedure Laterality Date   TUBAL LIGATION      No family history on file.  Social History   Socioeconomic History   Marital status: Single    Spouse name: Not on file   Number of children: Not on file   Years of education: Not on file   Highest education level: Not on file  Occupational History   Not on file  Tobacco Use   Smoking status: Former    Types: Cigarettes    Quit date: 10/02/2018    Years since quitting: 3.6   Smokeless tobacco: Never  Vaping Use   Vaping Use: Never used  Substance and Sexual Activity   Alcohol use: Yes    Comment: sociallly   Drug use: Yes    Frequency: 3.0 times per week    Types: Marijuana   Sexual activity: Not on file  Other Topics Concern   Not on file  Social History Narrative   Not on file   Social Determinants of Health   Financial Resource Strain: Not on file  Food Insecurity: Not on file  Transportation Needs: Not on file  Physical Activity: Not on file  Stress: Not on file  Social Connections: Not on file  Intimate Partner Violence: Not on file    Review of Systems  All other systems reviewed and are negative.       Objective    There were no vitals taken for this visit.  Physical Exam Constitutional:      Appearance: Normal appearance. She is obese.  HENT:     Head: Normocephalic.  Cardiovascular:     Rate and Rhythm: Normal rate and regular rhythm.     Pulses: Normal pulses.  Pulmonary:     Effort: Pulmonary effort is normal.     Breath sounds: Normal breath sounds.  Neurological:     General: No focal deficit present.     Mental Status: She is  alert and oriented to person, place, and time.  Psychiatric:        Mood and Affect: Mood normal.          Assessment & Plan:     Tandy Gaw, PA-C

## 2022-05-28 LAB — CBC WITH DIFFERENTIAL/PLATELET
Absolute Monocytes: 438 cells/uL (ref 200–950)
Basophils Absolute: 30 cells/uL (ref 0–200)
Basophils Relative: 0.5 %
Eosinophils Absolute: 282 cells/uL (ref 15–500)
Eosinophils Relative: 4.7 %
HCT: 35.8 % (ref 35.0–45.0)
Hemoglobin: 12.1 g/dL (ref 11.7–15.5)
Lymphs Abs: 2454 cells/uL (ref 850–3900)
MCH: 31.2 pg (ref 27.0–33.0)
MCHC: 33.8 g/dL (ref 32.0–36.0)
MCV: 92.3 fL (ref 80.0–100.0)
MPV: 9.8 fL (ref 7.5–12.5)
Monocytes Relative: 7.3 %
Neutro Abs: 2796 cells/uL (ref 1500–7800)
Neutrophils Relative %: 46.6 %
Platelets: 276 10*3/uL (ref 140–400)
RBC: 3.88 10*6/uL (ref 3.80–5.10)
RDW: 14.2 % (ref 11.0–15.0)
Total Lymphocyte: 40.9 %
WBC: 6 10*3/uL (ref 3.8–10.8)

## 2022-05-28 LAB — COMPLETE METABOLIC PANEL WITH GFR
AG Ratio: 1.6 (calc) (ref 1.0–2.5)
ALT: 14 U/L (ref 6–29)
AST: 12 U/L (ref 10–35)
Albumin: 3.9 g/dL (ref 3.6–5.1)
Alkaline phosphatase (APISO): 72 U/L (ref 31–125)
BUN: 23 mg/dL (ref 7–25)
CO2: 31 mmol/L (ref 20–32)
Calcium: 9.2 mg/dL (ref 8.6–10.2)
Chloride: 105 mmol/L (ref 98–110)
Creat: 0.8 mg/dL (ref 0.50–0.99)
Globulin: 2.5 g/dL (calc) (ref 1.9–3.7)
Glucose, Bld: 93 mg/dL (ref 65–99)
Potassium: 3.7 mmol/L (ref 3.5–5.3)
Sodium: 142 mmol/L (ref 135–146)
Total Bilirubin: 0.5 mg/dL (ref 0.2–1.2)
Total Protein: 6.4 g/dL (ref 6.1–8.1)
eGFR: 91 mL/min/{1.73_m2} (ref >=60)

## 2022-05-28 LAB — LIPID PANEL W/REFLEX DIRECT LDL
Cholesterol: 208 mg/dL — ABNORMAL HIGH (ref <200)
HDL: 53 mg/dL (ref >=50)
LDL Cholesterol (Calc): 140 mg/dL (calc) — ABNORMAL HIGH
Non-HDL Cholesterol (Calc): 155 mg/dL (calc) — ABNORMAL HIGH (ref <130)
Total CHOL/HDL Ratio: 3.9 (calc) (ref <5.0)
Triglycerides: 59 mg/dL (ref <150)

## 2022-05-28 LAB — HEMOGLOBIN A1C
Hgb A1c MFr Bld: 6 % of total Hgb — ABNORMAL HIGH (ref <5.7)
Mean Plasma Glucose: 126 mg/dL
eAG (mmol/L): 7 mmol/L

## 2022-05-28 LAB — TSH: TSH: 0.77 mIU/L

## 2022-05-30 ENCOUNTER — Encounter: Payer: Self-pay | Admitting: Physician Assistant

## 2022-05-30 DIAGNOSIS — E785 Hyperlipidemia, unspecified: Secondary | ICD-10-CM | POA: Insufficient documentation

## 2022-05-30 DIAGNOSIS — Z1329 Encounter for screening for other suspected endocrine disorder: Secondary | ICD-10-CM | POA: Insufficient documentation

## 2022-05-30 NOTE — Progress Notes (Signed)
Clare,   WBC and hemoglobin look good.  Thyroid looks good.  Kidney, liver, calcium look good.  A1C in pre-diabetes range. Work on low sugar diet and 150 minutes of exercise a week. Due to pre-diabetes and obesity we could try to get rybelsus or ozempic approved to help with elevated glucose and weight. Thoughts?  LDL is not to goal.  I would suggest starting a statin. Thoughts.   Marland Kitchen.The 10-year ASCVD risk score (Arnett DK, et al., 2019) is: 6.4%   Values used to calculate the score:     Age: 46 years     Sex: Female     Is Non-Hispanic African American: Yes     Diabetic: No     Tobacco smoker: No     Systolic Blood Pressure: 152 mmHg     Is BP treated: Yes     HDL Cholesterol: 53 mg/dL     Total Cholesterol: 208 mg/dL

## 2022-05-31 ENCOUNTER — Other Ambulatory Visit: Payer: Self-pay | Admitting: Physician Assistant

## 2022-05-31 MED ORDER — RYBELSUS 7 MG PO TABS: 7.0000 mg | ORAL_TABLET | Freq: Every day | ORAL | 1 refills | Status: DC

## 2022-05-31 MED ORDER — ATORVASTATIN CALCIUM 40 MG PO TABS: 40.0000 mg | ORAL_TABLET | Freq: Every day | ORAL | 3 refills | Status: DC

## 2022-05-31 MED ORDER — RYBELSUS 3 MG PO TABS: 3.0000 mg | ORAL_TABLET | Freq: Every day | ORAL | 0 refills | Status: DC

## 2022-05-31 NOTE — Telephone Encounter (Signed)
Jomarie Longs, PA-C 05/30/2022  6:42 AM EST     Paula Fitzgerald,   WBC and hemoglobin look good. Thyroid looks good. Kidney, liver, calcium look good. A1C in pre-diabetes range. Work on low sugar diet and 150 minutes of exercise a week. Due to pre-diabetes and obesity we could try to get rybelsus or ozempic approved to help with elevated glucose and weight. Thoughts? LDL is not to goal. I would suggest starting a statin. Thoughts.    Patient sent in response to lab results. She also left a vm that she is okay starting a statin and any blood sugar lowering medication that you think she should start. Please send to pharmacy on file.

## 2022-06-03 ENCOUNTER — Telehealth: Payer: Self-pay

## 2022-06-07 NOTE — Telephone Encounter (Signed)
Initiated Prior authorization [id number] 3MG  tablets/Rybelsus 7MG  tablets Via: Covermymeds Case/Key:[id number] Status: approved  as of 06/07/22 Reason:This drug has been approved. Approved quantity: 30 tablets per 30 day(s). You may fill up to a 34 day supply at a retail pharmacy. You may fill up to a 90 day supply for maintenance drugs,Approval Timeframe: Start Date 06/03/2022 End Date 06/03/2023 Notified Pt via: Mychart

## 2022-07-01 ENCOUNTER — Encounter: Payer: Self-pay | Admitting: Podiatry

## 2022-07-01 ENCOUNTER — Ambulatory Visit (INDEPENDENT_AMBULATORY_CARE_PROVIDER_SITE_OTHER): Payer: No Typology Code available for payment source | Admitting: Podiatry

## 2022-07-01 DIAGNOSIS — L600 Ingrowing nail: Secondary | ICD-10-CM

## 2022-07-01 DIAGNOSIS — B351 Tinea unguium: Secondary | ICD-10-CM

## 2022-07-01 DIAGNOSIS — M79671 Pain in right foot: Secondary | ICD-10-CM

## 2022-07-01 DIAGNOSIS — Z79899 Other long term (current) drug therapy: Secondary | ICD-10-CM | POA: Diagnosis not present

## 2022-07-01 DIAGNOSIS — S93401A Sprain of unspecified ligament of right ankle, initial encounter: Secondary | ICD-10-CM

## 2022-07-01 MED ORDER — IBUPROFEN 800 MG PO TABS: 800.0000 mg | ORAL_TABLET | Freq: Three times a day (TID) | ORAL | 0 refills | Status: DC | PRN

## 2022-07-01 MED ORDER — TERBINAFINE HCL 250 MG PO TABS: 250.0000 mg | ORAL_TABLET | Freq: Every day | ORAL | 0 refills | Status: DC

## 2022-07-01 NOTE — Progress Notes (Unsigned)
Subjective:   Patient ID: Paula Fitzgerald, female   DOB: 46 y.o.   MRN: [id number]   HPI Chief Complaint  Patient presents with   Ingrown Toenail    Bilateral hallux ingrown toenails, patient denies any redness and swelling or drainage,  patient also had injury right ankle May 06 2022, Rate of pain 5 out 10,  sharp pain, X-Rays done today,     The toenails have been ongoing for about 1 month but it has been getting worse. No drainage. No other treatment  Oct 20th she rolled her ankle. She put an ACE bandage on it and ice it but no other treatment. She also tried a brace but it keep swelling. She got new shoes as well.  She works at [employer].    Review of Systems  All other systems reviewed and are negative.  Past Medical History:  Diagnosis Date   Asthma    Bipolar 1 disorder (HCC)    Depressed    Diverticulitis 03/03/2020   Hypertension    Obesity    Panic attacks     Past Surgical History:  Procedure Laterality Date   SIGMOIDECTOMY     TUBAL LIGATION       Current Outpatient Medications:    acetaminophen (TYLENOL) 500 MG tablet, Take 1,000 mg by mouth every 6 (six) hours as needed (for pain)., Disp: , Rfl:    aspirin-acetaminophen-caffeine (PAMPRIN MAX) 250-250-65 MG tablet, Take 1 tablet by mouth every 6 (six) hours as needed (for pain)., Disp: , Rfl:    atorvastatin (LIPITOR) 40 MG tablet, Take 1 tablet (40 mg total) by mouth daily., Disp: 90 tablet, Rfl: 3   ibuprofen (ADVIL) 800 MG tablet, Take 800 mg by mouth daily., Disp: , Rfl:    Multiple Vitamin (QUINTABS) TABS, Take 1 tablet by mouth daily., Disp: , Rfl:    naproxen (NAPROSYN) 500 MG tablet, Take by mouth., Disp: , Rfl:    oxyCODONE-acetaminophen (PERCOCET/ROXICET) 5-325 MG tablet, Take 1-2 tablets by mouth every 6 (six) hours as needed for severe pain. (Patient not taking: Reported on 07/01/2022), Disp: 15 tablet, Rfl: 0   polyethylene glycol (MIRALAX / GLYCOLAX) 17 g packet, Take 17 g by mouth  daily as needed for mild constipation or moderate constipation., Disp: 14 each, Rfl: 0   Semaglutide (RYBELSUS) 3 MG TABS, Take 3 mg by mouth daily., Disp: 30 tablet, Rfl: 0   Semaglutide (RYBELSUS) 7 MG TABS, Take 7 mg by mouth daily., Disp: 30 tablet, Rfl: 1   valsartan-hydrochlorothiazide (DIOVAN-HCT) 320-25 MG tablet, Take 1 tablet by mouth daily., Disp: , Rfl:   Allergies  Allergen Reactions   Other Hives, Rash and Other (See Comments)    Bell Pepper- Rash  Unnamed B/P med- Hives          Objective:  Physical Exam  General: AAO x3, NAD  Dermatological: Skin is warm, dry and supple bilateral. Nails x 10 are well manicured; remaining integument appears unremarkable at this time. There are no open sores, no preulcerative lesions, no rash or signs of infection present.  Vascular: Dorsalis Pedis artery and Posterior Tibial artery pedal pulses are 2/4 bilateral with immedate capillary fill time. Pedal hair growth present. No varicosities and no lower extremity edema present bilateral. There is no pain with calf compression, swelling, warmth, erythema.   Neruologic: Grossly intact via light touch bilateral. Vibratory intact via tuning fork bilateral. Protective threshold with Semmes Wienstein monofilament intact to all pedal sites bilateral. Patellar and Achilles deep  tendon reflexes 2+ bilateral. No Babinski or clonus noted bilateral.   Musculoskeletal: No gross boney pedal deformities bilateral. No pain, crepitus, or limitation noted with foot and ankle range of motion bilateral. Muscular strength 5/5 in all groups tested bilateral.  Gait: Unassisted, Nonantalgic.       Assessment:  ***     Plan:  ***

## 2022-07-01 NOTE — Patient Instructions (Addendum)
Soak Instructions    THE DAY AFTER THE PROCEDURE  Place 1/4 cup of epsom salts in a quart of warm tap water.  Submerge your foot or feet with outer bandage intact for the initial soak; this will allow the bandage to become moist and wet for easy lift off.  Once you remove your bandage, continue to soak in the solution for 20 minutes.  This soak should be done twice a day.  Next, remove your foot or feet from solution, blot dry the affected area and cover.  You may use a band aid large enough to cover the area or use gauze and tape.  Apply other medications to the area as directed by the doctor such as polysporin neosporin.  IF YOUR SKIN BECOMES IRRITATED WHILE USING THESE INSTRUCTIONS, IT IS OKAY TO SWITCH TO  WHITE VINEGAR AND WATER. Or you may use antibacterial soap and water to keep the toe clean  Monitor for any signs/symptoms of infection. Call the office immediately if any occur or go directly to the emergency room. Call with any questions/concerns.  Terbinafine Tablets What is this medication? TERBINAFINE (TER bin a feen) treats fungal infections of the nails. It belongs to a group of medications called antifungals. It will not treat infections caused by bacteria or viruses. This medicine may be used for other purposes; ask your health care provider or pharmacist if you have questions. COMMON BRAND NAME(S): Lamisil, Terbinex What should I tell my care team before I take this medication? They need to know if you have any of these conditions: Liver disease An unusual or allergic reaction to terbinafine, other medications, foods, dyes, or preservatives Pregnant or trying to get pregnant Breast-feeding How should I use this medication? Take this medication by mouth with water. Take it as directed on the prescription label at the same time every day. You can take it with or without food. If it upsets your stomach, take it with food. Keep taking it unless your care team tells you to stop. A  special MedGuide will be given to you by the pharmacist with each prescription and refill. Be sure to read this information carefully each time. Talk to your care team regarding the use of this medication in children. Special care may be needed. Overdosage: If you think you have taken too much of this medicine contact a poison control center or emergency room at once. NOTE: This medicine is only for you. Do not share this medicine with others. What if I miss a dose? If you miss a dose, take it as soon as you can unless it is more than 4 hours late. If it is more than 4 hours late, skip the missed dose. Take the next dose at the normal time. What may interact with this medication? Do not take this medication with any of the following: Pimozide Thioridazine This medication may also interact with the following: Beta blockers Caffeine Certain medications for mental health conditions Cimetidine Cyclosporine Medications for fungal infections like fluconazole and ketoconazole Medications for irregular heartbeat like amiodarone, flecainide and propafenone Rifampin Warfarin This list may not describe all possible interactions. Give your health care provider a list of all the medicines, herbs, non-prescription drugs, or dietary supplements you use. Also tell them if you smoke, drink alcohol, or use illegal drugs. Some items may interact with your medicine. What should I watch for while using this medication? Visit your care team for regular checks on your progress. You may need blood work while you   are taking this medication. It may be some time before you see the benefit from this medication. This medication may cause serious skin reactions. They can happen weeks to months after starting the medication. Contact your care team right away if you notice fevers or flu-like symptoms with a rash. The rash may be red or purple and then turn into blisters or peeling of the skin. Or, you might notice a red rash  with swelling of the face, lips or lymph nodes in your neck or under your arms. This medication can make you more sensitive to the sun. Keep out of the sun, If you cannot avoid being in the sun, wear protective clothing and sunscreen. Do not use sun lamps or tanning beds/booths. What side effects may I notice from receiving this medication? Side effects that you should report to your care team as soon as possible: Allergic reactions--skin rash, itching, hives, swelling of the face, lips, tongue, or throat Change in sense of smell Change in taste Infection--fever, chills, cough, or sore throat Liver injury--right upper belly pain, loss of appetite, nausea, light-colored stool, dark yellow or brown urine, yellowing skin or eyes, unusual weakness or fatigue Low red blood cell level--unusual weakness or fatigue, dizziness, headache, trouble breathing Lupus-like syndrome--joint pain, swelling, or stiffness, butterfly-shaped rash on the face, rashes that get worse in the sun, fever, unusual weakness or fatigue Rash, fever, and swollen lymph nodes Redness, blistering, peeling, or loosening of the skin, including inside the mouth Unusual bruising or bleeding Worsening mood, feelings of depression Side effects that usually do not require medical attention (report to your care team if they continue or are bothersome): Diarrhea Gas Headache Nausea Stomach pain Upset stomach This list may not describe all possible side effects. Call your doctor for medical advice about side effects. You may report side effects to FDA at 1-800-FDA-1088. Where should I keep my medication? Keep out of the reach of children and pets. Store between 20 and 25 degrees C (68 and 77 degrees F). Protect from light. Get rid of any unused medication after the expiration date. To get rid of medications that are no longer needed or have expired: Take the medication to a medication take-back program. Check with your pharmacy or law  enforcement to find a location. If you cannot return the medication, check the label or package insert to see if the medication should be thrown out in the garbage or flushed down the toilet. If you are not sure, ask your care team. If it is safe to put it in the trash, take the medication out of the container. Mix the medication with cat litter, dirt, coffee grounds, or other unwanted substance. Seal the mixture in a bag or container. Put it in the trash. NOTE: This sheet is a summary. It may not cover all possible information. If you have questions about this medicine, talk to your doctor, pharmacist, or health care provider.  2023 Elsevier/Gold Standard (2021-01-26 00:00:00)  Ankle Sprain, Phase I Rehab An ankle sprain is an injury to the ligaments of your ankle. Ankle sprains cause stiffness, loss of motion, and loss of strength. Ask your health care provider which exercises are safe for you. Do exercises exactly as told by your health care provider and adjust them as directed. It is normal to feel mild stretching, pulling, tightness, or discomfort as you do these exercises. Stop right away if you feel sudden pain or your pain gets worse. Do not begin these exercises until told by  your health care provider. Stretching and range-of-motion exercises These exercises warm up your muscles and joints and improve the movement and flexibility of your lower leg and ankle. These exercises also help to relieve pain and stiffness. Gastroc and soleus stretch This exercise is also called a calf stretch. It stretches the muscles in the back of the lower leg. These muscles are the gastrocnemius, or gastroc, and the soleus. Sit on the floor with your left / right leg extended. Loop a belt or towel around the ball of your left / right foot. The ball of your foot is on the walking surface, right under your toes. Keep your left / right ankle and foot relaxed and keep your knee straight while you use the belt or towel  to pull your foot toward you. You should feel a gentle stretch behind your calf or knee in your gastroc muscle. Hold this position for __________ seconds, then release to the starting position. Repeat the exercise with your knee bent. You can put a pillow or a rolled bath towel under your knee to support it. You should feel a stretch deep in your calf in the soleus muscle or at your Achilles tendon. Repeat __________ times. Complete this exercise __________ times a day. Ankle alphabet  Sit with your left / right leg supported at the lower leg. Do not rest your foot on anything. Make sure your foot has room to move freely. Think of your left / right foot as a paintbrush. Move your foot to trace each letter of the alphabet in the air. Keep your hip and knee still while you trace. Make the letters as large as you can without feeling discomfort. Trace every letter from A to Z. Repeat __________ times. Complete this exercise __________ times a day. Strengthening exercises These exercises build strength and endurance in your ankle and lower leg. Endurance is the ability to use your muscles for a long time, even after they get tired. Ankle dorsiflexion  Secure a rubber exercise band or tube to an object, such as a table leg, that will stay still when the band is pulled. Secure the other end around your left / right foot. Sit on the floor facing the object, with your left / right leg extended. The band or tube should be slightly tense when your foot is relaxed. Slowly bring your foot toward you, bringing the top of your foot toward your shin (dorsiflexion), and pulling the band tighter. Hold this position for __________ seconds. Slowly return your foot to the starting position. Repeat __________ times. Complete this exercise __________ times a day. Ankle plantar flexion  Sit on the floor with your left / right leg extended. Loop a rubber exercise tube or band around the ball of your left / right  foot. The ball of your foot is on the walking surface, right under your toes. Hold the ends of the band or tube in your hands. The band or tube should be slightly tense when your foot is relaxed. Slowly point your foot and toes downward to tilt the top of your foot away from your shin (plantar flexion). Hold this position for __________ seconds. Slowly return your foot to the starting position. Repeat __________ times. Complete this exercise __________ times a day. Ankle eversion Sit on the floor with your legs straight out in front of you. Loop a rubber exercise band or tube around the ball of your left / right foot. The ball of your foot is on the walking  surface, right under your toes. Hold the ends of the band in your hands, or secure the band to a stable object. The band or tube should be slightly tense when your foot is relaxed. Slowly push your foot outward, away from your other leg (eversion). Hold this position for __________ seconds. Slowly return your foot to the starting position. Repeat __________ times. Complete this exercise __________ times a day. This information is not intended to replace advice given to you by your health care provider. Make sure you discuss any questions you have with your health care provider. Document Revised: 08/25/2020 Document Reviewed: 08/27/2020 Elsevier Patient Education  2023 [employer].

## 2022-07-06 ENCOUNTER — Telehealth: Payer: Self-pay | Admitting: *Deleted

## 2022-07-06 NOTE — Telephone Encounter (Addendum)
Employer told patient  that she cannot work with boot but can she wear an air cast ([employer],similar to the boot) ,will allow her to wear w/ a note given , please advise.  Spoke with patient giving recommendations and that a note can be written to wear the air cast to work, or come in to get the trilock brace, said that she does not need the note right now, employer just called and said that she can wear the boot to work,will call back if she needs the note for later.

## 2022-07-12 ENCOUNTER — Emergency Department (HOSPITAL_COMMUNITY)
Admission: EM | Admit: 2022-07-12 | Discharge: 2022-07-12 | Disposition: A | Discharge status: Home / Self Care | Payer: No Typology Code available for payment source | Attending: Emergency Medicine | Admitting: Emergency Medicine

## 2022-07-12 ENCOUNTER — Emergency Department (HOSPITAL_COMMUNITY): Payer: No Typology Code available for payment source

## 2022-07-12 ENCOUNTER — Encounter (HOSPITAL_COMMUNITY): Payer: Self-pay

## 2022-07-12 DIAGNOSIS — J45909 Unspecified asthma, uncomplicated: Secondary | ICD-10-CM | POA: Diagnosis not present

## 2022-07-12 DIAGNOSIS — E876 Hypokalemia: Secondary | ICD-10-CM | POA: Diagnosis not present

## 2022-07-12 DIAGNOSIS — D72819 Decreased white blood cell count, unspecified: Secondary | ICD-10-CM | POA: Diagnosis not present

## 2022-07-12 DIAGNOSIS — J111 Influenza due to unidentified influenza virus with other respiratory manifestations: Secondary | ICD-10-CM

## 2022-07-12 DIAGNOSIS — Z1152 Encounter for screening for COVID-19: Secondary | ICD-10-CM | POA: Insufficient documentation

## 2022-07-12 DIAGNOSIS — J189 Pneumonia, unspecified organism: Secondary | ICD-10-CM | POA: Diagnosis not present

## 2022-07-12 DIAGNOSIS — J101 Influenza due to other identified influenza virus with other respiratory manifestations: Secondary | ICD-10-CM | POA: Diagnosis not present

## 2022-07-12 DIAGNOSIS — I10 Essential (primary) hypertension: Secondary | ICD-10-CM | POA: Diagnosis not present

## 2022-07-12 DIAGNOSIS — Z79899 Other long term (current) drug therapy: Secondary | ICD-10-CM | POA: Diagnosis not present

## 2022-07-12 DIAGNOSIS — R059 Cough, unspecified: Secondary | ICD-10-CM | POA: Diagnosis present

## 2022-07-12 LAB — CBC WITH DIFFERENTIAL/PLATELET
Abs Immature Granulocytes: 0.01 10*3/uL (ref 0.00–0.07)
Basophils Absolute: 0 10*3/uL (ref 0.0–0.1)
Basophils Relative: 1 %
Eosinophils Absolute: 0.1 10*3/uL (ref 0.0–0.5)
Eosinophils Relative: 2 %
HCT: 37.3 % (ref 36.0–46.0)
Hemoglobin: 12.4 g/dL (ref 12.0–15.0)
Immature Granulocytes: 0 %
Lymphocytes Relative: 24 %
Lymphs Abs: 0.7 10*3/uL (ref 0.7–4.0)
MCH: 30.5 pg (ref 26.0–34.0)
MCHC: 33.2 g/dL (ref 30.0–36.0)
MCV: 91.6 fL (ref 80.0–100.0)
Monocytes Absolute: 0.4 10*3/uL (ref 0.1–1.0)
Monocytes Relative: 16 %
Neutro Abs: 1.5 10*3/uL — ABNORMAL LOW (ref 1.7–7.7)
Neutrophils Relative %: 57 %
Platelets: 212 10*3/uL (ref 150–400)
RBC: 4.07 MIL/uL (ref 3.87–5.11)
RDW: 14.6 % (ref 11.5–15.5)
WBC: 2.7 10*3/uL — ABNORMAL LOW (ref 4.0–10.5)
nRBC: 0 % (ref 0.0–0.2)

## 2022-07-12 LAB — RESP PANEL BY RT-PCR (RSV, FLU A&B, COVID)  RVPGX2
Influenza A by PCR: POSITIVE — AB
Influenza B by PCR: NEGATIVE
Resp Syncytial Virus by PCR: NEGATIVE
SARS Coronavirus 2 by RT PCR: NEGATIVE

## 2022-07-12 LAB — BASIC METABOLIC PANEL
Anion gap: 7 (ref 5–15)
BUN: 13 mg/dL (ref 6–20)
CO2: 29 mmol/L (ref 22–32)
Calcium: 9 mg/dL (ref 8.9–10.3)
Chloride: 102 mmol/L (ref 98–111)
Creatinine, Ser: 0.91 mg/dL (ref 0.44–1.00)
GFR, Estimated: >60 mL/min (ref >60)
Glucose, Bld: 95 mg/dL (ref 70–99)
Potassium: 3.1 mmol/L — ABNORMAL LOW (ref 3.5–5.1)
Sodium: 138 mmol/L (ref 135–145)

## 2022-07-12 MED ORDER — METHYLPREDNISOLONE SODIUM SUCC 125 MG IJ SOLR
125.0000 mg | Freq: Once | INTRAMUSCULAR | Status: AC
  Administered 2022-07-12: 125 mg via INTRAVENOUS
  Filled 2022-07-12: qty 2

## 2022-07-12 MED ORDER — MAGNESIUM SULFATE 50 % IJ SOLN: 2.0000 g | Freq: Once | INTRAMUSCULAR | Status: DC

## 2022-07-12 MED ORDER — DOXYCYCLINE HYCLATE 100 MG PO CAPS: 100.0000 mg | ORAL_CAPSULE | Freq: Two times a day (BID) | ORAL | 0 refills | Status: DC

## 2022-07-12 MED ORDER — DOXYCYCLINE HYCLATE 100 MG PO TABS
100.0000 mg | ORAL_TABLET | Freq: Once | ORAL | Status: AC
  Administered 2022-07-12: 100 mg via ORAL
  Filled 2022-07-12: qty 1

## 2022-07-12 MED ORDER — PREDNISONE 50 MG PO TABS: 50.0000 mg | ORAL_TABLET | Freq: Every day | ORAL | 0 refills | Status: DC

## 2022-07-12 MED ORDER — MAGNESIUM SULFATE 2 GM/50ML IV SOLN
2.0000 g | Freq: Once | INTRAVENOUS | Status: AC
  Administered 2022-07-12: 2 g via INTRAVENOUS
  Filled 2022-07-12: qty 50

## 2022-07-12 MED ORDER — POTASSIUM CHLORIDE CRYS ER 20 MEQ PO TBCR
40.0000 meq | EXTENDED_RELEASE_TABLET | Freq: Once | ORAL | Status: AC
  Administered 2022-07-12: 40 meq via ORAL
  Filled 2022-07-12: qty 2

## 2022-07-12 MED ORDER — IPRATROPIUM-ALBUTEROL 0.5-2.5 (3) MG/3ML IN SOLN
3.0000 mL | Freq: Once | RESPIRATORY_TRACT | Status: AC
  Administered 2022-07-12: 3 mL via RESPIRATORY_TRACT
  Filled 2022-07-12: qty 3

## 2022-07-12 NOTE — ED Provider Notes (Signed)
Paula Fitzgerald   CSN: [id number] Arrival date & time: 07/12/22  1014     History  Chief Complaint  Patient presents with   Cough   Shortness of Breath    Paula Fitzgerald is a 46 y.o. female.   Cough Associated symptoms: shortness of breath   Shortness of Breath Associated symptoms: cough      Patient has a history of asthma, hypertension, bipolar disorder, diverticulitis, panic attacks.  Patient states she was exposed to a coworker recently who was sick.  Patient states she started having symptoms a couple days ago with cough and congestion.  This morning she was feeling more short of breath and felt like she was wheezing.  Patient has had bodyaches.  She is not aware of having any fevers.  She denies any leg swelling.  No vomiting or diarrhea.  Home Medications Prior to Admission medications   Medication Sig Start Date End Date Taking? Authorizing Provider  doxycycline (VIBRAMYCIN) 100 MG capsule Take 1 capsule (100 mg total) by mouth 2 (two) times daily. 07/12/22  Yes Linwood Dibbles, MD  predniSONE (DELTASONE) 50 MG tablet Take 1 tablet (50 mg total) by mouth daily. 07/12/22  Yes Linwood Dibbles, MD  acetaminophen (TYLENOL) 500 MG tablet Take 1,000 mg by mouth every 6 (six) hours as needed (for pain).    [provider]  aspirin-acetaminophen-caffeine (PAMPRIN MAX) [phone] MG tablet Take 1 tablet by mouth every 6 (six) hours as needed (for pain).    [provider]  atorvastatin (LIPITOR) 40 MG tablet Take 1 tablet (40 mg total) by mouth daily. 05/31/22   Breeback, Jade L, Paula-C  ibuprofen (ADVIL) 800 MG tablet Take 1 tablet (800 mg total) by mouth every 8 (eight) hours as needed. 07/01/22   Vivi Barrack, DPM  Multiple Vitamin (QUINTABS) TABS Take 1 tablet by mouth daily.    [provider]  naproxen (NAPROSYN) 500 MG tablet Take by mouth. 01/04/22   [provider]   oxyCODONE-acetaminophen (PERCOCET/ROXICET) 5-325 MG tablet Take 1-2 tablets by mouth every 6 (six) hours as needed for severe pain. Patient not taking: Reported on 07/01/2022 10/04/20   Terrilee Files, MD  polyethylene glycol (MIRALAX / GLYCOLAX) 17 g packet Take 17 g by mouth daily as needed for mild constipation or moderate constipation. 03/07/20   Simaan, Francine Graven, Paula-C  Semaglutide (RYBELSUS) 3 MG TABS Take 3 mg by mouth daily. 05/31/22   Breeback, Jade L, Paula-C  Semaglutide (RYBELSUS) 7 MG TABS Take 7 mg by mouth daily. 05/31/22   Breeback, Jade L, Paula-C  terbinafine (LAMISIL) 250 MG tablet Take 1 tablet (250 mg total) by mouth daily. 07/01/22   Vivi Barrack, DPM  valsartan-hydrochlorothiazide (DIOVAN-HCT) 320-25 MG tablet Take 1 tablet by mouth daily. 04/07/22   [provider]      Allergies    Other    Review of Systems   Review of Systems  Respiratory:  Positive for cough and shortness of breath.     Physical Exam Updated Vital Signs BP (!) 143/99   Pulse (!) 106   Temp 99.2 F (37.3 C) (Oral)   Resp (!) 24   SpO2 95%  Physical Exam Vitals and nursing Fitzgerald reviewed.  Constitutional:      General: She is not in acute distress.    Appearance: She is well-developed.  HENT:     Head: Normocephalic and atraumatic.     Right Ear: External ear  normal.     Left Ear: External ear normal.  Eyes:     General: No scleral icterus.       Right eye: No discharge.        Left eye: No discharge.     Conjunctiva/sclera: Conjunctivae normal.  Neck:     Trachea: No tracheal deviation.  Cardiovascular:     Rate and Rhythm: Normal rate and regular rhythm.  Pulmonary:     Effort: Pulmonary effort is normal. No respiratory distress.     Breath sounds: Normal breath sounds. No stridor. No wheezing or rales.  Abdominal:     General: Bowel sounds are normal. There is no distension.     Palpations: Abdomen is soft.     Tenderness: There is no abdominal tenderness.  There is no guarding or rebound.  Musculoskeletal:        General: No tenderness or deformity.     Cervical back: Neck supple.  Skin:    General: Skin is warm and dry.     Findings: No rash.  Neurological:     General: No focal deficit present.     Mental Status: She is alert.     Cranial Nerves: No cranial nerve deficit, dysarthria or facial asymmetry.     Sensory: No sensory deficit.     Motor: No abnormal muscle tone or seizure activity.     Coordination: Coordination normal.  Psychiatric:        Mood and Affect: Mood normal.     ED Results / Procedures / Treatments   Labs (all labs ordered are listed, but only abnormal results are displayed) Labs Reviewed  RESP PANEL BY RT-PCR (RSV, FLU A&B, COVID)  RVPGX2 - Abnormal; Notable for the following components:      Result Value   Influenza A by PCR POSITIVE (*)    All other components within normal limits  BASIC METABOLIC PANEL - Abnormal; Notable for the following components:   Potassium 3.1 (*)    All other components within normal limits  CBC WITH DIFFERENTIAL/PLATELET - Abnormal; Notable for the following components:   WBC 2.7 (*)    Neutro Abs 1.5 (*)    All other components within normal limits    EKG EKG Interpretation  Date/Time:  Tuesday July 12 2022 11:27:12 EST Ventricular Rate:  101 PR Interval:  148 QRS Duration: 88 QT Interval:  340 QTC Calculation: 441 R Axis:   58 Text Interpretation: Sinus tachycardia Since last tracing rate faster Confirmed by Linwood Dibbles [phone]) on 07/12/2022 1:53:11 PM  Radiology DG Chest 2 View  Result Date: 07/12/2022 CLINICAL DATA:  Shortness of breath. EXAM: CHEST - 2 VIEW COMPARISON:  October 04, 2017. FINDINGS: The heart size and mediastinal contours are within normal limits. Left lung is clear. Mild right basilar subsegmental atelectasis or infiltrate is noted. The visualized skeletal structures are unremarkable. IMPRESSION: Mild right basilar subsegmental atelectasis or  infiltrate. Electronically Signed   By: Lupita Raider M.D.   On: 07/12/2022 12:53    Procedures Procedures    Medications Ordered in ED Medications  potassium chloride SA (KLOR-CON M) CR tablet 40 mEq (has no administration in time range)  doxycycline (VIBRA-TABS) tablet 100 mg (has no administration in time range)  ipratropium-albuterol (DUONEB) 0.5-2.5 (3) MG/3ML nebulizer solution 3 mL (3 mLs Nebulization Given 07/12/22 1219)  methylPREDNISolone sodium succinate (SOLU-MEDROL) 125 mg/2 mL injection 125 mg (125 mg Intravenous Given 07/12/22 1220)  magnesium sulfate IVPB 2 g 50 mL (0  g Intravenous Stopped 07/12/22 1331)    ED Course/ Medical Decision Making/ A&P Clinical Course as of 07/12/22 1418  Tue Jul 12, 2022  1352 Chest x-ray shows mild bibasilar atelectasis versus infiltrate [JK]  1353 CBC with Differential(!) White blood cell count decreased compared to previous [JK]  1353 Basic metabolic panel(!) Hypokalemia noted [JK]  1353 Resp panel by RT-PCR (RSV, Flu A&B, Covid) Anterior Nasal Swab(!) Positive for influenza [JK]  1417 She was treated with steroids breathing treatments magnesium prior to my evaluation.  She has noted significant improvement since then. [JK]    Clinical Course User Index [JK] Linwood Dibbles, MD                           Medical Decision Making Problems Addressed: Asthma, unspecified asthma severity, unspecified whether complicated, unspecified whether persistent: acute illness or injury Community acquired pneumonia, unspecified laterality: acute illness or injury Influenza: acute illness or injury that poses a threat to life or bodily functions Leukopenia, unspecified type: undiagnosed new problem with uncertain prognosis  Amount and/or Complexity of Data Reviewed Labs:  Decision-making details documented in ED Course.  Risk Prescription drug management.   Patient presented to the ED for evaluation of shortness of breath cough body aches.   Patient's ED workup notable for positive influenza.  Chest x-ray suggest possible infiltrate.  This may be very much viral in nature but will start on a course of antibiotics for possible bacterial pneumonia.  Patient was also wheezing on arrival.  She improved with breathing treatments.  Patient given potassium for hypokalemia.  This could very well be transient associated with her breathing treatments.  Patient is not having any hypoxia.  No respiratory distress.  She is not having high fevers.  She is tolerating p.o.  I think she appears stable for outpatient management.        Final Clinical Impression(s) / ED Diagnoses Final diagnoses:  Influenza  Community acquired pneumonia, unspecified laterality  Hypokalemia  Asthma, unspecified asthma severity, unspecified whether complicated, unspecified whether persistent  Leukopenia, unspecified type    Rx / DC Orders ED Discharge Orders          Ordered    predniSONE (DELTASONE) 50 MG tablet  Daily        07/12/22 1415    doxycycline (VIBRAMYCIN) 100 MG capsule  2 times daily        07/12/22 1415              Linwood Dibbles, MD 07/12/22 1419

## 2022-07-12 NOTE — ED Provider Triage Note (Signed)
Emergency Medicine Provider Triage Evaluation Note  Paula Fitzgerald , a 46 y.o. female  was evaluated in triage.  Pt complains of shortness of breath.  Hx of asthma.  Usually has moderate-severe exacerbations with URIs.  Recent sick contact at work.  Developed symptoms starting 2 days ago.  Feels difficulty moving air.  Also with fever, chills, cough, wheezing, and body aches.  Denies N/V/D, abdominal pain, difficulty swallowing, or dizziness.  Review of Systems  Positive:  Negative: See above  Physical Exam  BP (!) 143/99   Pulse (!) 106   Temp 99.2 F (37.3 C) (Oral)   Resp (!) 24   SpO2 95%  Gen:   Awake, no distress, uncomfortable appearing  Resp:  Midly increased effort.  Wheezing bilaterally.  Mildly reduced air movement though overall appropriate.  Equal chest rise MSK:   Moves extremities without difficulty  Other:  Abdomen soft nonTTP.  Mild tenderness of ribs  Medical Decision Making  Medically screening exam initiated at 11:32 AM.  Appropriate orders placed.  Paula Fitzgerald was informed that the remainder of the evaluation will be completed by another provider, this initial triage assessment does not replace that evaluation, and the importance of remaining in the ED until their evaluation is complete.     Paula Cobbs, PA-C 07/12/22 1134

## 2022-07-12 NOTE — Discharge Instructions (Addendum)
Take the antibiotics as prescribed.  Continue your inhalers.  Take the prednisone to help with the inflammation in your lungs. Rest and drink plenty of fluids.  You may continue to have cough and fevers associated with your influenza over the next several days.  Follow-up with your doctor next week to be rechecked to make sure you are improving and to recheck your blood tests as we discussed.  Turn to the ER for worsening symptoms shortness of breath.

## 2022-07-12 NOTE — ED Triage Notes (Signed)
Pt arrived via POV, c/o cough, SOB, exacerbation of asthma, has taken neb and inhalers with little relief. States she started feeling tighter and came to ED

## 2022-07-13 ENCOUNTER — Telehealth: Payer: Self-pay | Admitting: General Practice

## 2022-07-13 ENCOUNTER — Telehealth: Payer: Self-pay

## 2022-07-13 MED ORDER — ALBUTEROL SULFATE HFA 108 (90 BASE) MCG/ACT IN AERS: 2.0000 | INHALATION_SPRAY | Freq: Four times a day (QID) | RESPIRATORY_TRACT | 0 refills | Status: AC | PRN

## 2022-07-13 NOTE — Telephone Encounter (Signed)
Transition Care Management Follow-up Telephone Call Date of discharge and from where: 07/12/22 from [facility] How have you been since you were released from the hospital? Patient stated that she is doing ok. Does feel that she needs a refill on her inhaler. Patient scheduled to see PCP on 07/19/22. Sent message/cma to provider for refill for her inhaler. Any questions or concerns? No  Items Reviewed: Did the pt receive and understand the discharge instructions provided? Yes  Medications obtained and verified? Yes  Other? No  Any new allergies since your discharge? No  Dietary orders reviewed? Yes Do you have support at home? Yes   Home Care and Equipment/Supplies: Were home health services ordered? no  Functional Questionnaire: (I = Independent and D = Dependent) ADLs: I  Bathing/Dressing- I  Meal Prep- I  Eating- I  Maintaining continence- I  Transferring/Ambulation- I  Managing Meds- I  Follow up appointments reviewed:  PCP Hospital f/u appt confirmed? Yes  Scheduled to see Tandy Gaw, PA on 07/19/22 @ 1130. Specialist Hospital f/u appt confirmed? No   Are transportation arrangements needed? No  If their condition worsens, is the pt aware to call PCP or go to the Emergency Dept.? Yes Was the patient provided with contact information for the PCP's office or ED? Yes Was to pt encouraged to call back with questions or concerns? Yes

## 2022-07-13 NOTE — Telephone Encounter (Signed)
Sent refill of inhaler after ED visit for Covid and pneumonia.

## 2022-07-13 NOTE — Telephone Encounter (Signed)
Transition Care Management Unsuccessful Follow-up Telephone Call  Date of discharge and from where:  07/12/22 from [facility]  Attempts:  1st Attempt  Reason for unsuccessful TCM follow-up call:  Left voice message

## 2022-07-19 ENCOUNTER — Telehealth (INDEPENDENT_AMBULATORY_CARE_PROVIDER_SITE_OTHER): Payer: No Typology Code available for payment source | Admitting: Physician Assistant

## 2022-07-19 ENCOUNTER — Encounter: Payer: Self-pay | Admitting: Physician Assistant

## 2022-07-19 VITALS — Temp 97.5°F | Wt 275.0 lb

## 2022-07-19 DIAGNOSIS — J101 Influenza due to other identified influenza virus with other respiratory manifestations: Secondary | ICD-10-CM

## 2022-07-19 DIAGNOSIS — J189 Pneumonia, unspecified organism: Secondary | ICD-10-CM | POA: Diagnosis not present

## 2022-07-19 MED ORDER — AZITHROMYCIN 250 MG PO TABS: ORAL_TABLET | ORAL | 0 refills | Status: DC

## 2022-07-19 MED ORDER — PREDNISONE 20 MG PO TABS: ORAL_TABLET | ORAL | 0 refills | Status: DC

## 2022-07-19 NOTE — Progress Notes (Signed)
..Virtual Visit via Video Note  I connected with Paula Fitzgerald on 07/19/22 at 11:30 AM EST by a video enabled telemedicine application and verified that I am speaking with the correct person using two identifiers.  Location: Patient: home Provider: clinic  .Marland KitchenParticipating in visit:  Patient: Paula Fitzgerald Provider: Iran Planas PA-C   I discussed the limitations of evaluation and management by telemedicine and the availability of in person appointments. The patient expressed understanding and agreed to proceed.  History of Present Illness: Pt is a 47 yo female with asthma and HTN who presents to the clinic to follow up after ED visit for influenza A and possible infiltrate due to pneumonia. Doxycycline and prednisone were given. She has thrown up after every dose of doxycycline. She has not been able to pick up prednisone because pharmacy was out.  She does not feel much better. Her chest is tight and she feels a fullness of lungs moving into her left lungs. Her cough is very productive. She denies any fever. She is having terrible chills and hot flashes. Her albuterol inhaler is helping a lot.    Active Ambulatory Problems    Diagnosis Date Noted   BIPOLAR DISORDER UNSPECIFIED 12/23/2009   OBSTRUCTIVE SLEEP APNEA 01/22/2010   Primary hypertension 12/23/2009   ASTHMA 12/23/2009   PNA (pneumonia) 07/04/2011   Severe uncontrolled hypertension 07/11/2011   Morbid obesity ([place]) 07/11/2011   Diverticulitis of colon with perforation 03/03/2020   Tetrahydrocannabinol (THC) dependence ([place]) 09/15/2021   Recurrent low back pain 09/28/2020   Pre-diabetes 02/27/2020   Morbid obesity with BMI of 50.0-59.9, adult ([place]) 10/06/2015   History of diverticulitis 05/13/2021   Asthma 10/06/2015   Gastroesophageal reflux disease without esophagitis 09/28/2020   Hyperlipidemia 05/30/2022   Thyroid disorder screening 05/30/2022   Resolved Ambulatory Problems    Diagnosis Date Noted   No  Resolved Ambulatory Problems   Past Medical History:  Diagnosis Date   Asthma    Bipolar 1 disorder ([place])    Depressed    Diverticulitis 03/03/2020   Hypertension    Obesity    Panic attacks       Observations/Objective: Productive cough No labored breathing  .Marland Kitchen Today's Vitals   07/19/22 1126  Temp: (!) 97.5 F (36.4 C)  Weight: 275 lb (124.7 kg)   Body mass index is 48.71 kg/m.    Assessment and Plan: Marland KitchenMarland KitchenDaleiza was seen today for hospitalization follow-up.  Diagnoses and all orders for this visit:  Community acquired pneumonia of right lower lobe of lung -     azithromycin (ZITHROMAX Z-PAK) 250 MG tablet; Take 2 tablets (500 mg) on  Day 1,  followed by 1 tablet (250 mg) once daily on Days 2 through 5. -     predniSONE (DELTASONE) 20 MG tablet; Take 3 tablets for 3 days, take 2 tablets for 3 days, take 1 tablet for 3 days, take 1/2 tablet for 4 days.  Influenza A -     predniSONE (DELTASONE) 20 MG tablet; Take 3 tablets for 3 days, take 2 tablets for 3 days, take 1 tablet for 3 days, take 1/2 tablet for 4 days.   Not tolerated doxycycline Start zpak Not started prednisone due to supply issues Sent prednisone to [facility] Continue to use albuterol inhaler every 2-4 hours Rest and hydrate Delsym for cough Follow up if not improving or worsening    Follow Up Instructions:    I discussed the assessment and treatment plan with the patient. The patient was  provided an opportunity to ask questions and all were answered. The patient agreed with the plan and demonstrated an understanding of the instructions.   The patient was advised to call back or seek an in-person evaluation if the symptoms worsen or if the condition fails to improve as anticipated.    Iran Planas, PA-C

## 2022-07-22 ENCOUNTER — Ambulatory Visit: Payer: No Typology Code available for payment source | Admitting: Podiatry

## 2022-08-05 ENCOUNTER — Ambulatory Visit (INDEPENDENT_AMBULATORY_CARE_PROVIDER_SITE_OTHER): Payer: No Typology Code available for payment source | Admitting: Podiatry

## 2022-08-05 ENCOUNTER — Other Ambulatory Visit: Payer: Self-pay | Admitting: Podiatry

## 2022-08-05 ENCOUNTER — Ambulatory Visit (INDEPENDENT_AMBULATORY_CARE_PROVIDER_SITE_OTHER): Payer: No Typology Code available for payment source

## 2022-08-05 DIAGNOSIS — M25571 Pain in right ankle and joints of right foot: Secondary | ICD-10-CM

## 2022-08-05 DIAGNOSIS — S93401D Sprain of unspecified ligament of right ankle, subsequent encounter: Secondary | ICD-10-CM

## 2022-08-05 DIAGNOSIS — B351 Tinea unguium: Secondary | ICD-10-CM

## 2022-08-05 DIAGNOSIS — M7661 Achilles tendinitis, right leg: Secondary | ICD-10-CM | POA: Diagnosis not present

## 2022-08-05 NOTE — Progress Notes (Signed)
Subjective: No chief complaint on file.  48 year old female presents the office today for follow evaluation of right ankle sprain.  She has been using the cam boot.  She states that she recently just started developing pain on the Achilles tendon she points to.  She is Voltaren gel which helps.  No injuries.  No other changes.  She has not yet started the Lamisil.  Objective: AAO x3, NAD DP/PT pulses palpable bilaterally, CRT less than 3 seconds Decreased medial arch upon weightbearing bilaterally.  There is no pain in the ankle or other areas of pinpoint tenderness noted today.  She does get discomfort in the distal portion Achilles tendon on the right side but clinically the tendon appears to be intact.  No edema, erythema.  Decreased medial arch upon weightbearing bilaterally. Nails are unchanged. No pain with calf compression, swelling, warmth, erythema  Assessment: Achilles tendinitis right side, onychomycosis  Plan: -All treatment options discussed with the patient including all alternatives, risks, complications.  -X-rays were obtained and reviewed.  3 views were obtained.  Negative calcaneal inclination ankle, flatfoot. No evidence of acute fracture -Discussed that she can still transition to regular shoe as tolerated.  Dispensed toe lifts.  Discussed stretching, icing on regular basis he can continue with Voltaren as well.  Improvement consider physical therapy appearance imaging. -She can start Lamisil.  Monitor for any side effects. -Patient encouraged to call the office with any questions, concerns, change in symptoms.   Trula Slade DPM

## 2022-08-05 NOTE — Patient Instructions (Signed)
Achilles Tendinitis  with Rehab Achilles tendinitis is a disorder of the Achilles tendon. The Achilles tendon connects the large calf muscles (Gastrocnemius and Soleus) to the heel bone (calcaneus). This tendon is sometimes called the heel cord. It is important for pushing-off and standing on your toes and is important for walking, running, or jumping. Tendinitis is often caused by overuse and repetitive microtrauma. SYMPTOMS  Pain, tenderness, swelling, warmth, and redness may occur over the Achilles tendon even at rest.  Pain with pushing off, or flexing or extending the ankle.  Pain that is worsened after or during activity. CAUSES   Overuse sometimes seen with rapid increase in exercise programs or in sports requiring running and jumping.  Poor physical conditioning (strength and flexibility or endurance).  Running sports, especially training running down hills.  Inadequate warm-up before practice or play or failure to stretch before participation.  Injury to the tendon. PREVENTION   Warm up and stretch before practice or competition.  Allow time for adequate rest and recovery between practices and competition.  Keep up conditioning.  Keep up ankle and leg flexibility.  Improve or keep muscle strength and endurance.  Improve cardiovascular fitness.  Use proper technique.  Use proper equipment (shoes, skates).  To help prevent recurrence, taping, protective strapping, or an adhesive bandage may be recommended for several weeks after healing is complete. PROGNOSIS   Recovery may take weeks to several months to heal.  Longer recovery is expected if symptoms have been prolonged.  Recovery is usually quicker if the inflammation is due to a direct blow as compared with overuse or sudden strain. RELATED COMPLICATIONS   Healing time will be prolonged if the condition is not correctly treated. The injury must be given plenty of time to heal.  Symptoms can reoccur if  activity is resumed too soon.  Untreated, tendinitis may increase the risk of tendon rupture requiring additional time for recovery and possibly surgery. TREATMENT   The first treatment consists of rest anti-inflammatory medication, and ice to relieve the pain.  Stretching and strengthening exercises after resolution of pain will likely help reduce the risk of recurrence. Referral to a physical therapist or athletic trainer for further evaluation and treatment may be helpful.  A walking boot or cast may be recommended to rest the Achilles tendon. This can help break the cycle of inflammation and microtrauma.  Arch supports (orthotics) may be prescribed or recommended by your caregiver as an adjunct to therapy and rest.  Surgery to remove the inflamed tendon lining or degenerated tendon tissue is rarely necessary and has shown less than predictable results. MEDICATION   Nonsteroidal anti-inflammatory medications, such as aspirin and ibuprofen, may be used for pain and inflammation relief. Do not take within 7 days before surgery. Take these as directed by your caregiver. Contact your caregiver immediately if any bleeding, stomach upset, or signs of allergic reaction occur. Other minor pain relievers, such as acetaminophen, may also be used.  Pain relievers may be prescribed as necessary by your caregiver. Do not take prescription pain medication for longer than 4 to 7 days. Use only as directed and only as much as you need.  Cortisone injections are rarely indicated. Cortisone injections may weaken tendons and predispose to rupture. It is better to give the condition more time to heal than to use them. HEAT AND COLD  Cold is used to relieve pain and reduce inflammation for acute and chronic Achilles tendinitis. Cold should be applied for 10 to 15 minutes   every 2 to 3 hours for inflammation and pain and immediately after any activity that aggravates your symptoms. Use ice packs or an ice  massage.  Heat may be used before performing stretching and strengthening activities prescribed by your caregiver. Use a heat pack or a warm soak. SEEK MEDICAL CARE IF:  Symptoms get worse or do not improve in 2 weeks despite treatment.  New, unexplained symptoms develop. Drugs used in treatment may produce side effects.  EXERCISES:  RANGE OF MOTION (ROM) AND STRETCHING EXERCISES - Achilles Tendinitis  These exercises may help you when beginning to rehabilitate your injury. Your symptoms may resolve with or without further involvement from your physician, physical therapist or athletic trainer. While completing these exercises, remember:   Restoring tissue flexibility helps normal motion to return to the joints. This allows healthier, less painful movement and activity.  An effective stretch should be held for at least 30 seconds.  A stretch should never be painful. You should only feel a gentle lengthening or release in the stretched tissue.  STRETCH  Gastroc, Standing   Place hands on wall.  Extend right / left leg, keeping the front knee somewhat bent.  Slightly point your toes inward on your back foot.  Keeping your right / left heel on the floor and your knee straight, shift your weight toward the wall, not allowing your back to arch.  You should feel a gentle stretch in the right / left calf. Hold this position for 10 seconds. Repeat 3 times. Complete this stretch 2 times per day.  STRETCH  Soleus, Standing   Place hands on wall.  Extend right / left leg, keeping the other knee somewhat bent.  Slightly point your toes inward on your back foot.  Keep your right / left heel on the floor, bend your back knee, and slightly shift your weight over the back leg so that you feel a gentle stretch deep in your back calf.  Hold this position for 10 seconds. Repeat 3 times. Complete this stretch 2 times per day.  STRETCH  Gastrocsoleus, Standing  Note: This exercise can place  a lot of stress on your foot and ankle. Please complete this exercise only if specifically instructed by your caregiver.   Place the ball of your right / left foot on a step, keeping your other foot firmly on the same step.  Hold on to the wall or a rail for balance.  Slowly lift your other foot, allowing your body weight to press your heel down over the edge of the step.  You should feel a stretch in your right / left calf.  Hold this position for 10 seconds.  Repeat this exercise with a slight bend in your knee. Repeat 3 times. Complete this stretch 2 times per day.   STRENGTHENING EXERCISES - Achilles Tendinitis These exercises may help you when beginning to rehabilitate your injury. They may resolve your symptoms with or without further involvement from your physician, physical therapist or athletic trainer. While completing these exercises, remember:   Muscles can gain both the endurance and the strength needed for everyday activities through controlled exercises.  Complete these exercises as instructed by your physician, physical therapist or athletic trainer. Progress the resistance and repetitions only as guided.  You may experience muscle soreness or fatigue, but the pain or discomfort you are trying to eliminate should never worsen during these exercises. If this pain does worsen, stop and make certain you are following the directions exactly. If   push your toes away from you, pointing them downward. Hold this position for 10 seconds. Return slowly, controlling the tension in the band/tubing. Repeat 3 times. Complete this exercise 2 times per day.   STRENGTH - Plantar-flexors  Stand with your feet shoulder width apart. Steady yourself with a wall or table  using as little support as needed. Keeping your weight evenly spread over the width of your feet, rise up on your toes.* Hold this position for 10 seconds. Repeat 3 times. Complete this exercise 2 times per day.  *If this is too easy, shift your weight toward your right / left leg until you feel challenged. Ultimately, you may be asked to do this exercise with your right / left foot only.  STRENGTH  Plantar-flexors, Eccentric  Note: This exercise can place a lot of stress on your foot and ankle. Please complete this exercise only if specifically instructed by your caregiver.  Place the balls of your feet on a step. With your hands, use only enough support from a wall or rail to keep your balance. Keep your knees straight and rise up on your toes. Slowly shift your weight entirely to your right / left toes and pick up your opposite foot. Gently and with controlled movement, lower your weight through your right / left foot so that your heel drops below the level of the step. You will feel a slight stretch in the back of your calf at the end position. Use the healthy leg to help rise up onto the balls of both feet, then lower weight only on the right / left leg again. Build up to 15 repetitions. Then progress to 3 consecutive sets of 15 repetitions.* After completing the above exercise, complete the same exercise with a slight knee bend (about 30 degrees). Again, build up to 15 repetitions. Then progress to 3 consecutive sets of 15 repetitions.* Perform this exercise 2 times per day.  *When you easily complete 3 sets of 15, your physician, physical therapist or athletic trainer may advise you to add resistance by wearing a backpack filled with additional weight.  STRENGTH - Plantar Flexors, Seated  Sit on a chair that allows your feet to rest flat on the ground. If necessary, sit at the edge of the chair. Keeping your toes firmly on the ground, lift your right / left heel as far as you can without  increasing any discomfort in your ankle. Repeat 3 times. Complete this exercise 2 times a day.  ---  Terbinafine Tablets What is this medication? TERBINAFINE (TER bin a feen) treats fungal infections of the nails. It belongs to a group of medications called antifungals. It will not treat infections caused by bacteria or viruses. This medicine may be used for other purposes; ask your health care provider or pharmacist if you have questions. COMMON BRAND NAME(S): Lamisil, Terbinex What should I tell my care team before I take this medication? They need to know if you have any of these conditions: Liver disease An unusual or allergic reaction to terbinafine, other medications, foods, dyes, or preservatives Pregnant or trying to get pregnant Breast-feeding How should I use this medication? Take this medication by mouth with water. Take it as directed on the prescription label at the same time every day. You can take it with or without food. If it upsets your stomach, take it with food. Keep taking it unless your care team tells you to stop. A special MedGuide will be given to you by the pharmacist  with each prescription and refill. Be sure to read this information carefully each time. Talk to your care team regarding the use of this medication in children. Special care may be needed. Overdosage: If you think you have taken too much of this medicine contact a poison control center or emergency room at once. NOTE: This medicine is only for you. Do not share this medicine with others. What if I miss a dose? If you miss a dose, take it as soon as you can unless it is more than 4 hours late. If it is more than 4 hours late, skip the missed dose. Take the next dose at the normal time. What may interact with this medication? Do not take this medication with any of the following: Pimozide Thioridazine This medication may also interact with the following: Beta blockers Caffeine Certain medications  for mental health conditions Cimetidine Cyclosporine Medications for fungal infections like fluconazole and ketoconazole Medications for irregular heartbeat like amiodarone, flecainide and propafenone Rifampin Warfarin This list may not describe all possible interactions. Give your health care provider a list of all the medicines, herbs, non-prescription drugs, or dietary supplements you use. Also tell them if you smoke, drink alcohol, or use illegal drugs. Some items may interact with your medicine. What should I watch for while using this medication? Visit your care team for regular checks on your progress. You may need blood work while you are taking this medication. It may be some time before you see the benefit from this medication. This medication may cause serious skin reactions. They can happen weeks to months after starting the medication. Contact your care team right away if you notice fevers or flu-like symptoms with a rash. The rash may be red or purple and then turn into blisters or peeling of the skin. Or, you might notice a red rash with swelling of the face, lips or lymph nodes in your neck or under your arms. This medication can make you more sensitive to the sun. Keep out of the sun, If you cannot avoid being in the sun, wear protective clothing and sunscreen. Do not use sun lamps or tanning beds/booths. What side effects may I notice from receiving this medication? Side effects that you should report to your care team as soon as possible: Allergic reactions--skin rash, itching, hives, swelling of the face, lips, tongue, or throat Change in sense of smell Change in taste Infection--fever, chills, cough, or sore throat Liver injury--right upper belly pain, loss of appetite, nausea, light-colored stool, dark yellow or brown urine, yellowing skin or eyes, unusual weakness or fatigue Low red blood cell level--unusual weakness or fatigue, dizziness, headache, trouble  breathing Lupus-like syndrome--joint pain, swelling, or stiffness, butterfly-shaped rash on the face, rashes that get worse in the sun, fever, unusual weakness or fatigue Rash, fever, and swollen lymph nodes Redness, blistering, peeling, or loosening of the skin, including inside the mouth Unusual bruising or bleeding Worsening mood, feelings of depression Side effects that usually do not require medical attention (report to your care team if they continue or are bothersome): Diarrhea Gas Headache Nausea Stomach pain Upset stomach This list may not describe all possible side effects. Call your doctor for medical advice about side effects. You may report side effects to FDA at 1-800-FDA-1088. Where should I keep my medication? Keep out of the reach of children and pets. Store between 20 and 25 degrees C (68 and 77 degrees F). Protect from light. Get rid of any unused medication after the  expiration date. To get rid of medications that are no longer needed or have expired: Take the medication to a medication take-back program. Check with your pharmacy or law enforcement to find a location. If you cannot return the medication, check the label or package insert to see if the medication should be thrown out in the garbage or flushed down the toilet. If you are not sure, ask your care team. If it is safe to put it in the trash, take the medication out of the container. Mix the medication with cat litter, dirt, coffee grounds, or other unwanted substance. Seal the mixture in a bag or container. Put it in the trash. NOTE: This sheet is a summary. It may not cover all possible information. If you have questions about this medicine, talk to your doctor, pharmacist, or health care provider.  2023 Elsevier/Gold Standard (2021-01-26 00:00:00)

## 2022-09-14 ENCOUNTER — Other Ambulatory Visit: Payer: Self-pay | Admitting: Pharmacist

## 2022-09-14 NOTE — Progress Notes (Signed)
Patient appearing on report for [facility] - Hypertension Control report due to last documented ambulatory blood pressure of 143/99 on 07/12/22. Next appointment with PCP is 11/25/22   Outreached patient to discuss hypertension control and medication management. Left voicemail for patient to return my call at their convenience.   Larinda Buttery, PharmD Clinical Pharmacist [facility] [phone]

## 2022-09-28 ENCOUNTER — Other Ambulatory Visit: Payer: Self-pay | Admitting: Physician Assistant

## 2022-11-25 ENCOUNTER — Ambulatory Visit: Payer: No Typology Code available for payment source | Admitting: Physician Assistant

## 2022-11-30 ENCOUNTER — Encounter: Payer: Self-pay | Admitting: Physician Assistant

## 2022-11-30 ENCOUNTER — Ambulatory Visit (INDEPENDENT_AMBULATORY_CARE_PROVIDER_SITE_OTHER): Payer: Medicaid Other | Admitting: Physician Assistant

## 2022-11-30 ENCOUNTER — Ambulatory Visit (INDEPENDENT_AMBULATORY_CARE_PROVIDER_SITE_OTHER): Payer: Medicaid Other

## 2022-11-30 DIAGNOSIS — R051 Acute cough: Secondary | ICD-10-CM | POA: Diagnosis not present

## 2022-11-30 DIAGNOSIS — N951 Menopausal and female climacteric states: Secondary | ICD-10-CM | POA: Diagnosis not present

## 2022-11-30 DIAGNOSIS — R7303 Prediabetes: Secondary | ICD-10-CM

## 2022-11-30 DIAGNOSIS — R071 Chest pain on breathing: Secondary | ICD-10-CM

## 2022-11-30 DIAGNOSIS — I10 Essential (primary) hypertension: Secondary | ICD-10-CM

## 2022-11-30 DIAGNOSIS — Z6841 Body Mass Index (BMI) 40.0 and over, adult: Secondary | ICD-10-CM

## 2022-11-30 LAB — POCT GLYCOSYLATED HEMOGLOBIN (HGB A1C): Hemoglobin A1C: 5.1 % (ref 4.0–5.6)

## 2022-11-30 MED ORDER — VALSARTAN-HYDROCHLOROTHIAZIDE 320-25 MG PO TABS: 1.0000 | ORAL_TABLET | Freq: Every day | ORAL | 1 refills | Status: DC

## 2022-11-30 MED ORDER — VEOZAH 45 MG PO TABS: 1.0000 | ORAL_TABLET | Freq: Every day | ORAL | 11 refills | Status: DC

## 2022-11-30 MED ORDER — RYBELSUS 7 MG PO TABS: 1.0000 | ORAL_TABLET | Freq: Every day | ORAL | 1 refills | Status: DC

## 2022-11-30 NOTE — Progress Notes (Signed)
Established Patient Office Visit  Subjective   Patient ID: Paula Fitzgerald, female    DOB: June 17, 1976  Age: 47 y.o. MRN: [id number]  Chief Complaint  Patient presents with   Follow-up    HPI Pt is a 47 y.o. morbidly obese female with pre-diabetes, HTN, Asthma, GERD, HLD, OSA who presents to the clinic to follow up on medication.   She has started rybelsus and doing well. She is eating less and moving more. She took a new job at [employer] and walking at least 3000 steps a day. Pt started at 298 and 276 today.   She has had a dry cough for past few days. No fever, chills, sinus pressure, headache. She has had some intermittent CP with breathing. Not tried anything to make better. Hx of pneumonia and request CXR.   She is having terrible night sweats at night and hot flashes during the day.   .. Active Ambulatory Problems    Diagnosis Date Noted   BIPOLAR DISORDER UNSPECIFIED 12/23/2009   OBSTRUCTIVE SLEEP APNEA 01/22/2010   Primary hypertension 12/23/2009   ASTHMA 12/23/2009   PNA (pneumonia) 07/04/2011   Severe uncontrolled hypertension 07/11/2011   Morbid obesity (HCC) 07/11/2011   Diverticulitis of colon with perforation 03/03/2020   Tetrahydrocannabinol (THC) dependence (HCC) 09/15/2021   Recurrent low back pain 09/28/2020   Pre-diabetes 02/27/2020   Class 3 severe obesity due to excess calories with serious comorbidity and body mass index (BMI) of 45.0 to 49.9 in adult [facility]) 10/06/2015   History of diverticulitis 05/13/2021   Asthma 10/06/2015   Gastroesophageal reflux disease without esophagitis 09/28/2020   Hyperlipidemia 05/30/2022   Thyroid disorder screening 05/30/2022   Menopausal syndrome (hot flashes) 11/30/2022   Resolved Ambulatory Problems    Diagnosis Date Noted   No Resolved Ambulatory Problems   Past Medical History:  Diagnosis Date   Asthma    Bipolar 1 disorder (HCC)    Depressed    Diverticulitis 03/03/2020   Hypertension     Obesity    Panic attacks      ROS   See HPI.  Objective:     BP (!) 142/82   Pulse 75   Ht 5\' 3"  (1.6 m)   Wt 276 lb (125.2 kg)   SpO2 99%   BMI 48.89 kg/m  BP Readings from Last 3 Encounters:  11/30/22 (!) 142/82  07/12/22 126/62  05/27/22 (!) 152/95   Wt Readings from Last 3 Encounters:  11/30/22 276 lb (125.2 kg)  07/19/22 275 lb (124.7 kg)  05/27/22 298 lb 0.6 oz (135.2 kg)    .Marland Kitchen Results for orders placed or performed in visit on 11/30/22  POCT HgB A1C  Result Value Ref Range   Hemoglobin A1C 5.1 4.0 - 5.6 %   HbA1c POC (<> result, manual entry)     HbA1c, POC (prediabetic range)     HbA1c, POC (controlled diabetic range)       Physical Exam Constitutional:      Appearance: Normal appearance. She is obese.  HENT:     Head: Normocephalic.  Cardiovascular:     Rate and Rhythm: Normal rate and regular rhythm.     Pulses: Normal pulses.     Heart sounds: Normal heart sounds.  Pulmonary:     Effort: Pulmonary effort is normal.     Breath sounds: Normal breath sounds.  Musculoskeletal:     Right lower leg: Edema present.     Left lower leg: Edema present.  Comments: Minimal non pitting edema of bilateral ankles  Neurological:     General: No focal deficit present.     Mental Status: She is alert and oriented to person, place, and time.  Psychiatric:        Mood and Affect: Mood normal.         The 10-year ASCVD risk score (Arnett DK, et al., 2019) is: 5.1%    Assessment & Plan:  Marland KitchenMarland KitchenAllexia was seen today for follow-up.  Diagnoses and all orders for this visit:  Class 3 severe obesity due to excess calories with serious comorbidity and body mass index (BMI) of 45.0 to 49.9 in adult (HCC) -     Semaglutide (RYBELSUS) 7 MG TABS; Take 1 tablet (7 mg total) by mouth daily.  Pre-diabetes -     POCT HgB A1C -     Semaglutide (RYBELSUS) 7 MG TABS; Take 1 tablet (7 mg total) by mouth daily.  Menopausal syndrome (hot flashes) -      Fezolinetant (VEOZAH) 45 MG TABS; Take 1 tablet (45 mg total) by mouth daily.  Chest pain varying with breathing -     DG Chest 2 View; Future  Acute cough -     DG Chest 2 View; Future  Primary hypertension -     valsartan-hydrochlorothiazide (DIOVAN-HCT) 320-25 MG tablet; Take 1 tablet by mouth daily.   Down 22lbs Continue to work on weight loss with diet and exercise Continue on rybelsus A1C looks great today at 5.1 and down from 6.0.   BP not to goal Continue diovan-HCTZ but did not take this morning because she was out  Lungs sound good Cough could be pollen related Use albuterol as needed CXR stat no abnormalities Follow up as needed  Discussed hot flashes Consider veozah  Printed rx with coupon card  Follow up in 49 months.      Tandy Gaw, PA-C

## 2022-11-30 NOTE — Progress Notes (Signed)
Normal CXR. Looks great.

## 2023-02-14 ENCOUNTER — Telehealth: Payer: Medicaid Other | Admitting: Physician Assistant

## 2023-02-14 ENCOUNTER — Telehealth (INDEPENDENT_AMBULATORY_CARE_PROVIDER_SITE_OTHER): Payer: Medicaid Other | Admitting: Physician Assistant

## 2023-02-14 DIAGNOSIS — J329 Chronic sinusitis, unspecified: Secondary | ICD-10-CM | POA: Diagnosis not present

## 2023-02-14 DIAGNOSIS — J40 Bronchitis, not specified as acute or chronic: Secondary | ICD-10-CM | POA: Diagnosis not present

## 2023-02-14 MED ORDER — METHYLPREDNISOLONE 4 MG PO TBPK: ORAL_TABLET | ORAL | 0 refills | Status: DC

## 2023-02-14 NOTE — Telephone Encounter (Signed)
Pt was with husband in virtual visit. She has worse cough, congestion, sinus pressure. She is taking mucinex D. No fever, chills, body aches. On day 2 of symptoms. She does have asthma and having more chest tightness.   Sinobronchitis-likely viral  Use albuterol as needed every 3-6 hours.  Medrol dose pack sent to pharmacy Continue mucinex D.  Flonase for nasal congestion/sinus pressure Follow up in office if not improving or worsening.

## 2023-02-25 ENCOUNTER — Other Ambulatory Visit: Payer: Self-pay | Admitting: Podiatry

## 2023-04-05 ENCOUNTER — Other Ambulatory Visit: Payer: Self-pay | Admitting: Physician Assistant

## 2023-04-05 DIAGNOSIS — I10 Essential (primary) hypertension: Secondary | ICD-10-CM

## 2023-04-10 NOTE — Telephone Encounter (Signed)
Pt called.  She is following up on refill of Valsartan.

## 2023-05-30 ENCOUNTER — Ambulatory Visit (INDEPENDENT_AMBULATORY_CARE_PROVIDER_SITE_OTHER): Payer: Medicaid Other | Admitting: Physician Assistant

## 2023-05-30 ENCOUNTER — Encounter: Payer: Self-pay | Admitting: Physician Assistant

## 2023-05-30 VITALS — BP 135/85 | HR 67 | Ht 63.0 in | Wt 274.8 lb

## 2023-05-30 DIAGNOSIS — M5442 Lumbago with sciatica, left side: Secondary | ICD-10-CM | POA: Diagnosis not present

## 2023-05-30 MED ORDER — PREDNISONE 50 MG PO TABS: ORAL_TABLET | ORAL | 0 refills | Status: DC

## 2023-05-30 MED ORDER — NAPROXEN 500 MG PO TABS: 500.0000 mg | ORAL_TABLET | Freq: Two times a day (BID) | ORAL | 0 refills | Status: DC

## 2023-05-30 MED ORDER — METHOCARBAMOL 500 MG PO TABS: 500.0000 mg | ORAL_TABLET | Freq: Three times a day (TID) | ORAL | 0 refills | Status: AC

## 2023-05-30 MED ORDER — KETOROLAC TROMETHAMINE 60 MG/2ML IM SOLN
60.0000 mg | Freq: Once | INTRAMUSCULAR | Status: AC
  Administered 2023-05-30: 60 mg via INTRAMUSCULAR

## 2023-05-30 NOTE — Patient Instructions (Signed)

## 2023-05-30 NOTE — Progress Notes (Signed)
Acute Office Visit  Subjective:     Patient ID: Paula Fitzgerald, female    DOB: [birth year], 47 y.o.   MRN: [id number]  Chief Complaint  Patient presents with   Sciatica    Left side sciatica pain also burning sensation at the back of neck area ongoing for 2-3 months has gotten worse in last 1-2 wks    HPI Patient is in today for left sided low back pain that radiates into her posterior left leg and upper right sided back pain and burning for the last 2 weeks. She admits she has been working 110 hours a week and doing very physical work. She is doing a lot of bending, lifting and twisting. No known injury. She is not really doing anything to make better but becoming harder to walk recently. She has some sharp shooting pain when she goes to take a step with her left leg. No decrease in lower ext strength. No bowel or bladder dysfunction.   .. Active Ambulatory Problems    Diagnosis Date Noted   BIPOLAR DISORDER UNSPECIFIED 12/23/2009   OBSTRUCTIVE SLEEP APNEA 01/22/2010   Primary hypertension 12/23/2009   Asthma 12/23/2009   PNA (pneumonia) 07/04/2011   Severe uncontrolled hypertension 07/11/2011   Morbid obesity (HCC) 07/11/2011   Diverticulitis of colon with perforation 03/03/2020   Tetrahydrocannabinol (THC) dependence (HCC) 09/15/2021   Recurrent low back pain 09/28/2020   Pre-diabetes 02/27/2020   Class 3 severe obesity due to excess calories with serious comorbidity and body mass index (BMI) of 45.0 to 49.9 in adult [facility]) 10/06/2015   History of diverticulitis 05/13/2021   Asthma 10/06/2015   Gastroesophageal reflux disease without esophagitis 09/28/2020   Hyperlipidemia 05/30/2022   Thyroid disorder screening 05/30/2022   Menopausal syndrome (hot flashes) 11/30/2022   Acute left-sided low back pain with left-sided sciatica 05/30/2023   Resolved Ambulatory Problems    Diagnosis Date Noted   No Resolved Ambulatory Problems   Past Medical History:   Diagnosis Date   Asthma    Bipolar 1 disorder (HCC)    Depressed    Diverticulitis 03/03/2020   Hypertension    Obesity    Panic attacks      ROS  See HPI.     Objective:    BP 135/85 (BP Location: Left Arm, Patient Position: Sitting, Cuff Size: Large)   Pulse 67   Ht 5\' 3"  (1.6 m)   Wt 274 lb 12 oz (124.6 kg)   SpO2 99%   BMI 48.67 kg/m  BP Readings from Last 3 Encounters:  05/30/23 135/85  11/30/22 (!) 142/82  07/12/22 126/62   Wt Readings from Last 3 Encounters:  05/30/23 274 lb 12 oz (124.6 kg)  11/30/22 276 lb (125.2 kg)  07/19/22 275 lb (124.7 kg)      Physical Exam Constitutional:      Appearance: Normal appearance. She is obese.  HENT:     Head: Normocephalic.  Cardiovascular:     Rate and Rhythm: Normal rate and regular rhythm.  Pulmonary:     Effort: Pulmonary effort is normal.  Musculoskeletal:     Right lower leg: No edema.     Left lower leg: No edema.     Comments: Pain with ROM at waist NROM at left hip.  Tenderness over left buttocks to palpation but no tenderness over lumbar spine.  Negative SLR, bilaterally.  5/5 strength of lower extremity.    Neurological:     General: No focal deficit present.  Mental Status: She is alert and oriented to person, place, and time.          Assessment & Plan:  Marland KitchenMarland KitchenLenea was seen today for sciatica.  Diagnoses and all orders for this visit:  Acute left-sided low back pain with left-sided sciatica -     naproxen (NAPROSYN) 500 MG tablet; Take 1 tablet (500 mg total) by mouth 2 (two) times daily with a meal. -     predniSONE (DELTASONE) 50 MG tablet; One tab PO daily for 5 days. -     methocarbamol (ROBAXIN) 500 MG tablet; Take 1 tablet (500 mg total) by mouth 3 (three) times daily. -     ketorolac (TORADOL) injection 60 mg   No red flag symptoms of back pain today Suspect sciatica inflamed by piriformis and gluteal tightness Could be some SI joint inflammation Shot of toradol given  today Start prednisone burst for 5 days Robaxin given to use as needed Encouraged icing and heating pad with tens unit Exercises given in HO form to start Naproxsyn to start when prednisone is finished if needed.  Written out of work for today and tomorrow.  Encouraged pt to work on her bending/lifting/twisting mechanics Follow up as needed if symptoms persist or worsen  Tandy Gaw, PA-C

## 2023-06-02 ENCOUNTER — Encounter: Payer: Self-pay | Admitting: Physician Assistant

## 2023-06-12 ENCOUNTER — Ambulatory Visit: Payer: No Typology Code available for payment source | Admitting: Physician Assistant

## 2023-06-14 ENCOUNTER — Ambulatory Visit (INDEPENDENT_AMBULATORY_CARE_PROVIDER_SITE_OTHER): Payer: Medicaid Other | Admitting: Physician Assistant

## 2023-06-14 VITALS — BP 139/79 | HR 73 | Temp 98.3°F | Resp 20 | Ht 63.0 in | Wt 275.0 lb

## 2023-06-14 DIAGNOSIS — E782 Mixed hyperlipidemia: Secondary | ICD-10-CM

## 2023-06-14 DIAGNOSIS — I10 Essential (primary) hypertension: Secondary | ICD-10-CM

## 2023-06-14 DIAGNOSIS — E66813 Obesity, class 3: Secondary | ICD-10-CM | POA: Diagnosis not present

## 2023-06-14 DIAGNOSIS — R7303 Prediabetes: Secondary | ICD-10-CM | POA: Diagnosis not present

## 2023-06-14 DIAGNOSIS — Z6841 Body Mass Index (BMI) 40.0 and over, adult: Secondary | ICD-10-CM

## 2023-06-14 LAB — POCT GLYCOSYLATED HEMOGLOBIN (HGB A1C): Hemoglobin A1C: 5.5 % (ref 4.0–5.6)

## 2023-06-14 MED ORDER — RYBELSUS 7 MG PO TABS: 1.0000 | ORAL_TABLET | Freq: Every day | ORAL | 1 refills | Status: DC

## 2023-06-14 MED ORDER — ATORVASTATIN CALCIUM 40 MG PO TABS: 40.0000 mg | ORAL_TABLET | Freq: Every day | ORAL | 3 refills | Status: AC

## 2023-06-14 MED ORDER — VALSARTAN-HYDROCHLOROTHIAZIDE 320-25 MG PO TABS: 1.0000 | ORAL_TABLET | Freq: Every day | ORAL | 1 refills | Status: DC

## 2023-06-14 NOTE — Progress Notes (Signed)
Established Patient Office Visit  Subjective   Patient ID: Paula Fitzgerald, female    DOB: [birth year]  Age: 47 y.o. MRN: [id number]  Chief Complaint  Patient presents with   Medical Management of Chronic Issues   Diabetes    HPI Pt is a 47 yo obese female with Pre-diabetes, HTN, HLD who presents to the clinic for medication refills.   She is doing well today. No concerns. She is taking rybelsus and doing great. Losing weight and keeping sugars controlled. Not checking sugars at home. She is very active getting 8000 steps a day. She works multiple jobs. No CP, palpitations, headaches or vision changes. She takes BP medication daily. She does not check BP at home.   Review of Systems  All other systems reviewed and are negative.     Objective:     BP 139/79   Pulse 73   Temp 98.3 F (36.8 C)   Resp 20   Ht 5\' 3"  (1.6 m)   Wt 275 lb 0.6 oz (124.8 kg)   SpO2 100%   BMI 48.72 kg/m  BP Readings from Last 3 Encounters:  06/14/23 139/79  05/30/23 135/85  11/30/22 (!) 142/82   Wt Readings from Last 3 Encounters:  06/14/23 275 lb 0.6 oz (124.8 kg)  05/30/23 274 lb 12 oz (124.6 kg)  11/30/22 276 lb (125.2 kg)    .Paula Kitchen Results for orders placed or performed in visit on 06/14/23  POCT HgB A1C  Result Value Ref Range   Hemoglobin A1C 5.5 4.0 - 5.6 %   HbA1c POC (<> result, manual entry)     HbA1c, POC (prediabetic range)     HbA1c, POC (controlled diabetic range)       Physical Exam Constitutional:      Appearance: Normal appearance. She is obese.  HENT:     Head: Normocephalic.  Cardiovascular:     Rate and Rhythm: Normal rate and regular rhythm.     Heart sounds: Normal heart sounds.  Pulmonary:     Effort: Pulmonary effort is normal.     Breath sounds: Normal breath sounds.  Musculoskeletal:     Cervical back: Normal range of motion and neck supple.     Right lower leg: No edema.     Left lower leg: No edema.  Neurological:     General: No focal  deficit present.     Mental Status: She is alert and oriented to person, place, and time.  Psychiatric:        Mood and Affect: Mood normal.       The 10-year ASCVD risk score (Arnett DK, et al., 2019) is: 4.6%    Assessment & Plan:  Paula Fitzgerald was seen today for medical management of chronic issues and diabetes.  Diagnoses and all orders for this visit:  Pre-diabetes -     POCT HgB A1C -     Semaglutide (RYBELSUS) 7 MG TABS; Take 1 tablet (7 mg total) by mouth daily.  Primary hypertension -     valsartan-hydrochlorothiazide (DIOVAN-HCT) 320-25 MG tablet; Take 1 tablet by mouth daily. -     CMP14+EGFR  Class 3 severe obesity due to excess calories with serious comorbidity and body mass index (BMI) of 45.0 to 49.9 in adult (HCC) -     Semaglutide (RYBELSUS) 7 MG TABS; Take 1 tablet (7 mg total) by mouth daily.  Morbid obesity (HCC) -     Lipid panel -     CMP14+EGFR  Mixed  hyperlipidemia -     atorvastatin (LIPITOR) 40 MG tablet; Take 1 tablet (40 mg total) by mouth daily. -     Lipid panel   A1C to goal Continue on rybelsus Continue to work on diet and weight loss Fasting labs ordered BP under 140/90     Return in about 6 months (around 12/12/2023).    Tandy Gaw, PA-C

## 2023-06-19 ENCOUNTER — Encounter: Payer: Self-pay | Admitting: Physician Assistant

## 2023-07-07 ENCOUNTER — Telehealth: Payer: Self-pay

## 2023-07-07 NOTE — Telephone Encounter (Signed)
Copied from CRM [id number]. Topic: Clinical - Prescription Issue >> Jul 07, 2023 12:19 PM Elle L wrote: Reason for CRM: The patient needs a new prior authorization for her Semaglutide (RYBELSUS) 7 MG TABS.

## 2023-10-12 NOTE — Telephone Encounter (Signed)
 Prior auth for: RYBELSUS Determination: Pending Auth #: [id number] Valid from: n/a

## 2023-11-27 ENCOUNTER — Ambulatory Visit (INDEPENDENT_AMBULATORY_CARE_PROVIDER_SITE_OTHER)

## 2023-11-27 ENCOUNTER — Encounter (HOSPITAL_COMMUNITY): Payer: Self-pay | Admitting: Emergency Medicine

## 2023-11-27 ENCOUNTER — Ambulatory Visit (HOSPITAL_COMMUNITY)
Admission: EM | Admit: 2023-11-27 | Discharge: 2023-11-27 | Disposition: A | Discharge status: Home / Self Care | Attending: Physician Assistant | Admitting: Physician Assistant

## 2023-11-27 DIAGNOSIS — S60211A Contusion of right wrist, initial encounter: Secondary | ICD-10-CM

## 2023-11-27 DIAGNOSIS — S60221A Contusion of right hand, initial encounter: Secondary | ICD-10-CM

## 2023-11-27 DIAGNOSIS — M79643 Pain in unspecified hand: Secondary | ICD-10-CM

## 2023-11-27 DIAGNOSIS — M5442 Lumbago with sciatica, left side: Secondary | ICD-10-CM

## 2023-11-27 MED ORDER — NAPROXEN 500 MG PO TABS: 500.0000 mg | ORAL_TABLET | Freq: Two times a day (BID) | ORAL | 0 refills | Status: AC

## 2023-11-27 NOTE — ED Provider Notes (Signed)
 [facility]    CSN: [id number] Arrival date & time: 11/27/23  1316      History   Chief Complaint Chief Complaint  Patient presents with   Wrist Pain    HPI Paula Fitzgerald is a 48 y.o. female.   Patient presents today with a several hour history of right hand and wrist pain.  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  As a result she hit her right hand/wrist and has had ongoing pain since that time.  She had immediate swelling over her dorsal wrist and so began icing this which has improved the swelling but she continues to have pain.  She reports that the pain is rated 5 on a 0-10 pain scale but increases to 7/8 with certain movements or activities, described as intense aching with periodic sharp pain, no alleviating factors identified.  She does report some intermittent paresthesias in her fingers.  Denies any numbness.  She is right-hand dominant.  Denies previous injury or surgery involving her hand.  She has tried Tylenol  without improvement of symptoms.  She is confident she is not pregnant.    Past Medical History:  Diagnosis Date   Asthma    Bipolar 1 disorder (HCC)    Depressed    Diverticulitis 03/03/2020   Hypertension    Obesity    Panic attacks     Patient Active Problem List   Diagnosis Date Noted   Acute left-sided low back pain with left-sided sciatica 05/30/2023   Menopausal syndrome (hot flashes) 11/30/2022   Hyperlipidemia 05/30/2022   Thyroid disorder screening 05/30/2022   Tetrahydrocannabinol (THC) dependence (HCC) 09/15/2021   History of diverticulitis 05/13/2021   Recurrent low back pain 09/28/2020   Gastroesophageal reflux disease without esophagitis 09/28/2020   Diverticulitis of colon with perforation 03/03/2020   Pre-diabetes 02/27/2020   Class 3 severe obesity due to excess calories with serious comorbidity and body mass index (BMI) of 45.0 to 49.9 in  adult 10/06/2015   Asthma 10/06/2015   Severe uncontrolled hypertension 07/11/2011   Morbid obesity (HCC) 07/11/2011   PNA (pneumonia) 07/04/2011   OBSTRUCTIVE SLEEP APNEA 01/22/2010   BIPOLAR DISORDER UNSPECIFIED 12/23/2009   Primary hypertension 12/23/2009   Asthma 12/23/2009    Past Surgical History:  Procedure Laterality Date   SIGMOIDECTOMY     TUBAL LIGATION      OB History   No obstetric history on file.      Home Medications    Prior to Admission medications   Medication Sig Start Date End Date Taking? Authorizing Provider  acetaminophen  (TYLENOL ) 500 MG tablet Take 1,000 mg by mouth every 6 (six) hours as needed (for pain).    [provider]  albuterol  (VENTOLIN  HFA) 108 (90 Base) MCG/ACT inhaler Inhale 2 puffs into the lungs every 6 (six) hours as needed. 07/13/22   Breeback, Jade L, PA-C  aspirin-acetaminophen -caffeine (PAMPRIN MAX) 250-250-65 MG tablet Take 1 tablet by mouth every 6 (six) hours as needed (for pain).    [provider]  atorvastatin  (LIPITOR) 40 MG tablet Take 1 tablet (40 mg total) by mouth daily. 06/14/23   Breeback, Jade L, PA-C  Fezolinetant  (VEOZAH ) 45 MG TABS Take 1 tablet (45 mg total) by mouth daily. 11/30/22   Breeback, Jade L, PA-C  methocarbamol  (ROBAXIN ) 500 MG tablet Take 1 tablet (500 mg total) by mouth 3 (three) times daily. 05/30/23   Breeback,  Jade L, PA-C  Multiple Vitamin (QUINTABS) TABS Take 1 tablet by mouth daily.    [provider]  naproxen  (NAPROSYN ) 500 MG tablet Take 1 tablet (500 mg total) by mouth 2 (two) times daily with a meal. 11/27/23   Mckinsey Keagle K, PA-C  polyethylene glycol (MIRALAX  / GLYCOLAX ) 17 g packet Take 17 g by mouth daily as needed for mild constipation or moderate constipation. 03/07/20   Charlott Converse, PA-C  Semaglutide  (RYBELSUS ) 7 MG TABS Take 1 tablet (7 mg total) by mouth daily. 06/14/23   Breeback, Jade L, PA-C  valsartan -hydrochlorothiazide  (DIOVAN -HCT) 320-25 MG  tablet Take 1 tablet by mouth daily. 06/14/23   Araceli Knight, PA-C    Family History Family History  Problem Relation Age of Onset   Breast cancer Maternal Aunt     Social History Social History   Tobacco Use   Smoking status: Former    Current packs/day: 0.00    Types: Cigarettes    Quit date: 10/02/2018    Years since quitting: 5.1   Smokeless tobacco: Never  Vaping Use   Vaping status: Never Used  Substance Use Topics   Alcohol use: Yes    Comment: sociallly   Drug use: Yes    Frequency: 3.0 times per week    Types: Marijuana     Allergies   Doxycycline  and Other   Review of Systems Review of Systems  Constitutional:  Positive for activity change. Negative for appetite change, fatigue and fever.  Musculoskeletal:  Positive for arthralgias. Negative for myalgias.  Skin:  Negative for color change and wound.  Neurological:  Negative for weakness and numbness.     Physical Exam Triage Vital Signs ED Triage Vitals  Encounter Vitals Group     BP 11/27/23 1417 (!) 154/103     Systolic BP Percentile --      Diastolic BP Percentile --      Pulse Rate 11/27/23 1417 63     Resp 11/27/23 1417 19     Temp 11/27/23 1417 97.9 F (36.6 C)     Temp Source 11/27/23 1417 Oral     SpO2 11/27/23 1417 97 %     Weight --      Height --      Head Circumference --      Peak Flow --      Pain Score 11/27/23 1416 5     Pain Loc --      Pain Education --      Exclude from Growth Chart --    No data found.  Updated Vital Signs BP (!) 154/103 (BP Location: Left Arm)   Pulse 63   Temp 97.9 F (36.6 C) (Oral)   Resp 19   SpO2 97%   Visual Acuity Right Eye Distance:   Left Eye Distance:   Bilateral Distance:    Right Eye Near:   Left Eye Near:    Bilateral Near:     Physical Exam Vitals reviewed.  Constitutional:      General: She is awake. She is not in acute distress.    Appearance: Normal appearance. She is well-developed. She is not ill-appearing.      Comments: Very pleasant female appears stated age in no acute distress sitting comfortably in exam room  HENT:     Head: Normocephalic and atraumatic.  Cardiovascular:     Rate and Rhythm: Normal rate and regular rhythm.     Heart sounds: Normal heart sounds, S1 normal  and S2 normal. No murmur heard.    Comments: Capillary refill within 2 seconds right fingers Pulmonary:     Effort: Pulmonary effort is normal.     Breath sounds: Normal breath sounds. No wheezing, rhonchi or rales.     Comments: Clear to auscultation bilaterally Musculoskeletal:     Right wrist: Swelling, tenderness and snuff box tenderness present. No bony tenderness. Decreased range of motion.     Right hand: Bony tenderness present. No swelling or tenderness. Normal range of motion. There is no disruption of two-point discrimination. Normal capillary refill.     Comments: Right hand/wrist: Tender to palpation over base of 3rd and 4th metacarpal without deformity but with associated swelling.  Mild snuffbox tenderness.  Hand is neurovascularly intact based on 2-point discrimination.  Decreased range of motion with rotation at the wrist secondary to pain.  Psychiatric:        Behavior: Behavior is cooperative.      UC Treatments / Results  Labs (all labs ordered are listed, but only abnormal results are displayed) Labs Reviewed - No data to display  EKG   Radiology No results found.  Procedures Procedures (including critical care time)  Medications Ordered in UC Medications - No data to display  Initial Impression / Assessment and Plan / UC Course  I have reviewed the triage vital signs and the nursing notes.  Pertinent labs & imaging results that were available during my care of the patient were reviewed by me and considered in my medical decision making (see chart for details).     Patient is well-appearing, afebrile, nontoxic, nontachycardic.  Hand is neurovascularly intact.  X-ray of hand and wrist  was obtained that showed no acute osseous abnormality but did show potential ganglion cyst that had associated calcification on hand lateral view and we discussed that this could be contributing to her symptoms.  At the time of discharge we will waiting for radiologist to review and we will contact her if this differs and changes her treatment plan. Given she had snuffbox tenderness she was placed in a thumb spica and encouraged to follow-up with a hand specialist for repeat x-rays.  She was given the contact information and encouraged to call to schedule appointment as soon as possible.  She was given Naprosyn  for pain relief and we discussed that she is not to take NSAIDs with this medication and risk of GI bleeding.  She can use Tylenol /acetaminophen  for additional pain relief.  We discussed that if she has any worsening symptoms including increasing pain, swelling, numbness or paresthesias, discoloration of her fingers, cold sensation of her finger she should be seen emergently.  Strict return precautions given.  Excuse note provided.  Final Clinical Impressions(s) / UC Diagnoses   Final diagnoses:  Contusion of multiple sites of right hand and wrist, initial encounter  Tenderness of anatomical snuffbox     Discharge Instructions      I do not see anything broken on your x-rays but it is possible that you have a cyst over one of the nerves that is contributing to your pain.  If the radiologist sees something else I will call you.  Because you are having pain at the base of your thumb I would like you to follow-up with a hand specialist.  Call them to schedule an appointment.  Take Naprosyn  for pain relief.  Do not take NSAIDs with this medication including aspirin, ibuprofen /Advil , naproxen /Aleve .  If anything worsens you have increasing pain, swelling, numbness  or tingling in your hands, discoloration of your hands, cold sensation in your fingers you need to go to the ER.   ED Prescriptions      Medication Sig Dispense Auth. Provider   naproxen  (NAPROSYN ) 500 MG tablet Take 1 tablet (500 mg total) by mouth 2 (two) times daily with a meal. 20 tablet Lancelot Alyea K, PA-C      PDMP not reviewed this encounter.   Budd Cargo, PA-C 11/27/23 1540

## 2023-11-27 NOTE — ED Notes (Signed)
Ortho tech made aware of splint ordered

## 2023-11-27 NOTE — Discharge Instructions (Signed)
 I do not see anything broken on your x-rays but it is possible that you have a cyst over one of the nerves that is contributing to your pain.  If the radiologist sees something else I will call you.  Because you are having pain at the base of your thumb I would like you to follow-up with a hand specialist.  Call them to schedule an appointment.  Take Naprosyn  for pain relief.  Do not take NSAIDs with this medication including aspirin, ibuprofen /Advil , naproxen /Aleve .  If anything worsens you have increasing pain, swelling, numbness or tingling in your hands, discoloration of your hands, cold sensation in your fingers you need to go to the ER.

## 2023-11-27 NOTE — ED Triage Notes (Signed)
 Pt reports that right wrist got hurt on a cash register today.

## 2023-12-12 ENCOUNTER — Ambulatory Visit: Payer: Medicaid Other | Admitting: Physician Assistant

## 2023-12-16 LAB — CMP14+EGFR
ALT: 15 IU/L (ref 0–32)
AST: 13 IU/L (ref 0–40)
Albumin: 4.2 g/dL (ref 3.9–4.9)
Alkaline Phosphatase: 109 IU/L (ref 44–121)
BUN/Creatinine Ratio: 23 (ref 9–23)
BUN: 18 mg/dL (ref 6–24)
Bilirubin Total: 0.6 mg/dL (ref 0.0–1.2)
CO2: 22 mmol/L (ref 20–29)
Calcium: 9.4 mg/dL (ref 8.7–10.2)
Chloride: 104 mmol/L (ref 96–106)
Creatinine, Ser: 0.79 mg/dL (ref 0.57–1.00)
Globulin, Total: 2.7 g/dL (ref 1.5–4.5)
Glucose: 83 mg/dL (ref 70–99)
Potassium: 4.5 mmol/L (ref 3.5–5.2)
Sodium: 141 mmol/L (ref 134–144)
Total Protein: 6.9 g/dL (ref 6.0–8.5)
eGFR: 92 mL/min/{1.73_m2} (ref >59)

## 2023-12-16 LAB — LIPID PANEL
Chol/HDL Ratio: 2.4 ratio (ref 0.0–4.4)
Cholesterol, Total: 178 mg/dL (ref 100–199)
HDL: 74 mg/dL (ref >39)
LDL Chol Calc (NIH): 95 mg/dL (ref 0–99)
Triglycerides: 42 mg/dL (ref 0–149)
VLDL Cholesterol Cal: 9 mg/dL (ref 5–40)

## 2023-12-18 ENCOUNTER — Ambulatory Visit: Payer: Self-pay | Admitting: Physician Assistant

## 2023-12-18 NOTE — Progress Notes (Signed)
 Cholesterol not quite to goal of under 70 but overall looks good. Are you taking the lipitor?  Potassium improved from last visit.  Kidney and liver function good.

## 2023-12-22 ENCOUNTER — Ambulatory Visit (INDEPENDENT_AMBULATORY_CARE_PROVIDER_SITE_OTHER): Admitting: Physician Assistant

## 2023-12-22 VITALS — BP 134/80 | HR 88 | Temp 98.6°F | Ht 63.0 in | Wt 289.0 lb

## 2023-12-22 DIAGNOSIS — I10 Essential (primary) hypertension: Secondary | ICD-10-CM

## 2023-12-22 DIAGNOSIS — Z23 Encounter for immunization: Secondary | ICD-10-CM | POA: Diagnosis not present

## 2023-12-22 DIAGNOSIS — N951 Menopausal and female climacteric states: Secondary | ICD-10-CM | POA: Diagnosis not present

## 2023-12-22 DIAGNOSIS — F419 Anxiety disorder, unspecified: Secondary | ICD-10-CM

## 2023-12-22 DIAGNOSIS — R7303 Prediabetes: Secondary | ICD-10-CM | POA: Diagnosis not present

## 2023-12-22 DIAGNOSIS — Z6841 Body Mass Index (BMI) 40.0 and over, adult: Secondary | ICD-10-CM

## 2023-12-22 DIAGNOSIS — E66813 Obesity, class 3: Secondary | ICD-10-CM

## 2023-12-22 DIAGNOSIS — T753XXD Motion sickness, subsequent encounter: Secondary | ICD-10-CM

## 2023-12-22 LAB — POCT GLYCOSYLATED HEMOGLOBIN (HGB A1C): Hemoglobin A1C: 5.3 % (ref 4.0–5.6)

## 2023-12-22 MED ORDER — CLONAZEPAM 0.5 MG PO TABS: 0.5000 mg | ORAL_TABLET | Freq: Two times a day (BID) | ORAL | 0 refills | Status: AC | PRN

## 2023-12-22 MED ORDER — SCOPOLAMINE 1 MG/3DAYS TD PT72SCOPOLAMINE 1 MG/3DAYS
1.0000 | MEDICATED_PATCH | TRANSDERMAL | 0 refills | Status: AC
Start: 2023-12-22 — End: ?

## 2023-12-22 MED ORDER — VALSARTAN-HYDROCHLOROTHIAZIDE 320-25 MG PO TABS: 1.0000 | ORAL_TABLET | Freq: Every day | ORAL | 1 refills | Status: AC

## 2023-12-22 MED ORDER — RYBELSUS 14 MG PO TABS
14.0000 mg | ORAL_TABLET | Freq: Every day | ORAL | 1 refills | Status: DC
Start: 2023-12-22 — End: 2024-04-24

## 2023-12-22 NOTE — Progress Notes (Signed)
 Established Patient Office Visit  Subjective   Patient ID: Paula Fitzgerald, female    DOB: 11/18/1975  Age: 48 y.o. MRN: [id number]  Chief Complaint  Patient presents with   Medical Management of Chronic Issues    14mo    HPI Pt is a 48 yo obese female with pre-diabetes, HTN, HLD who presents to the clinic for medication refills and follow up.   Pt is taking her BP medication daily without concerns or problems. She denies any CP, palpitations, headaches or vision changes.   She is going on a cruise and worried about motion sickeness and anxiety. She would like something to take for both.   She is not taking veozah  for hot flashes due to concern for side effects.   She is on rybelsus  for pre-diabetes, HTN, obesity. She has not recently lost any weight. She is staying very active. She is on 7mg  and would like to increase dose. She tolerates medication well.   Review of Systems  All other systems reviewed and are negative.     Objective:     BP 134/80   Pulse 88   Temp 98.6 F (37 C) (Oral)   Ht 5\' 3"  (1.6 m)   Wt 289 lb (131.1 kg)   SpO2 99%   BMI 51.19 kg/m  BP Readings from Last 3 Encounters:  12/22/23 134/80  11/27/23 (!) 154/103  06/14/23 139/79   Wt Readings from Last 3 Encounters:  12/22/23 289 lb (131.1 kg)  06/14/23 275 lb 0.6 oz (124.8 kg)  05/30/23 274 lb 12 oz (124.6 kg)      Physical Exam Constitutional:      Appearance: Normal appearance. She is obese.  HENT:     Head: Normocephalic.  Cardiovascular:     Rate and Rhythm: Normal rate and regular rhythm.  Pulmonary:     Effort: Pulmonary effort is normal.     Breath sounds: Normal breath sounds.  Musculoskeletal:     Cervical back: Normal range of motion and neck supple.     Right lower leg: No edema.     Left lower leg: No edema.  Neurological:     General: No focal deficit present.     Mental Status: She is alert and oriented to person, place, and time.  Psychiatric:         Mood and Affect: Mood normal.      Results for orders placed or performed in visit on 12/22/23  POCT HgB A1C  Result Value Ref Range   Hemoglobin A1C 5.3 4.0 - 5.6 %   HbA1c POC (<> result, manual entry)     HbA1c, POC (prediabetic range)     HbA1c, POC (controlled diabetic range)       The 10-year ASCVD risk score (Arnett DK, et al., 2019) is: 2.2%    Assessment & Plan:  Paula Fitzgerald was seen today for medical management of chronic issues.  Diagnoses and all orders for this visit:  Pre-diabetes -     POCT HgB A1C -     Semaglutide  (RYBELSUS ) 14 MG TABS; Take 1 tablet (14 mg total) by mouth daily.  Menopausal syndrome (hot flashes)  Primary hypertension -     valsartan -hydrochlorothiazide  (DIOVAN -HCT) 320-25 MG tablet; Take 1 tablet by mouth daily.  Motion sickness, subsequent encounter -     scopolamine  (TRANSDERM-SCOP) 1 MG/3DAYS; Place 1 patch (1.5 mg total) onto the skin every 3 (three) days.  Anxiety -     clonazePAM  (KLONOPIN ) 0.5  MG tablet; Take 1 tablet (0.5 mg total) by mouth 2 (two) times daily as needed for anxiety.  Class 3 severe obesity due to excess calories with serious comorbidity and body mass index (BMI) of 45.0 to 49.9 in adult -     Semaglutide  (RYBELSUS ) 14 MG TABS; Take 1 tablet (14 mg total) by mouth daily.  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    Paula Wandrey, PA-C

## 2023-12-22 NOTE — Patient Instructions (Signed)
 Clonazepam as needed for anxiety Patches for motion sickness Increased rybelsus  to 14mg  daily.

## 2023-12-25 ENCOUNTER — Encounter: Payer: Self-pay | Admitting: Physician Assistant

## 2024-02-09 ENCOUNTER — Emergency Department (HOSPITAL_BASED_OUTPATIENT_CLINIC_OR_DEPARTMENT_OTHER)

## 2024-02-09 ENCOUNTER — Other Ambulatory Visit: Payer: Self-pay

## 2024-02-09 ENCOUNTER — Ambulatory Visit
Admission: RE | Admit: 2024-02-09 | Discharge: 2024-02-09 | Disposition: A | Discharge status: Home / Self Care | Source: Ambulatory Visit | Attending: Family Medicine | Admitting: Family Medicine

## 2024-02-09 ENCOUNTER — Encounter (HOSPITAL_BASED_OUTPATIENT_CLINIC_OR_DEPARTMENT_OTHER): Payer: Self-pay

## 2024-02-09 ENCOUNTER — Encounter (HOSPITAL_BASED_OUTPATIENT_CLINIC_OR_DEPARTMENT_OTHER): Payer: Self-pay | Admitting: Emergency Medicine

## 2024-02-09 ENCOUNTER — Emergency Department (HOSPITAL_BASED_OUTPATIENT_CLINIC_OR_DEPARTMENT_OTHER)
Admission: EM | Admit: 2024-02-09 | Discharge: 2024-02-09 | Disposition: A | Discharge status: Home / Self Care | Source: Ambulatory Visit

## 2024-02-09 VITALS — BP 143/94 | HR 74 | Temp 98.2°F | Resp 19

## 2024-02-09 DIAGNOSIS — R519 Headache, unspecified: Secondary | ICD-10-CM

## 2024-02-09 DIAGNOSIS — Z79899 Other long term (current) drug therapy: Secondary | ICD-10-CM | POA: Diagnosis not present

## 2024-02-09 DIAGNOSIS — Z7982 Long term (current) use of aspirin: Secondary | ICD-10-CM | POA: Insufficient documentation

## 2024-02-09 DIAGNOSIS — I10 Essential (primary) hypertension: Secondary | ICD-10-CM | POA: Diagnosis not present

## 2024-02-09 DIAGNOSIS — R202 Paresthesia of skin: Secondary | ICD-10-CM | POA: Insufficient documentation

## 2024-02-09 DIAGNOSIS — J45909 Unspecified asthma, uncomplicated: Secondary | ICD-10-CM | POA: Insufficient documentation

## 2024-02-09 DIAGNOSIS — R200 Anesthesia of skin: Secondary | ICD-10-CM | POA: Diagnosis not present

## 2024-02-09 LAB — TROPONIN T, HIGH SENSITIVITY
Troponin T High Sensitivity: <15 ng/L (ref <19)
Troponin T High Sensitivity: <15 ng/L (ref <19)

## 2024-02-09 LAB — COMPREHENSIVE METABOLIC PANEL WITH GFR
ALT: 12 U/L (ref 0–44)
AST: 16 U/L (ref 15–41)
Albumin: 4 g/dL (ref 3.5–5.0)
Alkaline Phosphatase: 74 U/L (ref 38–126)
Anion gap: 10 (ref 5–15)
BUN: 14 mg/dL (ref 6–20)
CO2: 26 mmol/L (ref 22–32)
Calcium: 9.4 mg/dL (ref 8.9–10.3)
Chloride: 103 mmol/L (ref 98–111)
Creatinine, Ser: 0.88 mg/dL (ref 0.44–1.00)
GFR, Estimated: >60 mL/min (ref >60)
Glucose, Bld: 87 mg/dL (ref 70–99)
Potassium: 3.8 mmol/L (ref 3.5–5.1)
Sodium: 140 mmol/L (ref 135–145)
Total Bilirubin: 0.8 mg/dL (ref 0.0–1.2)
Total Protein: 6.8 g/dL (ref 6.5–8.1)

## 2024-02-09 LAB — CBC
HCT: 40.1 % (ref 36.0–46.0)
Hemoglobin: 14 g/dL (ref 12.0–15.0)
MCH: 31.7 pg (ref 26.0–34.0)
MCHC: 34.9 g/dL (ref 30.0–36.0)
MCV: 90.7 fL (ref 80.0–100.0)
Platelets: 201 K/uL (ref 150–400)
RBC: 4.42 MIL/uL (ref 3.87–5.11)
RDW: 13.5 % (ref 11.5–15.5)
WBC: 6.2 K/uL (ref 4.0–10.5)
nRBC: 0 % (ref 0.0–0.2)

## 2024-02-09 MED ORDER — NAPROXEN SODIUM 550 MG PO TABS: 550.0000 mg | ORAL_TABLET | Freq: Every day | ORAL | 0 refills | Status: AC | PRN

## 2024-02-09 MED ORDER — PROCHLORPERAZINE MALEATE 10 MG PO TABS: 10.0000 mg | ORAL_TABLET | Freq: Two times a day (BID) | ORAL | 0 refills | Status: AC | PRN

## 2024-02-09 MED ORDER — PROCHLORPERAZINE EDISYLATE 10 MG/2ML IJ SOLN
10.0000 mg | Freq: Once | INTRAMUSCULAR | Status: AC
  Administered 2024-02-09: 10 mg via INTRAVENOUS
  Filled 2024-02-09: qty 2

## 2024-02-09 MED ORDER — DIAZEPAM 5 MG/ML IJ SOLN
2.5000 mg | Freq: Once | INTRAMUSCULAR | Status: AC
  Administered 2024-02-09: 2.5 mg via INTRAVENOUS
  Filled 2024-02-09: qty 2

## 2024-02-09 MED ORDER — SODIUM CHLORIDE 0.9 % IV BOLUS
1000.0000 mL | Freq: Once | INTRAVENOUS | Status: AC
  Administered 2024-02-09: 1000 mL via INTRAVENOUS

## 2024-02-09 MED ORDER — KETOROLAC TROMETHAMINE 30 MG/ML IJ SOLN
15.0000 mg | Freq: Once | INTRAMUSCULAR | Status: AC
  Administered 2024-02-09: 15 mg via INTRAVENOUS
  Filled 2024-02-09: qty 1

## 2024-02-09 MED ORDER — IOHEXOL 350 MG/ML SOLN
75.0000 mL | Freq: Once | INTRAVENOUS | Status: AC | PRN
  Administered 2024-02-09: 75 mL via INTRAVENOUS

## 2024-02-09 NOTE — Discharge Instructions (Addendum)
 Instructions After Your Emergency Room Visit for Severe Headache including medication administration directions  You were evaluated in the emergency room for a severe headache. Your heart and blood vessels in your head and neck were checked with advanced imaging (CT angiogram), and no dangerous blockages or bleeding were found. There is some mild buildup of cholesterol plaque in the arteries of your brain, but this is not causing any major narrowing or blockage. What Was Found  No signs of stroke, aneurysm, or major blood vessel blockage.  Mild atherosclerotic plaque (cholesterol buildup) in the arteries inside your head, but no significant narrowing. What to Do Next 1. Headache Management  If you have another severe headache, you may use the medication I have prescribed as follows: 1) take 1/2 tablet of Prochlorperazine (5mg ) and 1/2 tablet of Naproxen  (225 mg) at the onset of headache   2)If no better you may repeat in 1 -2 hours. Do not take more than 550mg  of Naproxen  in a 24 hour period.  If your headaches are frequent or disabling, follow up with your primary care provider or a neurologist to discuss preventive treatments. There are several options, including certain blood pressure medicines, antidepressants, and anti-seizure medications that can help prevent headaches if needed.[1]  If you develop a sudden, severe headache (worst headache of your life), weakness, trouble speaking, vision changes, or loss of consciousness, seek emergency care immediately. 2. Vascular Risk Reduction  Blood Pressure: Keep your blood pressure under control. The goal is usually less than 140/90 mm Hg, or lower if you have diabetes or other risk factors.  Cholesterol: Statin medications (like atorvastatin  or simvastatin) are recommended to lower cholesterol and reduce the risk of stroke and heart attack, even if your cholesterol is not very high.[  Smoking: If you smoke, quitting is one of the most important  things you can do for your health.  Diabetes: If you have diabetes, keep your blood sugar well controlled.  Diet and Exercise: Eat a healthy diet, maintain a healthy weight, and aim for regular physical activity (at least 3-4 times per week).  Aspirin: Daily aspirin is not always needed for people with mild plaque and no symptoms, but your doctor may recommend it based on your overall risk. Do not start aspirin unless your doctor tells you to.[ 3. Follow-Up  Schedule a follow-up visit with your primary care provider within 1-2 weeks to review your test results and discuss your risk factors and any further treatment.  If you have new or worsening symptoms (such as weakness, numbness, trouble speaking, vision loss, or confusion), call 911 or go to the emergency room right away. When to Seek Immediate Medical Attention  Sudden, severe headache unlike any you have had before  Weakness or numbness in your face, arm, or leg (especially on one side)  Trouble speaking or understanding speech  Sudden vision loss or double vision  Loss of balance or coordination  Fainting or loss of consciousness Summary Your tests did not show any dangerous cause for your headache. You do have mild cholesterol buildup in your brain arteries, which is common as people get older. The best way to prevent future problems is to control your blood pressure, cholesterol, and other risk factors. Please follow up with your doctor soon to review your care plan and discuss any questions. If you have any new or concerning symptoms, seek medical attention right away.

## 2024-02-09 NOTE — ED Provider Notes (Signed)
 [facility] Provider Note   CSN: [id number] Arrival date & time: 02/09/24  1208     Patient presents with: Dizziness and Hypertension   Paula Fitzgerald is a 48 y.o. female who presents to the emergency department with chief complaint of headache and arm pain.  She has a past medical history of hypertension, obesity, prediabetes.  Patient reports she had onset of a headache for the past several days.  She reports a headache behind her eyes.  Yesterday she had sudden onset of severe pain in her left arm which she describes as aching and having tingling in her arm.  She states that at the time that she had onset of the arm pain her headache went away.  She stood up to go to the bathroom and her headache suddenly came back.  She describes a wave of pain from the left crossing over to the right and then setting saddling centrally behind her eyes.  She states that the pain was overwhelming.  She took her blood pressure noted that was elevated.  Has been elevated for the past several days after returning from a cruise to [place] with her mother.  She reports having some nausea and some dizziness.  She vertigo but this was different when she woke this morning she states the room felt off like previous vertigo which made her make an appointment at urgent care.  She was sent in by urgent care for further evaluation.  She has persistent left-sided arm pain.  She denies any weakness, vision changes, ataxia.  She states that earlier she was having some left-sided facial numbness which has resolved.  She has had a persistent headache for the past 4 days without relief using Tylenol .  She denies chest pain or shortness of breath.    Dizziness Hypertension       Prior to Admission medications   Medication Sig Start Date End Date Taking? Authorizing Provider  acetaminophen  (TYLENOL ) 500 MG tablet Take 1,000 mg by mouth every 6 (six) hours as needed (for  pain).    [provider]  albuterol  (VENTOLIN  HFA) 108 (90 Base) MCG/ACT inhaler Inhale 2 puffs into the lungs every 6 (six) hours as needed. 07/13/22   Breeback, Jade L, PA-C  aspirin-acetaminophen -caffeine (PAMPRIN MAX) 250-250-65 MG tablet Take 1 tablet by mouth every 6 (six) hours as needed (for pain).    [provider]  atorvastatin  (LIPITOR) 40 MG tablet Take 1 tablet (40 mg total) by mouth daily. 06/14/23   Breeback, Jade L, PA-C  clonazePAM  (KLONOPIN ) 0.5 MG tablet Take 1 tablet (0.5 mg total) by mouth 2 (two) times daily as needed for anxiety. 12/22/23   Breeback, Jade L, PA-C  methocarbamol  (ROBAXIN ) 500 MG tablet Take 1 tablet (500 mg total) by mouth 3 (three) times daily. 05/30/23   Breeback, Jade L, PA-C  Multiple Vitamin (QUINTABS) TABS Take 1 tablet by mouth daily.    [provider]  naproxen  (NAPROSYN ) 500 MG tablet Take 1 tablet (500 mg total) by mouth 2 (two) times daily with a meal. 11/27/23   Raspet, Erin K, PA-C  polyethylene glycol (MIRALAX  / GLYCOLAX ) 17 g packet Take 17 g by mouth daily as needed for mild constipation or moderate constipation. 03/07/20   Augustus Almarie RAMAN, PA-C  scopolamine  (TRANSDERM-SCOP) 1 MG/3DAYS Place 1 patch (1.5 mg total) onto the skin every 3 (three) days. 12/22/23   Breeback, Jade L, PA-C  Semaglutide  (RYBELSUS ) 14 MG TABS Take  1 tablet (14 mg total) by mouth daily. 12/22/23   Breeback, Jade L, PA-C  valsartan -hydrochlorothiazide  (DIOVAN -HCT) 320-25 MG tablet Take 1 tablet by mouth daily. 12/22/23   Breeback, Vermell CROME, PA-C    Allergies: Doxycycline  and Other    Review of Systems  Neurological:  Positive for dizziness.    Updated Vital Signs BP (!) 148/100 (BP Location: Right Arm)   Pulse 79   Temp 98.7 F (37.1 C)   Resp 18   Ht 5' 3 (1.6 m)   Wt 123.8 kg   LMP 02/07/2024   SpO2 99%   BMI 48.36 kg/m   Physical Exam Physical Exam  Constitutional: Pt is oriented to person, place, and time. Pt appears  well-developed and well-nourished. No distress.  HENT:  Head: Normocephalic and atraumatic.  Mouth/Throat: Oropharynx is clear and moist.  Eyes: Conjunctivae and EOM are normal. Pupils are equal, round, and reactive to light. No scleral icterus.  No horizontal, vertical or rotational nystagmus  Neck: Normal range of motion. Neck supple.  Full active and passive ROM without pain No midline or paraspinal tenderness No nuchal rigidity or meningeal signs  Cardiovascular: Normal rate, regular rhythm and intact distal pulses.   Pulmonary/Chest: Effort normal and breath sounds normal. No respiratory distress. Pt has no wheezes. No rales.  Abdominal: Soft. Bowel sounds are normal. There is no tenderness. There is no rebound and no guarding.  Musculoskeletal: Normal range of motion.  Lymphadenopathy:    No cervical adenopathy.  Neurological: Pt. is alert and oriented to person, place, and time. He has normal reflexes. No cranial nerve deficit.  Exhibits normal muscle tone. Coordination normal.  Mental Status:  Alert, oriented, thought content appropriate. Speech fluent without evidence of aphasia. Able to follow 2 step commands without difficulty.  Cranial Nerves:  II:  Peripheral visual fields grossly normal, pupils equal, round, reactive to light III,IV, VI: ptosis not present, extra-ocular motions intact bilaterally  V,VII: smile symmetric, facial light touch sensation equal VIII: hearing grossly normal bilaterally  IX,X: midline uvula rise  XI: bilateral shoulder shrug equal and strong XII: midline tongue extension  Motor:  5/5 in upper and lower extremities bilaterally including strong and equal grip strength and dorsiflexion/plantar flexion Sensory: Pinprick and light touch normal in all extremities.  Deep Tendon Reflexes: 2+ and symmetric  Cerebellar: normal finger-to-nose with bilateral upper extremities Gait: normal gait and balance CV: distal pulses palpable throughout   Skin: Skin  is warm and dry. No rash noted. Pt is not diaphoretic.  Psychiatric: Pt has a normal mood and affect. Behavior is normal. Judgment and thought content normal.  Nursing note and vitals reviewed.  (all labs ordered are listed, but only abnormal results are displayed) Labs Reviewed  COMPREHENSIVE METABOLIC PANEL WITH GFR  CBC  TROPONIN T, HIGH SENSITIVITY    EKG: EKG Interpretation Date/Time:  Friday February 09 2024 12:19:20 EDT Ventricular Rate:  82 PR Interval:  159 QRS Duration:  111 QT Interval:  392 QTC Calculation: 458 R Axis:   66  Text Interpretation: Sinus rhythm Low voltage, precordial leads Borderline T abnormalities, anterior leads Baseline wander in lead(s) II III aVR aVF V3 V5 Confirmed by Neysa Clap [phone]) on 02/09/2024 1:22:02 PM  Radiology: DG Chest 2 View Result Date: 02/09/2024 CLINICAL DATA:  Chest pain EXAM: CHEST - 2 VIEW COMPARISON:  Chest radiograph Nov 30, 2022 FINDINGS: The heart size is borderline normal. Mediastinal contours are within normal limits. Both lungs are clear. The visualized skeletal structures  are unremarkable. Contrast within the collecting system likely from recent CT angiogram. No active cardiopulmonary disease. IMPRESSION: Borderline heart size.  No acute cardiopulmonary process . Electronically Signed   By: Megan  Zare M.D.   On: 02/09/2024 15:09   CT ANGIO HEAD NECK W WO CM Result Date: 02/09/2024 CLINICAL DATA:  Provided history: Vertigo, central. EXAM: CT ANGIOGRAPHY HEAD AND NECK WITH AND WITHOUT CONTRAST TECHNIQUE: Multidetector CT imaging of the head and neck was performed using the standard protocol during bolus administration of intravenous contrast. Multiplanar CT image reconstructions and MIPs were obtained to evaluate the vascular anatomy. Carotid stenosis measurements (when applicable) are obtained utilizing NASCET criteria, using the distal internal carotid diameter as the denominator. RADIATION DOSE REDUCTION: This exam was  performed according to the departmental dose-optimization program which includes automated exposure control, adjustment of the mA and/or kV according to patient size and/or use of iterative reconstruction technique. CONTRAST:  75mL OMNIPAQUE  IOHEXOL  350 MG/ML SOLN COMPARISON:  None. FINDINGS: CT HEAD FINDINGS Brain: Cerebral volume is normal. There is no acute intracranial hemorrhage. No demarcated cortical infarct. No extra-axial fluid collection. No evidence of an intracranial mass. No midline shift. Vascular: No hyperdense vessel.  Atherosclerotic calcifications. Skull: No calvarial fracture or aggressive osseous lesion. Sinuses/Orbits: No orbital mass or acute orbital finding. 2.6 cm left maxillary sinus mucous retention cyst. Minimal mucosal thickening within the left maxillary, right sphenoid and bilateral ethmoid sinuses. Review of the MIP images confirms the above findings CTA NECK FINDINGS Aortic arch: Standard aortic branching. Visualized portions of the thoracic aorta are normal in caliber. Streak/beam hardening artifact arising from a dense contrast bolus partially obscures the right subclavian artery. Within this limitation, there is no appreciable hemodynamically significant innominate or proximal subclavian artery stenosis. Right carotid system: CCA and ICA patent within the neck without stenosis or significant atherosclerotic disease partially retropharyngeal course of the cervical ICA. Left carotid system: CCA and ICA patent within the neck without stenosis. Mild sclerotic plaque within the proximal ICA. Vertebral arteries: Codominant and patent within the neck without stenosis or significant atherosclerotic disease. Skeleton: No acute fracture or aggressive osseous lesion. Other neck: No neck mass or cervical lymphadenopathy. Upper chest: No consolidation within the imaged lung apices. Review of the MIP images confirms the above findings CTA HEAD FINDINGS Anterior circulation: The intracranial  internal carotid arteries are patent. The anterior cerebral arteries are patent. No intracranial aneurysm is identified. Mild atherosclerotic plaque within both vessels with no more than mild stenosis. The M1 middle cerebral arteries are patent. No M2 proximal branch occlusion or high-grade proximal stenosis. The anterior cerebral arteries are patent. No intracranial aneurysm is identified. Posterior circulation: The intracranial vertebral arteries are patent. The basilar artery is patent. The posterior cerebral arteries are patent. Posterior communicating arteries are present bilaterally. Venous sinuses: Assessment for dural venous sinus thrombosis is limited due to contrast timing. Anatomic variants: None significant. Review of the MIP images confirms the above findings IMPRESSION: Non-contrast head CT: 1.  No evidence of an acute intracranial abnormality. 2. Paranasal sinus disease as described. CTA head: The common carotid, internal carotid and vertebral arteries are patent within neck without stenosis. No evidence of dissection. Mild atherosclerotic plaque within the proximal left internal carotid artery. CTA neck: 1. No proximal intracranial large vessel occlusion or high-grade proximal arterial stenosis identified. 2. Atherosclerotic plaque within the intracranial internal carotid arteries with no more than mild stenosis. Electronically Signed   By: Rockey Childs D.O.   On: 02/09/2024 15:04  Procedures   Medications Ordered in the ED  diazepam  (VALIUM ) injection 2.5 mg (has no administration in time range)  prochlorperazine  (COMPAZINE ) injection 10 mg (has no administration in time range)  ketorolac  (TORADOL ) 30 MG/ML injection 15 mg (has no administration in time range)  sodium chloride  0.9 % bolus 1,000 mL (has no administration in time range)                                    Medical Decision Making Amount and/or Complexity of Data Reviewed Labs: ordered. Radiology:  ordered. ECG/medicine tests: ordered.  Risk Prescription drug management.   This patient presents to the ED for concern of bad headache, left arm pain and paresthesia and hypertension., this involves an extensive number of treatment options, and is a complaint that carries with it a high risk of complications and morbidity.  Emergent considerations for headache include subarachnoid hemorrhage, meningitis, temporal arteritis, glaucoma, cerebral ischemia, carotid/vertebral dissection, intracranial tumor, Venous sinus thrombosis, carbon monoxide poisoning, acute or chronic subdural hemorrhage.  Other considerations include: Migraine, Cluster headache, Hypertension, Caffeine, alcohol, or drug withdrawal, Pseudotumor cerebri, Arteriovenous malformation, Head injury, Neurocysticercosis, Post-lumbar puncture, Preeclampsia, Tension headache, Sinusitis, Cervical arthritis, Refractive error causing strain, Dental abscess, Otitis media, Temporomandibular joint syndrome, Depression, Somatoform disorder (eg, somatization) Trigeminal neuralgia, Glossopharyngeal neuralgia. Differential of arm pain includes atypical migraine, muscle strain, ACS  Co morbidities:   has a past medical history of Asthma, Bipolar 1 disorder (HCC), Depressed, Diverticulitis (03/03/2020), Hypertension, Obesity, and Panic attacks.   Social Determinants of Health:   SDOH Screenings   Food Insecurity: No Food Insecurity (12/22/2023)  Housing: Low Risk  (12/22/2023)  Transportation Needs: No Transportation Needs (12/22/2023)  Alcohol Screen: Low Risk  (12/22/2023)  Depression (PHQ2-9): Low Risk  (05/27/2022)  Financial Resource Strain: Medium Risk (12/22/2023)  Physical Activity: Unknown (12/22/2023)  Social Connections: Socially Integrated (12/22/2023)  Stress: Stress Concern Present (12/22/2023)  Tobacco Use: Medium Risk (02/09/2024)     Additional history:  {Additional history obtained from family at bedside {External records from outside  source obtained and reviewed including Recent UC visit  Lab Tests:  I Ordered, and personally interpreted labs.  The pertinent results include:   Labs show no abnormalities including 2 negative troponins  Imaging Studies:  I ordered imaging studies including two-view chest x-ray and CT angiogram of the head and neck I independently visualized and interpreted imaging which showed borderline cardiomegaly without any other abnormalities on chest x-ray, CT angiogram shows mild atheromatous development without any stenosis of the vessels, no evidence of infarct or mass, no empty sella turcica suggestive of IIH I agree with the radiologist interpretation  Cardiac Monitoring/ECG:  The patient was maintained on a cardiac monitor.  I personally viewed and interpreted the cardiac monitored which showed an underlying rhythm of:  EKG shows normal sinus rhythm at a rate of 82 Medicines ordered and prescription drug management:  I ordered medication including  Medications  diazepam  (VALIUM ) injection 2.5 mg (2.5 mg Intravenous Given 02/09/24 1329)  prochlorperazine  (COMPAZINE ) injection 10 mg (10 mg Intravenous Given 02/09/24 1331)  ketorolac  (TORADOL ) 30 MG/ML injection 15 mg (15 mg Intravenous Given 02/09/24 1325)  sodium chloride  0.9 % bolus 1,000 mL (0 mLs Intravenous Stopped 02/09/24 1522)  iohexol  (OMNIPAQUE ) 350 MG/ML injection 75 mL (75 mLs Intravenous Contrast Given 02/09/24 1411)   for headache left arm discomfort Reevaluation of the patient after these medicines showed that  the patient resolved stating this is the best I felt in days. I have reviewed the patients home medicines and have made adjustments as needed  Test Considered:   MRI however patient has no objective findings on her scan.  I have low suspicion for infarct  Critical Interventions:    Consultations Obtained:   Problem List / ED Course:     ICD-10-CM   1. Cellulitis of left lower extremity  L03.116        MDM: Patient here with bad headache for the past several days associated left arm paresthesia and pain.  After review of all data points patient does not appear to have vertebral artery dissection, brain mass, brain infarct, IIH, or ACS.  She was treated in the emergency department with Compazine , Valium  and Toradol  with complete resolution of her pain and headache.  She has no objective neurologic deficits on exam.  She has no concerning symptoms for subarachnoid hemorrhage.  After treatment here in the emergency department I believe patient may be having migraine type headache and need close outpatient follow-up with PCP and if continuing to have headaches follow-up with neurology.  Patient will be discharged with Compazine  and naproxen  with strict instructions on how to use it for treatment of bad headache.  Her blood pressure improved significantly with pain control.  Discussed outpatient follow-up and strict return precautions.   Dispostion:  After consideration of the diagnostic results and the patients response to treatment, I feel that the patent would benefit from discharge.      Final diagnoses:  Bad headache  Arm paresthesia, left    ED Discharge Orders     None          Arloa Chroman, PA-C 02/09/24 1737    Neysa Caron PARAS, DO 02/11/24 [phone]

## 2024-02-09 NOTE — ED Triage Notes (Signed)
 Pt presents to [facility] with co migraine since Monday. Reports she had recent travel on a cruise. Pt reports she has taken tylenol  for headache with no improvement. Pt reports she has missed her bp meds due to the pain. New onset of dizziness and left arm pain since last night. She took her blood pressure medications and drank water thinking that would help.

## 2024-02-09 NOTE — ED Notes (Signed)
 Patient is being discharged from the Urgent Care and sent to the Emergency Department via POV with husband . Per Ragan Np, patient is in need of higher level of care due to HTN, Headache, arm numbness. Patient is aware and verbalizes understanding of plan of care.  Vitals:   02/09/24 1112  BP: (!) 143/94  Pulse: 74  Resp: 19  Temp: 98.2 F (36.8 C)  SpO2: 98%

## 2024-02-09 NOTE — ED Notes (Signed)

## 2024-02-09 NOTE — ED Triage Notes (Signed)
 Pt POV in wheelchair- c/o severe intermittent headache x 4 days, elevated BP x 4 days after missing couple BP med doses, sharp intermittent L arm pain since yesterday.  Dizziness since this AM, appx 0745. Hx of vertigo.  C/o nausea, denies emesis. Pt tearful in triage.

## 2024-02-09 NOTE — Discharge Instructions (Addendum)
 Advised patient/husband go to [facility] ED now for further evaluation of headache, elevated blood pressure, and left arm numbness.

## 2024-02-09 NOTE — ED Provider Notes (Signed)
 [facility]    CSN: [id number] Arrival date & time: 02/09/24  1102      History   Chief Complaint Chief Complaint  Patient presents with   Headache    HPI Paula Fitzgerald is a 48 y.o. female.   HPI 48 year old female presents with high blood pressure for 5 days.  Patient reports having high blood pressure while on travel cruise.  Patient reports taking Tylenol  for headache with no improvement.  Reports she missed her blood pressure medicines due to headache pain.  Patient reports new onset of dizziness and left arm pain since last night.  Patient is accompanied by her husband today.  PMH significant for morbid/severe obesity, HTN, and bipolar 1 disorder.  Past Medical History:  Diagnosis Date   Asthma    Bipolar 1 disorder (HCC)    Depressed    Diverticulitis 03/03/2020   Hypertension    Obesity    Panic attacks     Patient Active Problem List   Diagnosis Date Noted   Acute left-sided low back pain with left-sided sciatica 05/30/2023   Menopausal syndrome (hot flashes) 11/30/2022   Hyperlipidemia 05/30/2022   Thyroid disorder screening 05/30/2022   Tetrahydrocannabinol (THC) dependence (HCC) 09/15/2021   History of diverticulitis 05/13/2021   Recurrent low back pain 09/28/2020   Gastroesophageal reflux disease without esophagitis 09/28/2020   Diverticulitis of colon with perforation 03/03/2020   Pre-diabetes 02/27/2020   Class 3 severe obesity due to excess calories with serious comorbidity and body mass index (BMI) of 45.0 to 49.9 in adult 10/06/2015   Asthma 10/06/2015   Severe uncontrolled hypertension 07/11/2011   Morbid obesity (HCC) 07/11/2011   PNA (pneumonia) 07/04/2011   OBSTRUCTIVE SLEEP APNEA 01/22/2010   BIPOLAR DISORDER UNSPECIFIED 12/23/2009   Primary hypertension 12/23/2009   Asthma 12/23/2009    Past Surgical History:  Procedure Laterality Date   SIGMOIDECTOMY     TUBAL LIGATION      OB History   No obstetric history  on file.      Home Medications    Prior to Admission medications   Medication Sig Start Date End Date Taking? Authorizing Provider  acetaminophen  (TYLENOL ) 500 MG tablet Take 1,000 mg by mouth every 6 (six) hours as needed (for pain).    [provider]  albuterol  (VENTOLIN  HFA) 108 (90 Base) MCG/ACT inhaler Inhale 2 puffs into the lungs every 6 (six) hours as needed. 07/13/22   Breeback, Jade L, PA-C  aspirin-acetaminophen -caffeine (PAMPRIN MAX) 250-250-65 MG tablet Take 1 tablet by mouth every 6 (six) hours as needed (for pain).    [provider]  atorvastatin  (LIPITOR) 40 MG tablet Take 1 tablet (40 mg total) by mouth daily. 06/14/23   Breeback, Jade L, PA-C  clonazePAM  (KLONOPIN ) 0.5 MG tablet Take 1 tablet (0.5 mg total) by mouth 2 (two) times daily as needed for anxiety. 12/22/23   Breeback, Jade L, PA-C  methocarbamol  (ROBAXIN ) 500 MG tablet Take 1 tablet (500 mg total) by mouth 3 (three) times daily. 05/30/23   Breeback, Jade L, PA-C  Multiple Vitamin (QUINTABS) TABS Take 1 tablet by mouth daily.    [provider]  naproxen  (NAPROSYN ) 500 MG tablet Take 1 tablet (500 mg total) by mouth 2 (two) times daily with a meal. 11/27/23   Raspet, Erin K, PA-C  polyethylene glycol (MIRALAX  / GLYCOLAX ) 17 g packet Take 17 g by mouth daily as needed for mild constipation or moderate constipation. 03/07/20   Simaan, Elizabeth S, PA-C  scopolamine  (TRANSDERM-SCOP) 1 MG/3DAYS Place 1 patch (1.5 mg total) onto the skin every 3 (three) days. 12/22/23   Breeback, Jade L, PA-C  Semaglutide  (RYBELSUS ) 14 MG TABS Take 1 tablet (14 mg total) by mouth daily. 12/22/23   Breeback, Jade L, PA-C  valsartan -hydrochlorothiazide  (DIOVAN -HCT) 320-25 MG tablet Take 1 tablet by mouth daily. 12/22/23   Antoniette Vermell CROME, PA-C    Family History Family History  Problem Relation Age of Onset   Hypertension Mother    Hypertension Father    Heart attack Father    Breast cancer Maternal Aunt    Stroke  Paternal Grandmother     Social History Social History   Tobacco Use   Smoking status: Former    Current packs/day: 0.00    Types: Cigarettes    Quit date: 10/02/2018    Years since quitting: 5.3   Smokeless tobacco: Never  Vaping Use   Vaping status: Never Used  Substance Use Topics   Alcohol use: Yes    Comment: sociallly   Drug use: Yes    Frequency: 3.0 times per week    Types: Marijuana    Comment: some days     Allergies   Doxycycline  and Other   Review of Systems Review of Systems   Physical Exam Triage Vital Signs ED Triage Vitals  Encounter Vitals Group     BP      Girls Systolic BP Percentile      Girls Diastolic BP Percentile      Boys Systolic BP Percentile      Boys Diastolic BP Percentile      Pulse      Resp      Temp      Temp src      SpO2      Weight      Height      Head Circumference      Peak Flow      Pain Score      Pain Loc      Pain Education      Exclude from Growth Chart    No data found.  Updated Vital Signs BP (!) 143/94   Pulse 74   Temp 98.2 F (36.8 C)   Resp 19   LMP 02/07/2024   SpO2 98%    Physical Exam Vitals and nursing note reviewed.  Constitutional:      Appearance: Normal appearance. She is obese.  HENT:     Head: Normocephalic and atraumatic.     Right Ear: Tympanic membrane, ear canal and external ear normal.     Left Ear: Tympanic membrane, ear canal and external ear normal.     Mouth/Throat:     Mouth: Mucous membranes are moist.     Pharynx: Oropharynx is clear.  Eyes:     Extraocular Movements: Extraocular movements intact.     Conjunctiva/sclera: Conjunctivae normal.     Pupils: Pupils are equal, round, and reactive to light.  Cardiovascular:     Rate and Rhythm: Normal rate and regular rhythm.     Pulses: Normal pulses.     Heart sounds: Normal heart sounds.  Pulmonary:     Effort: Pulmonary effort is normal.     Breath sounds: Normal breath sounds. No wheezing, rhonchi or rales.   Musculoskeletal:        General: Normal range of motion.     Cervical back: Normal range of motion and neck supple.  Skin:    General: Skin  is warm and dry.  Neurological:     General: No focal deficit present.     Mental Status: She is alert and oriented to person, place, and time. Mental status is at baseline.     Cranial Nerves: No cranial nerve deficit.     Sensory: No sensory deficit.     Motor: No weakness.     Coordination: Coordination normal.     Gait: Gait normal.  Psychiatric:        Mood and Affect: Mood normal.        Behavior: Behavior normal.     Comments: Patient became tearful when I advised that she should go to ED for further evaluation of head including CT      UC Treatments / Results  Labs (all labs ordered are listed, but only abnormal results are displayed) Labs Reviewed - No data to display  EKG   Radiology No results found.  Procedures Procedures (including critical care time)  Medications Ordered in UC Medications - No data to display  Initial Impression / Assessment and Plan / UC Course  I have reviewed the triage vital signs and the nursing notes.  Pertinent labs & imaging results that were available during my care of the patient were reviewed by me and considered in my medical decision making (see chart for details).     MDM: 1.  Headache disorder-left arm numbness-Advised patient/husband go to [facility] ED now for further evaluation of headache, elevated blood pressure, and left arm numbness.  Patient/husband agreed and verbalized understanding of these instructions and this plan of care today. 2.  Left arm numbness-EKG revealed normal sinus rhythm; 3.  Essential hypertension-BP within normal limits today at this office visit patient reports missing blood pressure medication on 2 days in a row due to headache pain.  Patient discharged to ED, hemodynamically stable.  Patient's husband will be driving her to [facility] ED now for further evaluation of headache, left arm numbness, and elevated blood pressure.  Patient discharged to ED, hemodynamically stable. Final Clinical Impressions(s) / UC Diagnoses   Final diagnoses:  Headache disorder  Essential hypertension  Left arm numbness     Discharge Instructions      Advised patient/husband go to [facility] ED now for further evaluation of headache, elevated blood pressure, and left arm numbness.     ED Prescriptions   None    PDMP not reviewed this encounter.   Teddy Sharper, FNP 02/09/24 1214

## 2024-03-22 ENCOUNTER — Telehealth (INDEPENDENT_AMBULATORY_CARE_PROVIDER_SITE_OTHER): Admitting: Physician Assistant

## 2024-03-22 ENCOUNTER — Ambulatory Visit: Admitting: Physician Assistant

## 2024-03-22 DIAGNOSIS — J4521 Mild intermittent asthma with (acute) exacerbation: Secondary | ICD-10-CM

## 2024-03-22 DIAGNOSIS — J329 Chronic sinusitis, unspecified: Secondary | ICD-10-CM | POA: Diagnosis not present

## 2024-03-22 DIAGNOSIS — J40 Bronchitis, not specified as acute or chronic: Secondary | ICD-10-CM | POA: Diagnosis not present

## 2024-03-22 MED ORDER — PREDNISONE 20 MG PO TABS: ORAL_TABLET | ORAL | 0 refills | Status: AC

## 2024-03-22 MED ORDER — AIRSUPRA 90-80 MCG/ACT IN AERO: 2.0000 | INHALATION_SPRAY | Freq: Four times a day (QID) | RESPIRATORY_TRACT | 3 refills | Status: AC | PRN

## 2024-03-22 MED ORDER — HYDROCOD POLI-CHLORPHE POLI ER 10-8 MG/5ML PO SUER: 5.0000 mL | Freq: Two times a day (BID) | ORAL | 0 refills | Status: DC | PRN

## 2024-03-22 MED ORDER — AZITHROMYCIN 250 MG PO TABS: ORAL_TABLET | ORAL | 0 refills | Status: AC

## 2024-03-22 NOTE — Progress Notes (Signed)
..  Virtual Visit via Video Note  I connected with Paula Fitzgerald on 03/25/24 at 11:10 AM EDT by a video enabled telemedicine application and verified that I am speaking with the correct person using two identifiers.  Location: Patient: home Provider: clinic  .SABRAParticipating in visit:  Patient: Paula Fitzgerald Provider: Vermell Bologna PA-C   I discussed the limitations of evaluation and management by telemedicine and the availability of in person appointments. The patient expressed understanding and agreed to proceed.  History of Present Illness: Pt is a 48 yo female with asthma who calls into the clinic with over a week of cough, congestion, shortness of breath. She has continued to work but very tired. She has not tested for covid or flu. Pt has not had covid booster or flu shot this year. She is using albuterol  inhaler as needed and OTC mucinex  and sudafed without much help. Her chest does feel tight.      Observations/Objective: No acute distress Coughing on exam Mood normal No labored breathing  No vitals captured today   Assessment and Plan: SABRASABRAShaynna was seen today for medical management of chronic issues.  Diagnoses and all orders for this visit:  Sinobronchitis -     predniSONE  (DELTASONE ) 20 MG tablet; Take 3 tablets for 3 days, take 2 tablets for 3 days, take 1 tablet for 3 days, take 1/2 tablet for 4 days. -     Albuterol -Budesonide (AIRSUPRA ) 90-80 MCG/ACT AERO; Inhale 2 puffs into the lungs every 6 (six) hours as needed. -     azithromycin  (ZITHROMAX  Z-PAK) 250 MG tablet; Take 2 tablets (500 mg) on  Day 1,  followed by 1 tablet (250 mg) once daily on Days 2 through 5. -     chlorpheniramine-HYDROcodone  (TUSSIONEX) 10-8 MG/5ML; Take 5 mLs by mouth every 12 (twelve) hours as needed.  Mild intermittent asthma with acute exacerbation -     predniSONE  (DELTASONE ) 20 MG tablet; Take 3 tablets for 3 days, take 2 tablets for 3 days, take 1 tablet for 3 days, take 1/2  tablet for 4 days. -     Albuterol -Budesonide (AIRSUPRA ) 90-80 MCG/ACT AERO; Inhale 2 puffs into the lungs every 6 (six) hours as needed.   Symptoms for over a week Treated for sinobronchitis and asthma exacerbation with zpak, prednisone , tussionex and airsupra  for rescue Replaced albuterol  with airsupra  for better rescue control Rest and hydrate Written out of work over the weekend to go back Monday Continue to use OTC medications for symptomatic control Follow up as needed if symptoms persist or worsen   Follow Up Instructions:    I discussed the assessment and treatment plan with the patient. The patient was provided an opportunity to ask questions and all were answered. The patient agreed with the plan and demonstrated an understanding of the instructions.   The patient was advised to call back or seek an in-person evaluation if the symptoms worsen or if the condition fails to improve as anticipated.   Clessie Karras, PA-C

## 2024-03-23 ENCOUNTER — Encounter: Payer: Self-pay | Admitting: Physician Assistant

## 2024-03-23 DIAGNOSIS — J329 Chronic sinusitis, unspecified: Secondary | ICD-10-CM

## 2024-03-25 ENCOUNTER — Encounter: Payer: Self-pay | Admitting: Physician Assistant

## 2024-03-25 MED ORDER — HYDROCOD POLI-CHLORPHE POLI ER 10-8 MG/5ML PO SUER: 5.0000 mL | Freq: Two times a day (BID) | ORAL | 0 refills | Status: AC | PRN

## 2024-03-26 ENCOUNTER — Other Ambulatory Visit (HOSPITAL_COMMUNITY): Payer: Self-pay

## 2024-03-26 ENCOUNTER — Telehealth: Payer: Self-pay

## 2024-03-26 NOTE — Telephone Encounter (Signed)
 Pharmacy Patient Advocate Encounter   Received notification from CoverMyMeds that prior authorization for  Chlorpheniramine-Hydrocodone  10-8/41ml is required/requested.   Insurance verification completed.   The patient is insured through [employer] .   Per test claim: Chlorpheniramine-Hydrocodone  10-8mg /84ml is not covered by the plan. Product is not on the formulary.

## 2024-04-16 ENCOUNTER — Telehealth: Payer: Self-pay

## 2024-04-16 ENCOUNTER — Other Ambulatory Visit (HOSPITAL_COMMUNITY): Payer: Self-pay

## 2024-04-16 NOTE — Telephone Encounter (Signed)
 Pharmacy Patient Advocate Encounter   Received notification from CoverMyMeds that prior authorization for Airsupra  90-75mcg is required/requested.   Insurance verification completed.   The patient is insured through [employer].   Per test claim: PA required; PA submitted to above mentioned insurance via Latent Key/confirmation #/EOC [employer] Status is pending

## 2024-04-16 NOTE — Telephone Encounter (Signed)
 Pharmacy Patient Advocate Encounter  Received notification from [employer] that Prior Authorization for Airsupra  90-64mcg has been DENIED.  See denial reason below. No denial letter attached in CMM. Will attach denial letter to Media tab once received.   PA #/Case ID/Reference #: [id number]

## 2024-04-22 ENCOUNTER — Other Ambulatory Visit: Payer: Self-pay | Admitting: Physician Assistant

## 2024-04-22 DIAGNOSIS — E66813 Obesity, class 3: Secondary | ICD-10-CM

## 2024-04-22 DIAGNOSIS — R7303 Prediabetes: Secondary | ICD-10-CM

## 2024-05-28 ENCOUNTER — Telehealth: Payer: Self-pay

## 2024-05-28 DIAGNOSIS — Z1231 Encounter for screening mammogram for malignant neoplasm of breast: Secondary | ICD-10-CM

## 2024-05-28 NOTE — Telephone Encounter (Signed)
 Cleopha agreed to a mammogram.

## 2024-06-24 ENCOUNTER — Ambulatory Visit: Admitting: Physician Assistant

## 2024-07-09 ENCOUNTER — Inpatient Hospital Stay (HOSPITAL_BASED_OUTPATIENT_CLINIC_OR_DEPARTMENT_OTHER): Admission: RE | Admit: 2024-07-09 | Source: Ambulatory Visit
